# Patient Record
Sex: Male | Born: 1995
Health system: Southern US, Community
[De-identification: ages and names within clinical notes are randomized; demographics above are authoritative.]

## PROBLEM LIST (undated history)

## (undated) DIAGNOSIS — T7840XA Allergy, unspecified, initial encounter: Secondary | ICD-10-CM

## (undated) DIAGNOSIS — R002 Palpitations: Secondary | ICD-10-CM

## (undated) DIAGNOSIS — B2790 Infectious mononucleosis, unspecified without complication: Secondary | ICD-10-CM

## (undated) DIAGNOSIS — B20 Human immunodeficiency virus [HIV] disease: Secondary | ICD-10-CM

## (undated) DIAGNOSIS — J351 Hypertrophy of tonsils: Secondary | ICD-10-CM

## (undated) DIAGNOSIS — Z21 Asymptomatic human immunodeficiency virus [HIV] infection status: Secondary | ICD-10-CM

## (undated) DIAGNOSIS — E559 Vitamin D deficiency, unspecified: Secondary | ICD-10-CM

## (undated) HISTORY — DX: Asymptomatic human immunodeficiency virus (hiv) infection status: Z21

## (undated) HISTORY — DX: Human immunodeficiency virus (HIV) disease: B20

## (undated) HISTORY — DX: Hypertrophy of tonsils: J35.1

## (undated) HISTORY — DX: Palpitations: R00.2

## (undated) HISTORY — DX: Vitamin D deficiency, unspecified: E55.9

## (undated) HISTORY — DX: Allergy, unspecified, initial encounter: T78.40XA

## (undated) HISTORY — PX: NO PAST SURGERIES: SHX2092

---
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
## 2016-01-09 DIAGNOSIS — J351 Hypertrophy of tonsils: Secondary | ICD-10-CM | POA: Diagnosis not present

## 2016-01-09 DIAGNOSIS — J301 Allergic rhinitis due to pollen: Secondary | ICD-10-CM | POA: Diagnosis not present

## 2016-04-22 ENCOUNTER — Encounter: Payer: BLUE CROSS/BLUE SHIELD | Admitting: Family Medicine

## 2016-04-25 ENCOUNTER — Encounter: Payer: Self-pay | Admitting: Family Medicine

## 2016-04-25 ENCOUNTER — Ambulatory Visit (INDEPENDENT_AMBULATORY_CARE_PROVIDER_SITE_OTHER): Payer: BLUE CROSS/BLUE SHIELD | Admitting: Family Medicine

## 2016-04-25 VITALS — BP 118/72 | HR 98 | Temp 98.5°F | Resp 20 | Ht 70.0 in | Wt 146.3 lb

## 2016-04-25 DIAGNOSIS — Z113 Encounter for screening for infections with a predominantly sexual mode of transmission: Secondary | ICD-10-CM

## 2016-04-25 DIAGNOSIS — Z23 Encounter for immunization: Secondary | ICD-10-CM | POA: Diagnosis not present

## 2016-04-25 DIAGNOSIS — Z0000 Encounter for general adult medical examination without abnormal findings: Secondary | ICD-10-CM

## 2016-04-25 DIAGNOSIS — Z68.41 Body mass index (BMI) pediatric, 5th percentile to less than 85th percentile for age: Secondary | ICD-10-CM

## 2016-04-25 DIAGNOSIS — Z00129 Encounter for routine child health examination without abnormal findings: Principal | ICD-10-CM

## 2016-04-25 DIAGNOSIS — J351 Hypertrophy of tonsils: Secondary | ICD-10-CM

## 2016-04-25 HISTORY — DX: Hypertrophy of tonsils: J35.1

## 2016-04-25 NOTE — Progress Notes (Signed)
Adolescent Well Care Visit Kurt Simpson is a 20 y.o. male who is here for well care.    PCP:  Dennison MascotLemont Morrisey, MD   History was provided by the patient.  Current Issues: Current concerns include  none.   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  Concerns regarding behavior with peers?  no Stressors of note: no  Education: School Name: Medical laboratory scientific officerophomore in [employer] Fall 2017 [employer]  School Grade: Sophomore School performance: doing well; no concerns School Behavior: doing well; no concerns   Confidentiality was discussed with the patient and, if applicable, with caregiver as well. Patient's personal or confidential phone number: [phone]  Tobacco?  no Secondhand smoke exposure?  no Drugs/ETOH?  no  Sexually Active?  no  But thinking about it Pregnancy Prevention: abstinence.   Safe at home, in school & in relationships?  Yes Safe to self?  Yes   Screenings: Patient has a dental home: yes  The patient completed the Rapid Assessment for Adolescent Preventive Services screening questionnaire and the following topics were identified as risk factors and discussed: condom use  In addition, the following topics were discussed as part of anticipatory guidance healthy eating.  PHQ-9 completed and results indicated   Dep[facility] screen [facility] 2/9 11/17/2015 11/14/2015  Decreased Interest 0 0  Down, Depressed, Hopeless 0 0  PHQ - 2 Score 0 0       Physical Exam:  Filed Vitals:   04/25/16 1244  BP: 118/72  Pulse: 98  Temp: 98.5 F (36.9 C)  Resp: 20  Height: 5\' 10"  (1.778 m)  Weight: 146 lb 5 oz (66.367 kg)  SpO2: 98%   BP 118/72 mmHg  Pulse 98  Temp(Src) 98.5 F (36.9 C)  Resp 20  Ht 5\' 10"  (1.778 m)  Wt 146 lb 5 oz (66.367 kg)  BMI 20.99 kg/m2  SpO2 98% Body mass index: body mass index is 20.99 kg/(m^2). Blood pressure percentiles are 34% systolic and 35% diastolic based on 2000 NHANES data. Blood pressure percentile targets: 90: 136/91, 95: 140/95, 99 + 5 mmHg: 153/108.  No exam data present  General Appearance:   alert, oriented, no acute distress  HENT: Normocephalic, no obvious abnormality, conjunctiva clear  Mouth:   Normal appearing teeth, no obvious discoloration, dental caries, or dental caps  Neck:   Supple; thyroid: no enlargement, symmetric, no tenderness/mass/nodules  Chest  Not examined  Lungs:   Clear to auscultation bilaterally, normal work of breathing  Heart:   Regular rate and rhythm, S1 and S2 normal, no murmurs;   Abdomen:   Soft, non-tender, no mass, or organomegaly  GU Tanner stage 5  Musculoskeletal:   Tone and strength strong and symmetrical, all extremities               Lymphatic:   No cervical adenopathy  Skin/Hair/Nails:   Skin warm, dry and intact, no rashes, no bruises or petechiae  Neurologic:   Strength, gait, and coordination normal and  age-appropriate     Assessment and Plan:     BMI is appropriate for age  Hearing screening result:not done Vision screening result: wears glasses  Counseling provided for the following hepatitis A and Tdap  vaccine components  Orders Placed This Encounter  Procedures  . Chlamydia/Gonococcus/Trichomonas, NAA  . Tdap vaccine greater than or equal to 7yo IM  . Hepatitis A vaccine pediatric / adolescent 2 dose IM  . HIV antibody  . RPR     No Follow-up on file.Marland Kitchen  Ruel Favors, MD  1. Well adolescent visit  Discussed  with adolescent  and caregiver the importance of limiting screen time to no more than 2 hours per day, exercise daily for at least 2 hours, eat 6 servings of fruit and vegetables daily, eat tree nuts ( pistachios, pecans , almonds...) one serving every other day, eat fish twice weekly. Read daily. Get involved in school. Have responsibilities  at home. To avoid STI's, practice abstinence, if unable use condoms and stick with one partner.  Discussed importance of contraception if sexually active to avoid unwanted pregnancy.   2. Routine screening for STI (sexually transmitted infection)  - Chlamydia/Gonococcus/Trichomonas, NAA - HIV antibody - RPR  3. Need for Tdap vaccination  - Tdap vaccine greater than or equal to 7yo IM  4. Need for hepatitis A vaccination  - Hepatitis A vaccine pediatric / adolescent 2 dose IM  5. Body mass index, pediatric, 5th percentile to less than 85th percentile for age

## 2016-04-26 LAB — RPR

## 2016-04-26 LAB — HIV ANTIBODY (ROUTINE TESTING W REFLEX): HIV: NONREACTIVE

## 2016-04-27 LAB — CHLAMYDIA/GONOCOCCUS/TRICHOMONAS, NAA
CHLAMYDIA BY NAA: NEGATIVE
GONOCOCCUS BY NAA: NEGATIVE
TRICH VAG BY NAA: NEGATIVE

## 2016-07-10 ENCOUNTER — Ambulatory Visit (INDEPENDENT_AMBULATORY_CARE_PROVIDER_SITE_OTHER): Payer: BLUE CROSS/BLUE SHIELD

## 2016-07-10 DIAGNOSIS — Z23 Encounter for immunization: Secondary | ICD-10-CM | POA: Diagnosis not present

## 2016-12-21 ENCOUNTER — Telehealth: Payer: BLUE CROSS/BLUE SHIELD | Admitting: Physician Assistant

## 2016-12-21 ENCOUNTER — Ambulatory Visit
Admission: EM | Admit: 2016-12-21 | Discharge: 2016-12-21 | Disposition: A | Discharge status: Home / Self Care | Payer: 59 | Attending: Emergency Medicine | Admitting: Emergency Medicine

## 2016-12-21 DIAGNOSIS — J039 Acute tonsillitis, unspecified: Secondary | ICD-10-CM | POA: Diagnosis not present

## 2016-12-21 DIAGNOSIS — J029 Acute pharyngitis, unspecified: Secondary | ICD-10-CM

## 2016-12-21 DIAGNOSIS — K1379 Other lesions of oral mucosa: Secondary | ICD-10-CM

## 2016-12-21 LAB — RAPID STREP SCREEN (MED CTR MEBANE ONLY): STREPTOCOCCUS, GROUP A SCREEN (DIRECT): NEGATIVE

## 2016-12-21 MED ORDER — IBUPROFEN 600 MG PO TABS: 600.0000 mg | ORAL_TABLET | Freq: Four times a day (QID) | ORAL | 0 refills | Status: DC | PRN

## 2016-12-21 MED ORDER — MAGIC MOUTHWASH W/LIDOCAINE: 10.0000 mL | Freq: Three times a day (TID) | ORAL | 0 refills | Status: DC | PRN

## 2016-12-21 MED ORDER — AMOXICILLIN-POT CLAVULANATE 875-125 MG PO TABS: 1.0000 | ORAL_TABLET | Freq: Two times a day (BID) | ORAL | 0 refills | Status: DC

## 2016-12-21 NOTE — ED Triage Notes (Signed)
Patient complains of sore throat x 5 days. Patient states that he has noticed swelling and white patches. Patient states that he had mono last year and was told he may need his tonsils out.

## 2016-12-21 NOTE — ED Provider Notes (Signed)
HPI  SUBJECTIVE:  Kurt Simpson is a 21 y.o. male who presents with 1 week of a right-sided sore throat. He states the left side is fine. He noticed white patches for 5 days and states that he started spitting up blood this morning. He tried Advil 800 mg and NyQuil with improvement in his symptoms, symptoms are worse with swallowing and breathing in cold air. He denies fevers, nasal congestion, rhinorrhea, postnasal drip, cough. No fatigue, body aches, headaches, rash. No drooling, trismus, voice changes, difficulty breathing or sensation of his throat swelling shut. No abdominal pain, contacts or strep or mono. No allergy or GERD type symptoms. Denies trauma to his throat or swelling sharp objects. No hemoptysis, GI bleed, epistaxis. No antibiotics in the past month. No antipyretic in the past 6-8 hours. He has a past medical history of recurrent strep and mono. No history of diabetes, hypertension, coagulopathy. He is not on any antiplatelets or anticoagulants. ZOX:WRUEAV Thana Ates, MD     Past Medical History:  Diagnosis Date  . Allergy   . Palpitations   . Palpitations   . Vitamin D deficiency     Past Surgical History:  Procedure Laterality Date  . NO PAST SURGERIES      Family History  Problem Relation Age of Onset  . Healthy Mother   . Migraines Mother   . Healthy Father     Social History  Substance Use Topics  . Smoking status: Never Smoker  . Smokeless tobacco: Never Used  . Alcohol use No    No current facility-administered medications for this encounter.   Current Outpatient Prescriptions:  .  amoxicillin-clavulanate (AUGMENTIN) 875-125 MG tablet, Take 1 tablet by mouth 2 (two) times daily., Disp: 20 tablet, Rfl: 0 .  ibuprofen (ADVIL,MOTRIN) 600 MG tablet, Take 1 tablet (600 mg total) by mouth every 6 (six) hours as needed., Disp: 30 tablet, Rfl: 0 .  magic mouthwash w/lidocaine SOLN, Take 10 mLs by mouth 3 (three) times daily as needed for mouth pain. Swish and  spit do not swallow, Disp: 200 mL, Rfl: 0  No Known Allergies   ROS  As noted in HPI.   Physical Exam  BP 118/70 (BP Location: Left Arm)   Pulse 81   Temp 98.1 F (36.7 C) (Oral)   Resp 18   Ht 5' 9.5" (1.765 m)   Wt 145 lb (65.8 kg)   SpO2 100%   BMI 21.11 kg/m   Constitutional: Well developed, well nourished, no acute distress Eyes:  EOMI, conjunctiva normal bilaterally HENT: Normocephalic, atraumatic,mucus membranes moist. No nasal congestion, normal turbinates. No epistaxis. Positive enlarged tonsils with no exudates. Patient has a large clot surrounding his right tonsil. This is movable with a Q-tip. There does not appear to be any active brisk bleeding. He has erythema and tenderness along the right sided soft palate. No swelling. uvula is midline  Neck: Positive cervical lymphadenopathy Respiratory: Normal inspiratory effort Cardiovascular: Normal rate regular rhythm no murmurs rubs gallops GI: nondistended no palpable spleen  skin: No rash, skin intact Musculoskeletal: no deformities Neurologic: Alert & oriented x 3, no focal neuro deficits Psychiatric: Speech and behavior appropriate   ED Course   Medications - No data to display  Orders Placed This Encounter  Procedures  . Rapid strep screen    Standing Status:   Standing    Number of Occurrences:   1    Order Specific Question:   Patient immune status    Answer:   Normal  .  Culture, group A strep    Standing Status:   Standing    Number of Occurrences:   1    Results for orders placed or performed during the hospital encounter of 12/21/16 (from the past 24 hour(s))  Rapid strep screen     Status: None   Collection Time: 12/21/16 12:17 PM  Result Value Ref Range   Streptococcus, Group A Screen (Direct) NEGATIVE NEGATIVE   No results found.  ED Clinical Impression  Tonsillitis  Oral bleeding   ED Assessment/Plan  Rapid strep negative. Discussed case with Dr. Elenore RotaJuengel, ENT on call. Patient  seems to have some peri tonsillar bleeding from an unknown cause. Could be an erosion or bleeding from a dislodged tonsillith. His airway is widely patent. He does have tenderness along the soft palate but his uvula is midline so while he may have an early cellulitis, doubt peritonsillar abscess or retropharyngeal abscess at this time. We'll send the patient home with ibuprofen 600 mg with 1 g of Tylenol 3-4 times a day, Augmentin 875 twice a day for 10 days, gentle salt water gargles and follow-up with Dr. Elenore RotaJuengel next week.,   Discussed labs, MDM, plan and followup with patient. Discussed sn/sx that should prompt return to the ED. Patient agrees with plan.   Meds ordered this encounter  Medications  . amoxicillin-clavulanate (AUGMENTIN) 875-125 MG tablet    Sig: Take 1 tablet by mouth 2 (two) times daily.    Dispense:  20 tablet    Refill:  0  . ibuprofen (ADVIL,MOTRIN) 600 MG tablet    Sig: Take 1 tablet (600 mg total) by mouth every 6 (six) hours as needed.    Dispense:  30 tablet    Refill:  0  . magic mouthwash w/lidocaine SOLN    Sig: Take 10 mLs by mouth 3 (three) times daily as needed for mouth pain. Swish and spit do not swallow    Dispense:  200 mL    Refill:  0    *This clinic note was created using Scientist, clinical (histocompatibility and immunogenetics)Dragon dictation software. Therefore, there may be occasional mistakes despite careful proofreading.  ?   Domenick GongAshley Sita Mangen, MD 12/21/16 1340

## 2016-12-21 NOTE — Discharge Instructions (Signed)
Try some gentle salt water gargles. We are treating as if this is an infection with the Augmentin for the next 10 days. Follow-up with Dr.Juengel at some point next week. He is not in the office Thursday. He will check and make sure that this has not progressed to a more serious infection. Go immediately to the ER if you have difficulty breathing, sensation of throat swelling shut, bleeding that won't stop, or other concerns

## 2016-12-21 NOTE — Progress Notes (Signed)
Based on what you shared with me it looks like you have a condition that should be evaluated in a face to face office visit. We do not treat pharyngitis via e-visits. I do agree that there is a potential for strep here. Other etiologies include sore throat from drainage and viral pharyngitis. You need an examination with strep testing for accurate diagnosis and treatment. Please be seen today at an urgent care.     NOTE: Even if you have entered your credit card information for this eVisit, you will not be charged.   If you are having a true medical emergency please call 911.  If you need an urgent face to face visit, [place] has four urgent care centers for your convenience.  If you need care fast and have a high deductible or no insurance consider:   [email or web address]  [phone]  [address], [address] [place], [place] [address] 8 am to 8 pm Monday-Friday 10 am to 4 pm Saturday-Sunday   The following sites will take your  insurance:    . [facility]  [phone] Get Driving Directions Find a Provider at this Location  [address] [place], [place] [address] . 10 am to 8 pm Monday-Friday . 12 pm to 8 pm Saturday-Sunday   . [facility]  [phone] Get Driving Directions Find a Provider at this Location  [address], [address] [place], [place] [address] . 8 am to 8 pm Monday-Friday . 9 am to 6 pm Saturday . 11 am to 6 pm Sunday   . [facility] [place]  [phone] Get Driving Directions  [id number] [place], [place] [address] . 8 am to 8 pm Monday-Friday . 8 am to 4 pm Saturday-Sunday   Your e-visit answers were reviewed by a board certified advanced clinical practitioner to complete your personal care plan.  Thank you for using e-Visits.

## 2016-12-23 LAB — CULTURE, GROUP A STREP (THRC)

## 2017-07-07 ENCOUNTER — Encounter: Payer: Self-pay | Admitting: Family Medicine

## 2017-07-07 ENCOUNTER — Ambulatory Visit (INDEPENDENT_AMBULATORY_CARE_PROVIDER_SITE_OTHER): Payer: BLUE CROSS/BLUE SHIELD | Admitting: Family Medicine

## 2017-07-07 VITALS — BP 100/50 | HR 90 | Temp 98.5°F | Resp 12 | Ht 69.29 in | Wt 143.0 lb

## 2017-07-07 DIAGNOSIS — Z113 Encounter for screening for infections with a predominantly sexual mode of transmission: Secondary | ICD-10-CM

## 2017-07-07 DIAGNOSIS — Z0000 Encounter for general adult medical examination without abnormal findings: Secondary | ICD-10-CM

## 2017-07-07 DIAGNOSIS — Z23 Encounter for immunization: Secondary | ICD-10-CM

## 2017-07-07 NOTE — Patient Instructions (Signed)
Preventive Care 18-39 Years, Male Preventive care refers to lifestyle choices and visits with your health care provider that can promote health and wellness. What does preventive care include?  A yearly physical exam. This is also called an annual well check.  Dental exams once or twice a year.  Routine eye exams. Ask your health care provider how often you should have your eyes checked.  Personal lifestyle choices, including: ? Daily care of your teeth and gums. ? Regular physical activity. ? Eating a healthy diet. ? Avoiding tobacco and drug use. ? Limiting alcohol use. ? Practicing safe sex. What happens during an annual well check? The services and screenings done by your health care provider during your annual well check will depend on your age, overall health, lifestyle risk factors, and family history of disease. Counseling Your health care provider may ask you questions about your:  Alcohol use.  Tobacco use.  Drug use.  Emotional well-being.  Home and relationship well-being.  Sexual activity.  Eating habits.  Work and work environment.  Screening You may have the following tests or measurements:  Height, weight, and BMI.  Blood pressure.  Lipid and cholesterol levels. These may be checked every 5 years starting at age 20.  Diabetes screening. This is done by checking your blood sugar (glucose) after you have not eaten for a while (fasting).  Skin check.  Hepatitis C blood test.  Hepatitis B blood test.  Sexually transmitted disease (STD) testing.  Discuss your test results, treatment options, and if necessary, the need for more tests with your health care provider. Vaccines Your health care provider may recommend certain vaccines, such as:  Influenza vaccine. This is recommended every year.  Tetanus, diphtheria, and acellular pertussis (Tdap, Td) vaccine. You may need a Td booster every 10 years.  Varicella vaccine. You may need this if you  have not been vaccinated.  HPV vaccine. If you are 26 or younger, you may need three doses over 6 months.  Measles, mumps, and rubella (MMR) vaccine. You may need at least one dose of MMR.You may also need a second dose.  Pneumococcal 13-valent conjugate (PCV13) vaccine. You may need this if you have certain conditions and have not been vaccinated.  Pneumococcal polysaccharide (PPSV23) vaccine. You may need one or two doses if you smoke cigarettes or if you have certain conditions.  Meningococcal vaccine. One dose is recommended if you are age 19-21 years and a first-year college student living in a residence hall, or if you have one of several medical conditions. You may also need additional booster doses.  Hepatitis A vaccine. You may need this if you have certain conditions or if you travel or work in places where you may be exposed to hepatitis A.  Hepatitis B vaccine. You may need this if you have certain conditions or if you travel or work in places where you may be exposed to hepatitis B.  Haemophilus influenzae type b (Hib) vaccine. You may need this if you have certain risk factors.  Talk to your health care provider about which screenings and vaccines you need and how often you need them. This information is not intended to replace advice given to you by your health care provider. Make sure you discuss any questions you have with your health care provider. Document Released: 11/19/2001 Document Revised: 06/12/2016 Document Reviewed: 07/25/2015 Elsevier Interactive Patient Education  2017 Elsevier Inc.  

## 2017-07-07 NOTE — Progress Notes (Signed)
Name: Jocelyn Lowery   MRN: [id number]    DOB: [birth year]   Date:07/07/2017       Progress Note  Subjective  Chief Complaint  Chief Complaint  Patient presents with  . Annual Exam    HPI  Male exam: he has been sexually active in the past but not currently, he uses condoms, denies penile discharge, dysuria or hematuria. No change in bowel movement of blood in stools. PHQ 9 was normal. He has a poor sleep pattern, discussed living cell phone our of his bedroom at night, and have a good sleep hygiene. He is willing to have STI screen, but does not want any other labs today. He is due for flu and meningococcal vaccine   Patient Active Problem List   Diagnosis Date Noted  . Enlarged tonsils 04/25/2016  . Hive 11/14/2015  . Vitamin D deficiency 11/25/2009  . Allergic rhinitis 01/17/2009    Past Surgical History:  Procedure Laterality Date  . NO PAST SURGERIES      Family History  Problem Relation Age of Onset  . Healthy Mother   . Migraines Mother   . Healthy Father     Social History   Social History  . Marital status: Single    Spouse name: N/A  . Number of children: N/A  . Years of education: N/A   Occupational History  .      call center   Social History Main Topics  . Smoking status: Never Smoker  . Smokeless tobacco: Never Used  . Alcohol use No     Comment: experimented   . Drug use: No  . Sexual activity: Not Currently    Birth control/ protection: Condom   Other Topics Concern  . Not on file   Social History Narrative   He lives with his mother, step-dad , younger sister.    He was taking classes a [employer], but has taken some time off. He plans on going back   He is working at [employer] center , Museum/gallery curator    No current outpatient prescriptions on file.  No Known Allergies   ROS  Constitutional: Negative for fever or weight change.  Respiratory: Negative for cough and shortness of breath.   Cardiovascular: Negative for  chest pain or palpitations.  Gastrointestinal: Negative for abdominal pain, no bowel changes.  Musculoskeletal: Negative for gait problem or joint swelling.  Skin: Negative for rash.  Neurological: Negative for dizziness or headache.  No other specific complaints in a complete review of systems (except as listed in HPI above).  Objective  Vitals:   07/07/17 1413  BP: (!) 100/50  Pulse: 90  Resp: 12  Temp: 98.5 F (36.9 C)  TempSrc: Oral  SpO2: 99%  Weight: 143 lb (64.9 kg)  Height: 5' 9.29" (1.76 m)    Body mass index is 20.94 kg/m.  Physical Exam  Constitutional: Patient appears well-developed and well-nourished. No distress.  HENT: Head: Normocephalic and atraumatic. Ears: B TMs ok, no erythema or effusion; Nose: Nose normal. Mouth/Throat: Oropharynx is clear and moist. No oropharyngeal exudate.  Eyes: Conjunctivae and EOM are normal. Pupils are equal, round, and reactive to light. No scleral icterus.  Neck: Normal range of motion. Neck supple. No JVD present. No thyromegaly present.  Cardiovascular: Normal rate, regular rhythm and normal heart sounds.  No murmur heard. No BLE edema. Pulmonary/Chest: Effort normal and breath sounds normal. No respiratory distress. Abdominal: Soft. Bowel sounds are normal, no distension. There is no tenderness.  no masses MALE GENITALIA: Normal descended testes bilaterally, no masses palpated, no hernias, no lesions, no discharge RECTAL: Prostate normal size and consistency, no rectal masses or hemorrhoids Musculoskeletal: Normal range of motion, no joint effusions. No gross deformities Neurological: he is alert and oriented to person, place, and time. No cranial nerve deficit. Coordination, balance, strength, speech and gait are normal.  Skin: Skin is warm and dry. No rash noted. No erythema.  Psychiatric: Patient has a normal mood and affect. behavior is normal. Judgment and thought content normal.   PHQ2/9: Depression screen [facility] 2/9  07/07/2017 11/17/2015 11/14/2015  Decreased Interest 0 0 0  Down, Depressed, Hopeless 1 0 0  PHQ - 2 Score 1 0 0  Altered sleeping 0 - -  Tired, decreased energy 1 - -  Change in appetite 0 - -  Feeling bad or failure about yourself  0 - -  Trouble concentrating 0 - -  Moving slowly or fidgety/restless 0 - -  Suicidal thoughts 0 - -  PHQ-9 Score 2 - -  Difficult doing work/chores Not difficult at all - -    Fall Risk: Fall Risk  11/17/2015 11/14/2015  Falls in the past year? No No     Functional Status Survey: Is the patient deaf or have difficulty hearing?: No Does the patient have difficulty seeing, even when wearing glasses/contacts?: No Does the patient have difficulty concentrating, remembering, or making decisions?: No Does the patient have difficulty walking or climbing stairs?: No Does the patient have difficulty dressing or bathing?: No Does the patient have difficulty doing errands alone such as visiting a doctor's office or shopping?: No    Assessment & Plan  1. Encounter for routine history and physical exam for male  Discussed importance of 150 minutes of physical activity weekly, eat two servings of fish weekly, eat one serving of tree nuts ( cashews, pistachios, pecans, almonds.Marland Kitchen) every other day, eat 6 servings of fruit/vegetables daily and drink plenty of water and avoid sweet beverages.   2. Influenza vaccine needed  - Flu Vaccine QUAD 36+ mos IM  3. Routine screening for STI (sexually transmitted infection)  - RPR - HIV antibody - GC/Chlamydia Probe Amp  4. Need for vaccination for meningococcus  - Meningococcal MCV4O(Menveo)

## 2017-07-08 LAB — HIV ANTIBODY (ROUTINE TESTING W REFLEX): HIV: NONREACTIVE

## 2017-07-08 LAB — C. TRACHOMATIS/N. GONORRHOEAE RNA
C. TRACHOMATIS RNA, TMA: NOT DETECTED
N. GONORRHOEAE RNA, TMA: NOT DETECTED

## 2017-07-08 LAB — RPR: RPR: NONREACTIVE

## 2017-08-25 ENCOUNTER — Encounter: Payer: BLUE CROSS/BLUE SHIELD | Admitting: Family Medicine

## 2018-04-13 ENCOUNTER — Ambulatory Visit (INDEPENDENT_AMBULATORY_CARE_PROVIDER_SITE_OTHER): Payer: No Typology Code available for payment source | Admitting: Family Medicine

## 2018-04-13 ENCOUNTER — Encounter: Payer: Self-pay | Admitting: Family Medicine

## 2018-04-13 ENCOUNTER — Other Ambulatory Visit (HOSPITAL_COMMUNITY)
Admission: RE | Admit: 2018-04-13 | Discharge: 2018-04-13 | Disposition: A | Discharge status: Home / Self Care | Payer: BLUE CROSS/BLUE SHIELD | Source: Ambulatory Visit | Attending: Family Medicine | Admitting: Family Medicine

## 2018-04-13 VITALS — BP 118/70 | HR 99 | Temp 98.3°F | Resp 18 | Ht 69.0 in | Wt 142.9 lb

## 2018-04-13 DIAGNOSIS — Z113 Encounter for screening for infections with a predominantly sexual mode of transmission: Secondary | ICD-10-CM

## 2018-04-13 DIAGNOSIS — Z114 Encounter for screening for human immunodeficiency virus [HIV]: Secondary | ICD-10-CM | POA: Diagnosis not present

## 2018-04-13 NOTE — Progress Notes (Signed)
Name: Kurt Simpson   MRN: [id number]    DOB: 1996/06/19   Date:04/13/2018       Progress Note  Subjective  Chief Complaint  Chief Complaint  Patient presents with  . STD Screening    HPI  PT presents for STD screening. He has not been sexually active in a few months; is thinking of becoming sexually active soon and would like to have baseline testing first.  He denies penile discharge, pain, or lesions. Discussed condom use at length with patient.  Patient Active Problem List   Diagnosis Date Noted  . Enlarged tonsils 04/25/2016  . Hive 11/14/2015  . Vitamin D deficiency 11/25/2009  . Allergic rhinitis 01/17/2009    Social History   Tobacco Use  . Smoking status: Never Smoker  . Smokeless tobacco: Never Used  Substance Use Topics  . Alcohol use: No    Alcohol/week: 0.0 oz    Comment: experimented     No current outpatient medications on file.  No Known Allergies  ROS  Constitutional: Negative for fever or weight change.  Respiratory: Negative for cough and shortness of breath.   Cardiovascular: Negative for chest pain or palpitations.  Gastrointestinal: Negative for abdominal pain, no bowel changes.  Musculoskeletal: Negative for gait problem or joint swelling.  Skin: Negative for rash.  Neurological: Negative for dizziness or headache.  No other specific complaints in a complete review of systems (except as listed in HPI above).  Objective  Vitals:   04/13/18 1442  BP: 118/70  Pulse: 99  Resp: 18  Temp: 98.3 F (36.8 C)  TempSrc: Oral  SpO2: 94%  Weight: 142 lb 14.4 oz (64.8 kg)  Height: 5\' 9"  (1.753 m)   Body mass index is 21.1 kg/m.  Nursing Note and Vital Signs reviewed.  Physical Exam  Constitutional: Patient appears well-developed and well-nourished.  No distress.  HEENT: head atraumatic, normocephalic Cardiovascular: Normal rate, regular rhythm, S1/S2 present.  No murmur or rub heard. No BLE edema. Pulmonary/Chest: Effort normal and  breath sounds clear. No respiratory distress or retractions. Psychiatric: Patient has a normal mood and affect. behavior is normal. Judgment and thought content normal.  No results found for this or any previous visit (from the past 72 hour(s)).  Assessment & Plan  1. Screen for STD (sexually transmitted disease) - C. trachomatis/N. gonorrhoeae RNA - HIV antibody - RPR - To avoid STI's, practice abstinence, if unable use condoms and stick with one partner.  Discussed importance of contraception if sexually active to avoid unwanted pregnancy.   2. Encounter for screening for HIV - HIV antibody

## 2018-04-13 NOTE — Addendum Note (Signed)
Addended by: Doren CustardBOYCE, EMILY E on: 04/13/2018 03:20 PM   Modules accepted: Orders

## 2018-04-14 LAB — HIV ANTIBODY (ROUTINE TESTING W REFLEX): HIV 1&2 Ab, 4th Generation: NONREACTIVE

## 2018-04-14 LAB — RPR: RPR Ser Ql: NONREACTIVE

## 2018-04-14 LAB — URINE CYTOLOGY ANCILLARY ONLY
CHLAMYDIA, DNA PROBE: NEGATIVE
NEISSERIA GONORRHEA: NEGATIVE

## 2018-05-05 ENCOUNTER — Encounter: Payer: Self-pay | Admitting: Family Medicine

## 2018-05-05 ENCOUNTER — Ambulatory Visit (INDEPENDENT_AMBULATORY_CARE_PROVIDER_SITE_OTHER): Payer: 59 | Admitting: Family Medicine

## 2018-05-05 VITALS — BP 110/68 | HR 89 | Temp 98.4°F | Resp 20 | Ht 69.0 in | Wt 141.4 lb

## 2018-05-05 DIAGNOSIS — J039 Acute tonsillitis, unspecified: Secondary | ICD-10-CM

## 2018-05-05 MED ORDER — AMOXICILLIN 500 MG PO TABS: 500.0000 mg | ORAL_TABLET | Freq: Two times a day (BID) | ORAL | 0 refills | Status: AC

## 2018-05-05 NOTE — Patient Instructions (Signed)
Tonsillitis Tonsillitis is an infection of the throat. This infection causes the tonsils to become red, tender, and swollen. Tonsils are tissues in the back of your throat. If bacteria caused your infection, antibiotic medicine will be given to you. Sometimes, symptoms of this infection can be helped with the use of steroid medicine. If your tonsillitis is very bad (severe) and happens often, you may need to get your tonsils removed (tonsillectomy). Follow these instructions at home: Medicines  Take over-the-counter and prescription medicines only as told by your doctor.  If you were prescribed an antibiotic, take it as told by your doctor. Do not stop taking the antibiotic even if you start to feel better. Eating and drinking  Drink enough fluid to keep your pee (urine) clear or pale yellow.  While your throat is sore, eat soft or liquid foods like: ? Soup. ? Sherbert. ? Instant breakfast drinks.  Drink warm fluids.  Eat frozen ice pops. General instructions  Rest as much as possible and get plenty of sleep.  Gargle with a salt-water mixture 3-4 times a day or as needed. To make a salt-water mixture, completely dissolve -1 tsp of salt in 1 cup of warm water.  Wash your hands often with soap and water. If there is no soap and water, use hand sanitizer.  Do not share cups, bottles, or other utensils until your symptoms are gone.  Do not smoke. If you need help quitting, ask your doctor.  Keep all follow-up visits as told by your doctor. This is important. Contact a doctor if:  You have large, tender lumps in your neck.  You have a fever that does not go away after 2-3 days.  You have a rash.  You cough up green, yellow-brown, or bloody fluid.  You cannot swallow liquids or food for 24 hours.  Only one of your tonsils is swollen. Get help right away if:  You have any new symptoms such as: ? Vomiting ? Very bad headache ? Stiff neck ? Chest pain ? Trouble breathing  or swallowing  You have very bad throat pain and also have drooling or voice changes.  You have very bad pain that is not helped by medicine.  You cannot fully open your mouth.  You have redness, swelling, or severe pain anywhere in your neck. Summary  Tonsillitis causes your tonsils to be red, tender, and swollen.  While your throat is sore eat soft or liquid foods.  Gargle with a salt-water mixture 3-4 times a day or as needed.  Do not share cups, bottles, or other utensils until your symptoms are gone. This information is not intended to replace advice given to you by your health care provider. Make sure you discuss any questions you have with your health care provider. Document Released: 03/11/2008 Document Revised: 02/29/2016 Document Reviewed: 03/12/2013 Elsevier Interactive Patient Education  2017 Elsevier Inc.  

## 2018-05-05 NOTE — Progress Notes (Signed)
Name: Kurt Simpson   MRN: [id number]    DOB: Aug 12, 1996   Date:05/05/2018       Progress Note  Subjective  Chief Complaint  Chief Complaint  Patient presents with  . Sinusitis    for 2 days  . Sore Throat    HPI  Pt prsents with concern for sore throat x2 days - did have sinus congestion 7 days ago but this started to improve; has history of mono with swollen tonsils but says this feels different. Denies difficulty swallowing, cough, chest pain, shortness of breath, ear pain/pressure, chills, fevers, nausea, vomiting, diarrhea, or body aches.  Has tried dayquil, nyquil, and mucinex without relief.  Patient Active Problem List   Diagnosis Date Noted  . Enlarged tonsils 04/25/2016  . Hive 11/14/2015  . Vitamin D deficiency 11/25/2009  . Allergic rhinitis 01/17/2009    Social History   Tobacco Use  . Smoking status: Never Smoker  . Smokeless tobacco: Never Used  Substance Use Topics  . Alcohol use: No    Alcohol/week: 0.0 oz    Comment: experimented     No current outpatient medications on file.  No Known Allergies  ROS  Ten systems reviewed and is negative except as mentioned in HPI  Objective  Vitals:   05/05/18 0948  BP: 110/68  Pulse: 89  Resp: 20  Temp: 98.4 F (36.9 C)  TempSrc: Oral  SpO2: 96%  Weight: 141 lb 6.4 oz (64.1 kg)  Height: 5\' 9"  (1.753 m)   Body mass index is 20.88 kg/m.  Nursing Note and Vital Signs reviewed.  Physical Exam  Constitutional: He is oriented to person, place, and time. He appears well-developed and well-nourished.  HENT:  Head: Normocephalic and atraumatic.  Right Ear: Tympanic membrane, external ear and ear canal normal.  Left Ear: Tympanic membrane, external ear and ear canal normal.  Nose: Nose normal. Right sinus exhibits no maxillary sinus tenderness and no frontal sinus tenderness. Left sinus exhibits no maxillary sinus tenderness and no frontal sinus tenderness.  Mouth/Throat: Uvula is midline and mucous  membranes are normal. No uvula swelling. Oropharyngeal exudate (small amount white exudate present) present. Tonsils are 2+ on the right. Tonsils are 2+ on the left. Tonsillar exudate (White exudate present; tonsils are notably erythematous).  Eyes: Pupils are equal, round, and reactive to light. Conjunctivae and EOM are normal. No scleral icterus.  Neck: Normal range of motion. Neck supple.  Cardiovascular: Normal rate, regular rhythm and normal heart sounds.  Pulmonary/Chest: Effort normal and breath sounds normal. No respiratory distress. He has no wheezes. He has no rales. He exhibits no tenderness.  Musculoskeletal: Normal range of motion. He exhibits no edema.  Lymphadenopathy:    He has no cervical adenopathy.  Neurological: He is alert and oriented to person, place, and time. No cranial nerve deficit.  Skin: Skin is warm and dry. No rash noted. No erythema.  Psychiatric: He has a normal mood and affect. His behavior is normal. Judgment and thought content normal.  Nursing note and vitals reviewed.  No results found for this or any previous visit (from the past 72 hour(s)).  Assessment & Plan  1. Tonsillitis - amoxicillin (AMOXIL) 500 MG tablet; Take 1 tablet (500 mg total) by mouth 2 (two) times daily for 10 days.  Dispense: 20 tablet; Refill: 0  -Red flags and when to present for emergency care or RTC including fever >101.77F, chest pain, shortness of breath, new/worsening/un-resolving symptoms, muffled voice, difficulty swallowing reviewed with patient  at time of visit. Follow up and care instructions discussed and provided in AVS.

## 2018-05-10 ENCOUNTER — Encounter: Payer: Self-pay | Admitting: Emergency Medicine

## 2018-05-10 ENCOUNTER — Emergency Department
Admission: EM | Admit: 2018-05-10 | Discharge: 2018-05-10 | Disposition: A | Discharge status: Home / Self Care | Payer: 59 | Attending: Emergency Medicine | Admitting: Emergency Medicine

## 2018-05-10 ENCOUNTER — Emergency Department: Payer: 59

## 2018-05-10 DIAGNOSIS — J029 Acute pharyngitis, unspecified: Secondary | ICD-10-CM | POA: Diagnosis present

## 2018-05-10 DIAGNOSIS — J039 Acute tonsillitis, unspecified: Secondary | ICD-10-CM

## 2018-05-10 HISTORY — DX: Infectious mononucleosis, unspecified without complication: B27.90

## 2018-05-10 LAB — COMPREHENSIVE METABOLIC PANEL
ALBUMIN: 4 g/dL (ref 3.5–5.0)
ALT: 44 U/L (ref 0–44)
ANION GAP: 11 (ref 5–15)
AST: 62 U/L — ABNORMAL HIGH (ref 15–41)
Alkaline Phosphatase: 67 U/L (ref 38–126)
BUN: 10 mg/dL (ref 6–20)
CHLORIDE: 99 mmol/L (ref 98–111)
CO2: 25 mmol/L (ref 22–32)
Calcium: 9 mg/dL (ref 8.9–10.3)
Creatinine, Ser: 0.87 mg/dL (ref 0.61–1.24)
GFR calc non Af Amer: >60 mL/min (ref >60)
GLUCOSE: 90 mg/dL (ref 70–99)
POTASSIUM: 3.4 mmol/L — AB (ref 3.5–5.1)
SODIUM: 135 mmol/L (ref 135–145)
Total Bilirubin: 0.5 mg/dL (ref 0.3–1.2)
Total Protein: 8 g/dL (ref 6.5–8.1)

## 2018-05-10 LAB — CBC WITH DIFFERENTIAL/PLATELET
Basophils Absolute: 0 10*3/uL (ref 0–0.1)
Basophils Relative: 0 %
EOS PCT: 0 %
Eosinophils Absolute: 0 10*3/uL (ref 0–0.7)
HCT: 38.4 % — ABNORMAL LOW (ref 40.0–52.0)
Hemoglobin: 13.6 g/dL (ref 13.0–18.0)
LYMPHS ABS: 0.6 10*3/uL — AB (ref 1.0–3.6)
LYMPHS PCT: 10 %
MCH: 26.7 pg (ref 26.0–34.0)
MCHC: 35.4 g/dL (ref 32.0–36.0)
MCV: 75.5 fL — ABNORMAL LOW (ref 80.0–100.0)
MONO ABS: 0.9 10*3/uL (ref 0.2–1.0)
MONOS PCT: 14 %
Neutro Abs: 4.9 10*3/uL (ref 1.4–6.5)
Neutrophils Relative %: 76 %
PLATELETS: 249 10*3/uL (ref 150–440)
RBC: 5.09 MIL/uL (ref 4.40–5.90)
RDW: 15.4 % — AB (ref 11.5–14.5)
WBC: 6.5 10*3/uL (ref 3.8–10.6)

## 2018-05-10 LAB — MONONUCLEOSIS SCREEN: MONO SCREEN: NEGATIVE

## 2018-05-10 LAB — GROUP A STREP BY PCR: Group A Strep by PCR: DETECTED — AB

## 2018-05-10 MED ORDER — OXYCODONE-ACETAMINOPHEN 5-325 MG PO TABS: 1.0000 | ORAL_TABLET | Freq: Three times a day (TID) | ORAL | 0 refills | Status: DC | PRN

## 2018-05-10 MED ORDER — GI COCKTAIL ~~LOC~~
30.0000 mL | Freq: Once | ORAL | Status: AC
  Administered 2018-05-10: 30 mL via ORAL
  Filled 2018-05-10: qty 30

## 2018-05-10 MED ORDER — SODIUM CHLORIDE 0.9 % IV SOLN
Freq: Once | INTRAVENOUS | Status: AC
  Administered 2018-05-10: 20:00:00 via INTRAVENOUS

## 2018-05-10 MED ORDER — LIDOCAINE VISCOUS HCL 2 % MT SOLN: 15.0000 mL | Freq: Four times a day (QID) | OROMUCOSAL | 0 refills | Status: DC | PRN

## 2018-05-10 MED ORDER — IOHEXOL 300 MG/ML  SOLN
75.0000 mL | Freq: Once | INTRAMUSCULAR | Status: AC | PRN
  Administered 2018-05-10: 75 mL via INTRAVENOUS
  Filled 2018-05-10: qty 75

## 2018-05-10 MED ORDER — KETOROLAC TROMETHAMINE 30 MG/ML IJ SOLN
30.0000 mg | Freq: Once | INTRAMUSCULAR | Status: AC
  Administered 2018-05-10: 30 mg via INTRAVENOUS
  Filled 2018-05-10: qty 1

## 2018-05-10 MED ORDER — SODIUM CHLORIDE 0.9 % IV SOLN
3.0000 g | Freq: Once | INTRAVENOUS | Status: AC
  Administered 2018-05-10: 3 g via INTRAVENOUS
  Filled 2018-05-10 (×2): qty 3

## 2018-05-10 MED ORDER — DEXAMETHASONE SODIUM PHOSPHATE 10 MG/ML IJ SOLN
10.0000 mg | Freq: Once | INTRAMUSCULAR | Status: AC
  Administered 2018-05-10: 10 mg via INTRAVENOUS
  Filled 2018-05-10: qty 1

## 2018-05-10 MED ORDER — AMOXICILLIN-POT CLAVULANATE ER 1000-62.5 MG PO TB12: 1.0000 | ORAL_TABLET | Freq: Two times a day (BID) | ORAL | 0 refills | Status: AC

## 2018-05-10 MED ORDER — HYDROMORPHONE HCL 1 MG/ML IJ SOLN
0.5000 mg | Freq: Once | INTRAMUSCULAR | Status: AC
  Administered 2018-05-10: 0.5 mg via INTRAVENOUS
  Filled 2018-05-10: qty 1

## 2018-05-10 MED ORDER — PREDNISONE 10 MG (21) PO TBPK: ORAL_TABLET | Freq: Every day | ORAL | 0 refills | Status: DC

## 2018-05-10 NOTE — ED Notes (Signed)
Sig pad inop 

## 2018-05-10 NOTE — ED Provider Notes (Signed)
[facility] Emergency Department Provider Note       Time seen: ----------------------------------------- 7:14 PM on 05/10/2018 -----------------------------------------   I have reviewed the triage vital signs and the nursing notes.  HISTORY   Chief Complaint Sore Throat    HPI Kurt Simpson is a 22 y.o. male with a history of mono, allergies who presents to the ED for sore throat for the last 2 weeks.  Patient states he saw his primary care doctor a week ago and has been on antibiotics for the last week.  He has pain and swelling in his throat that has become worse.  She states he has been taking antibiotics as prescribed without any improvement.  The only thing that helps his pain is ice chips.  Past Medical History:  Diagnosis Date  . Allergy   . Mononucleosis   . Palpitations   . Palpitations   . Vitamin D deficiency     Patient Active Problem List   Diagnosis Date Noted  . Enlarged tonsils 04/25/2016  . Hive 11/14/2015  . Vitamin D deficiency 11/25/2009  . Allergic rhinitis 01/17/2009    Past Surgical History:  Procedure Laterality Date  . NO PAST SURGERIES      Allergies Patient has no known allergies.  Social History Social History   Tobacco Use  . Smoking status: Never Smoker  . Smokeless tobacco: Never Used  Substance Use Topics  . Alcohol use: No    Alcohol/week: 0.0 oz    Comment: experimented   . Drug use: No   Review of Systems Constitutional: Negative for fever. ENT: Positive for sore throat Cardiovascular: Negative for chest pain. Respiratory: Negative for shortness of breath. Gastrointestinal: Negative for abdominal pain, vomiting and diarrhea. Musculoskeletal: Negative for back pain. Skin: Negative for rash. Neurological: Negative for headaches, focal weakness or numbness.  All systems negative/normal/unremarkable except as stated in the HPI  ____________________________________________   PHYSICAL  EXAM:  VITAL SIGNS: ED Triage Vitals [05/10/18 1911]  Enc Vitals Group     BP 114/63     Pulse Rate (!) 109     Resp 18     Temp (!) 102.3 F (39.1 C)     Temp src      SpO2 94 %     Weight 141 lb (64 kg)     Height 5\' 9"  (1.753 m)     Head Circumference      Peak Flow      Pain Score 10     Pain Loc      Pain Edu?      Excl. in GC?    Constitutional: Alert and oriented. Well appearing and in no distress. Eyes: Conjunctivae are normal. Normal extraocular movements. ENT   Head: Normocephalic and atraumatic.   Nose: No congestion/rhinnorhea.   Mouth/Throat: Extensive posterior pharyngeal erythema with mild tonsillar hypertrophy and thick white exudate and scattered patches   Neck: No stridor. Cardiovascular: Normal rate, regular rhythm. No murmurs, rubs, or gallops. Respiratory: Normal respiratory effort without tachypnea nor retractions. Breath sounds are clear and equal bilaterally. No wheezes/rales/rhonchi. Gastrointestinal: Soft and nontender. Normal bowel sounds Musculoskeletal: Nontender with normal range of motion in extremities. No lower extremity tenderness nor edema. Neurologic:  Normal speech and language. No gross focal neurologic deficits are appreciated.  Skin:  Skin is warm, dry and intact. No rash noted. Psychiatric: Mood and affect are normal. Speech and behavior are normal.  ____________________________________________  ED COURSE:  As part of my medical decision  making, I reviewed the following data within the electronic MEDICAL RECORD NUMBER History obtained from family if available, nursing notes, old chart and ekg, as well as notes from prior ED visits. Patient presented for sore throat, we will assess with labs and imaging as indicated at this time. Clinical Course as of May 10 2136  Kurt Simpson May 10, 2018  2121 Group A Strep by PCR(!): DETECTED [JW]    Clinical Course User Index [JW] Emily Filbert, MD    Procedures ____________________________________________   LABS (pertinent positives/negatives)  Labs Reviewed  GROUP A STREP BY PCR - Abnormal; Notable for the following components:      Result Value   Group A Strep by PCR DETECTED (*)    All other components within normal limits  CBC WITH DIFFERENTIAL/PLATELET - Abnormal; Notable for the following components:   HCT 38.4 (*)    MCV 75.5 (*)    RDW 15.4 (*)    Lymphs Abs 0.6 (*)    All other components within normal limits  COMPREHENSIVE METABOLIC PANEL - Abnormal; Notable for the following components:   Potassium 3.4 (*)    AST 62 (*)    All other components within normal limits  MONONUCLEOSIS SCREEN    RADIOLOGY Images were viewed by me  CT neck with contrast IMPRESSION: Pharyngitis and tonsillitis pattern without evidence of tonsillar or peritonsillar abscess or retropharyngeal effusion. ____________________________________________  DIFFERENTIAL DIAGNOSIS   Bacterial versus viral pharyngitis, tonsillitis, peritonsillar abscess  FINAL ASSESSMENT AND PLAN  Tonsillitis   Plan: The patient had presented for worsening pharyngitis. Patient's labs were unremarkable with the exception of group A strep by PCR. Patient's imaging did not reveal any peritonsillar abscess but revealed tonsillitis.  He was given IV Decadron as well as Unasyn.  Currently he has had some improvement in his symptoms.  He will be referred to ENT for close outpatient follow-up.   Kurt Dash, MD   Note: This note was generated in part or whole with voice recognition software. Voice recognition is usually quite accurate but there are transcription errors that can and very often do occur. I apologize for any typographical errors that were not detected and corrected.     Emily Filbert, MD 05/10/18 2138

## 2018-05-10 NOTE — ED Triage Notes (Signed)
Patient with complaint of sore throat times two weeks. Patient state that he saw his PCP a week ago and has been an antibiotics times one week. Patient states that the pain and swelling has become worse. Patient with swelling and white patches to the throat.

## 2018-05-10 NOTE — ED Notes (Signed)
Pt with sore throat since Tuesday taking abx already no improvement - cpod EDP already assessed

## 2018-05-11 ENCOUNTER — Ambulatory Visit: Payer: 59 | Admitting: Family Medicine

## 2018-05-11 ENCOUNTER — Other Ambulatory Visit: Payer: Self-pay | Admitting: Family Medicine

## 2018-05-11 DIAGNOSIS — J039 Acute tonsillitis, unspecified: Secondary | ICD-10-CM

## 2018-05-11 NOTE — Progress Notes (Unsigned)
Referral placed for ENT as urgent due to pt not improving - pt stopped by office to state was unable to obtain ENT appointment due to the order not being placed during his ER visit yesterday.

## 2018-05-11 NOTE — Progress Notes (Signed)
Records sent

## 2018-05-13 ENCOUNTER — Encounter: Payer: Self-pay | Admitting: Emergency Medicine

## 2018-05-13 DIAGNOSIS — J0300 Acute streptococcal tonsillitis, unspecified: Secondary | ICD-10-CM | POA: Diagnosis not present

## 2018-05-13 DIAGNOSIS — J3501 Chronic tonsillitis: Secondary | ICD-10-CM | POA: Diagnosis not present

## 2018-07-10 ENCOUNTER — Encounter: Payer: Self-pay | Admitting: Family Medicine

## 2018-07-10 ENCOUNTER — Other Ambulatory Visit (HOSPITAL_COMMUNITY)
Admission: RE | Admit: 2018-07-10 | Discharge: 2018-07-10 | Disposition: A | Discharge status: Home / Self Care | Payer: 59 | Source: Ambulatory Visit | Attending: Family Medicine | Admitting: Family Medicine

## 2018-07-10 ENCOUNTER — Ambulatory Visit (INDEPENDENT_AMBULATORY_CARE_PROVIDER_SITE_OTHER): Payer: 59 | Admitting: Family Medicine

## 2018-07-10 VITALS — BP 118/70 | HR 94 | Temp 98.3°F | Resp 16 | Ht 69.0 in | Wt 134.8 lb

## 2018-07-10 DIAGNOSIS — Z113 Encounter for screening for infections with a predominantly sexual mode of transmission: Secondary | ICD-10-CM

## 2018-07-10 DIAGNOSIS — Z114 Encounter for screening for human immunodeficiency virus [HIV]: Secondary | ICD-10-CM

## 2018-07-10 DIAGNOSIS — R5383 Other fatigue: Secondary | ICD-10-CM

## 2018-07-10 DIAGNOSIS — E559 Vitamin D deficiency, unspecified: Secondary | ICD-10-CM

## 2018-07-10 DIAGNOSIS — Z0000 Encounter for general adult medical examination without abnormal findings: Secondary | ICD-10-CM | POA: Insufficient documentation

## 2018-07-10 DIAGNOSIS — Z23 Encounter for immunization: Secondary | ICD-10-CM | POA: Diagnosis not present

## 2018-07-10 DIAGNOSIS — Z13220 Encounter for screening for lipoid disorders: Secondary | ICD-10-CM

## 2018-07-10 DIAGNOSIS — R634 Abnormal weight loss: Secondary | ICD-10-CM

## 2018-07-10 NOTE — Progress Notes (Signed)
Name: Kurt Simpson   MRN: [id number]    DOB: [birth year]   Date:07/11/2018       Progress Note  Subjective  Chief Complaint  Chief Complaint  Patient presents with  . Annual Exam    HPI  Patient presents for annual CPE.  Diet: Eating out most meals. Mostly fast foods. Exercise: Not exercising - says he is too tired. 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 Would like to start lifting weight.  USPSTF grade A and B recommendations    [facility] Visit from 07/10/2018 in [facility]  AUDIT-C Score  0     Depression:  Depression screen [facility] 2/9 07/10/2018 05/05/2018 07/07/2017 11/17/2015 11/14/2015  Decreased Interest 0 0 0 0 0  Down, Depressed, Hopeless 0 0 1 0 0  PHQ - 2 Score 0 0 1 0 0  Altered sleeping 0 0 0 - -  Tired, decreased energy 0 1 1 - -  Change in appetite 0 0 0 - -  Feeling bad or failure about yourself  0 0 0 - -  Trouble concentrating 0 - 0 - -  Moving slowly or fidgety/restless 0 0 0 - -  Suicidal thoughts 0 0 0 - -  PHQ-9 Score 0 1 2 - -  Difficult doing work/chores Not difficult at all Not difficult at all Not difficult at all - -   Hypertension: BP Readings from Last 3 Encounters:  07/10/18 118/70  05/10/18 119/65  05/05/18 110/68   Weight:  He lost 10lbs when he had enlarged tonsils and wasn't able to eat much; he has been working to gain the weight back, but is actually down 7lbs today from last visit.  Discussed healthy nutrition.  Wt Readings from Last 3 Encounters:  07/10/18 134 lb 12.8 oz (61.1 kg)  05/10/18 141 lb (64 kg)  05/05/18 141 lb 6.4 oz (64.1 kg)   BMI Readings from Last 3 Encounters:  07/10/18 19.91 kg/m  05/10/18 20.82 kg/m  05/05/18 20.88 kg/m   Hep C Screening: Will check today STD testing and prevention (HIV/chl/gon/syphilis): Will check today; states no new partners, but would like testing done today. Male partners only - unsure number of partners in the last  year. Intimate partner violence: No concerns  Prostate Cancer: No family history of prostate cancer; no changes in urination.  Advanced Care Planning: A voluntary discussion about advance care planning including the explanation and discussion of advance directives.  Discussed health care proxy and Living will, and the patient was able to identify a health care proxy as Karene Fry (Mother) Berta Minor (Grandmother).  Patient does not have a living will at present time. If patient does have living will, I have requested they bring this to the clinic to be scanned in to their chart.  Glucose:  Glucose, Bld  Date Value Ref Range Status  07/10/2018 79 65 - 99 mg/dL Final    Comment:    .            Fasting reference interval .   05/10/2018 90 70 - 99 mg/dL Final   Skin cancer: Does not wear sunscreen; does not have any concerning moles or lesions. Colorectal cancer: No family or personal history; no changes in BM's - no blood in stool or dark and tarry stool, no diarrhea or constipation.  Patient Active Problem List   Diagnosis Date Noted  . Enlarged tonsils 04/25/2016  .  Hive 11/14/2015  . Vitamin D deficiency 11/25/2009  . Allergic rhinitis 01/17/2009    Past Surgical History:  Procedure Laterality Date  . NO PAST SURGERIES      Family History  Problem Relation Age of Onset  . Healthy Mother   . Migraines Mother   . Healthy Father     Social History   Socioeconomic History  . Marital status: Single    Spouse name: Not on file  . Number of children: Not on file  . Years of education: Not on file  . Highest education level: Not on file  Occupational History    Comment: call center  Social Needs  . Financial resource strain: Not hard at all  . Food insecurity:    Worry: Never true    Inability: Never true  . Transportation needs:    Medical: No    Non-medical: No  Tobacco Use  . Smoking status: Never Smoker  . Smokeless tobacco: Never Used  Substance and  Sexual Activity  . Alcohol use: No    Alcohol/week: 0.0 standard drinks    Comment: experimented   . Drug use: No  . Sexual activity: Yes    Partners: Female    Birth control/protection: Condom  Lifestyle  . Physical activity:    Days per week: 0 days    Minutes per session: 0 min  . Stress: Not at all  Relationships  . Social connections:    Talks on phone: More than three times a week    Gets together: More than three times a week    Attends religious service: More than 4 times per year    Active member of club or organization: Yes    Attends meetings of clubs or organizations: More than 4 times per year    Relationship status: Never married  . Intimate partner violence:    Fear of current or ex partner: No    Emotionally abused: No    Physically abused: No    Forced sexual activity: No  Other Topics Concern  . Not on file  Social History Narrative   He lives with his mother, step-dad , younger sister.    He was taking classes a [employer], but has taken some time off. He plans on going back.   He is working at [employer] center , Oceanographer    No current outpatient medications on file.  No Known Allergies   ROS  Constitutional: Negative for fever or weight change.  Respiratory: Negative for cough and shortness of breath.   Cardiovascular: Negative for chest pain or palpitations.  Gastrointestinal: Negative for abdominal pain, no bowel changes.  Musculoskeletal: Negative for gait problem or joint swelling.  Skin: Negative for rash.  Neurological: Negative for dizziness or headache.  No other specific complaints in a complete review of systems (except as listed in HPI above).  Objective  Vitals:   07/10/18 1029  BP: 118/70  Pulse: 94  Resp: 16  Temp: 98.3 F (36.8 C)  TempSrc: Oral  SpO2: 99%  Weight: 134 lb 12.8 oz (61.1 kg)  Height: '5\' 9"'  (1.753 m)    Body mass index is 19.91 kg/m.  Physical Exam  Constitutional: Patient  appears well-developed and well-nourished. No distress.  HENT: Head: Normocephalic and atraumatic. Ears: B TMs ok, no erythema or effusion; Nose: Nose normal. Mouth/Throat: Oropharynx is clear and moist. No oropharyngeal exudate.  Eyes: Conjunctivae and EOM are normal. Pupils are equal, round, and reactive to light.  No scleral icterus.  Neck: Normal range of motion. Neck supple. No JVD present. No thyromegaly present.  Cardiovascular: Normal rate, regular rhythm and normal heart sounds.  No murmur heard. No BLE edema. Pulmonary/Chest: Effort normal and breath sounds normal. No respiratory distress. Abdominal: Soft. Bowel sounds are normal, no distension. There is no tenderness. no masses MALE GENITALIA: Deferred RECTAL:  Deferred Musculoskeletal: Normal range of motion, no joint effusions. No gross deformities Neurological: he is alert and oriented to person, place, and time. No cranial nerve deficit. Coordination, balance, strength, speech and gait are normal.  Skin: Skin is warm and dry. No rash noted. No erythema.  Psychiatric: Patient has a normal mood and affect. behavior is normal. Judgment and thought content normal.  Recent Results (from the past 2160 hour(s))  Urine cytology ancillary only     Status: None   Collection Time: 04/13/18 12:00 AM  Result Value Ref Range   Chlamydia Negative     Comment: Normal Reference Range - Negative   Neisseria gonorrhea Negative     Comment: Normal Reference Range - Negative  HIV antibody     Status: None   Collection Time: 04/13/18  3:52 PM  Result Value Ref Range   HIV 1&2 Ab, 4th Generation NON-REACTIVE NON-REACTI    Comment: HIV-1 antigen and HIV-1/HIV-2 antibodies were not detected. There is no laboratory evidence of HIV infection. Marland Kitchen PLEASE NOTE: This information has been disclosed to you from records whose confidentiality may be protected by state law.  If your state requires such protection, then the state law prohibits you  from making any further disclosure of the information without the specific written consent of the person to whom it pertains, or as otherwise permitted by law. A general authorization for the release of medical or other information is NOT sufficient for this purpose. . For additional information please refer to http://education.questdiagnostics.com/faq/FAQ106 (This link is being provided for informational/ educational purposes only.) . Marland Kitchen The performance of this assay has not been clinically validated in patients less than 29 years old. .   RPR     Status: None   Collection Time: 04/13/18  3:52 PM  Result Value Ref Range   RPR Ser Ql NON-REACTIVE NON-REACTI  CBC with Differential/Platelet     Status: Abnormal   Collection Time: 05/10/18  8:00 PM  Result Value Ref Range   WBC 6.5 3.8 - 10.6 K/uL   RBC 5.09 4.40 - 5.90 MIL/uL   Hemoglobin 13.6 13.0 - 18.0 g/dL   HCT 38.4 (L) 40.0 - 52.0 %   MCV 75.5 (L) 80.0 - 100.0 fL   MCH 26.7 26.0 - 34.0 pg   MCHC 35.4 32.0 - 36.0 g/dL   RDW 15.4 (H) 11.5 - 14.5 %   Platelets 249 150 - 440 K/uL   Neutrophils Relative % 76 %   Neutro Abs 4.9 1.4 - 6.5 K/uL   Lymphocytes Relative 10 %   Lymphs Abs 0.6 (L) 1.0 - 3.6 K/uL   Monocytes Relative 14 %   Monocytes Absolute 0.9 0.2 - 1.0 K/uL   Eosinophils Relative 0 %   Eosinophils Absolute 0.0 0 - 0.7 K/uL   Basophils Relative 0 %   Basophils Absolute 0.0 0 - 0.1 K/uL    Comment: Performed at [facility], [address] [place], [place] [address]  Comprehensive metabolic panel     Status: Abnormal   Collection Time: 05/10/18  8:00 PM  Result Value Ref Range   Sodium 135  135 - 145 mmol/L   Potassium 3.4 (L) 3.5 - 5.1 mmol/L   Chloride 99 98 - 111 mmol/L   CO2 25 22 - 32 mmol/L   Glucose, Bld 90 70 - 99 mg/dL   BUN 10 6 - 20 mg/dL   Creatinine, Ser 0.87 0.61 - 1.24 mg/dL   Calcium 9.0 8.9 - 10.3 mg/dL   Total Protein 8.0 6.5 - 8.1 g/dL   Albumin 4.0 3.5 - 5.0 g/dL   AST 62  (H) 15 - 41 U/L   ALT 44 0 - 44 U/L   Alkaline Phosphatase 67 38 - 126 U/L   Total Bilirubin 0.5 0.3 - 1.2 mg/dL   GFR calc non Af Amer >60 >60 mL/min   GFR calc Af Amer >60 >60 mL/min    Comment: (NOTE) The eGFR has been calculated using the CKD EPI equation. This calculation has not been validated in all clinical situations. eGFR's persistently <60 mL/min signify possible Chronic Kidney Disease.    Anion gap 11 5 - 15    Comment: Performed at [facility], [address] [place], [place] [address]  Group A Strep by PCR     Status: Abnormal   Collection Time: 05/10/18  8:00 PM  Result Value Ref Range   Group A Strep by PCR DETECTED (A) NOT DETECTED    Comment: Performed at [facility], [address] [place], [place] [address]  Mononucleosis screen     Status: None   Collection Time: 05/10/18  8:00 PM  Result Value Ref Range   Mono Screen NEGATIVE NEGATIVE    Comment: Performed at [facility], [address] [place],  [address]  Lipid panel     Status: Abnormal   Collection Time: 07/10/18 11:14 AM  Result Value Ref Range   Cholesterol 120 <200 mg/dL   HDL 31 (L) >40 mg/dL   Triglycerides 54 <150 mg/dL   LDL Cholesterol (Calc) 76 mg/dL (calc)    Comment: Reference range: <100 . Desirable range <100 mg/dL for primary prevention;   <70 mg/dL for patients with CHD or diabetic patients  with > or = 2 CHD risk factors. Marland Kitchen LDL-C is now calculated using the Martin-Hopkins  calculation, which is a validated novel method providing  better accuracy than the Friedewald equation in the  estimation of LDL-C.  Cresenciano Genre et al. Annamaria Helling. 4166;063(01): 2061-2068  (http://education.[facility].com/faq/FAQ164)    Total CHOL/HDL Ratio 3.9 <5.0 (calc)   Non-HDL Cholesterol (Calc) 89 <130 mg/dL (calc)    Comment: For patients with diabetes plus 1 major ASCVD risk  factor, treating to a non-HDL-C goal of <100 mg/dL  (LDL-C of <70 mg/dL) is  considered a therapeutic  option.   TSH     Status: None   Collection Time: 07/10/18 11:14 AM  Result Value Ref Range   TSH 1.53 0.40 - 4.50 mIU/L  CBC w/Diff/Platelet     Status: Abnormal   Collection Time: 07/10/18 11:14 AM  Result Value Ref Range   WBC 5.1 3.8 - 10.8 Thousand/uL   RBC 5.54 4.20 - 5.80 Million/uL   Hemoglobin 13.7 13.2 - 17.1 g/dL   HCT 42.1 38.5 - 50.0 %   MCV 76.0 (L) 80.0 - 100.0 fL   MCH 24.7 (L) 27.0 - 33.0 pg   MCHC 32.5 32.0 - 36.0 g/dL   RDW 15.0 11.0 - 15.0 %   Platelets 256 140 - 400 Thousand/uL   MPV 9.8 7.5 - 12.5 fL  Neutro Abs 2,841 1,500 - 7,800 cells/uL   Lymphs Abs 1,642 850 - 3,900 cells/uL   WBC mixed population 515 200 - 950 cells/uL   Eosinophils Absolute 61 15 - 500 cells/uL   Basophils Absolute 41 0 - 200 cells/uL   Neutrophils Relative % 55.7 %   Total Lymphocyte 32.2 %   Monocytes Relative 10.1 %   Eosinophils Relative 1.2 %   Basophils Relative 0.8 %  COMPLETE METABOLIC PANEL WITH GFR     Status: None   Collection Time: 07/10/18 11:14 AM  Result Value Ref Range   Glucose, Bld 79 65 - 99 mg/dL    Comment: .            Fasting reference interval .    BUN 9 7 - 25 mg/dL   Creat 0.82 0.60 - 1.35 mg/dL   GFR, Est Non African American 126 > OR = 60 mL/min/1.60m   GFR, Est African American 147 > OR = 60 mL/min/1.738m  BUN/Creatinine Ratio NOT APPLICABLE 6 - 22 (calc)   Sodium 141 135 - 146 mmol/L   Potassium 4.3 3.5 - 5.3 mmol/L   Chloride 104 98 - 110 mmol/L   CO2 28 20 - 32 mmol/L   Calcium 9.7 8.6 - 10.3 mg/dL   Total Protein 7.0 6.1 - 8.1 g/dL   Albumin 4.5 3.6 - 5.1 g/dL   Globulin 2.5 1.9 - 3.7 g/dL (calc)   AG Ratio 1.8 1.0 - 2.5 (calc)   Total Bilirubin 0.3 0.2 - 1.2 mg/dL   Alkaline phosphatase (APISO) 88 40 - 115 U/L   AST 21 10 - 40 U/L   ALT 37 9 - 46 U/L  Vitamin D (25 hydroxy)     Status: None   Collection Time: 07/10/18 11:14 AM  Result Value Ref Range   Vit D, 25-Hydroxy 32 30 - 100 ng/mL    Comment:  Vitamin D Status         25-OH Vitamin D: . Deficiency:                    <20 ng/mL Insufficiency:             20 - 29 ng/mL Optimal:                 > or = 30 ng/mL . For 25-OH Vitamin D testing on patients on  D2-supplementation and patients for whom quantitation  of D2 and D3 fractions is required, the QuestAssureD(TM) 25-OH VIT D, (D2,D3), LC/MS/MS is recommended: order  code 92623-085-3228patients >2y64yr . For more information on this test, go to: http://education.questdiagnostics.com/faq/FAQ163 (This link is being provided for  informational/educational purposes only.)    PHQ2/9: [facility] 07/10/2018 05/05/2018 07/07/2017 11/17/2015 11/14/2015  Decreased Interest 0 0 0 0 0  Down, Depressed, Hopeless 0 0 1 0 0  PHQ - 2 Score 0 0 1 0 0  Altered sleeping 0 0 0 - -  Tired, decreased energy 0 1 1 - -  Change in appetite 0 0 0 - -  Feeling bad or failure about yourself  0 0 0 - -  Trouble concentrating 0 - 0 - -  Moving slowly or fidgety/restless 0 0 0 - -  Suicidal thoughts 0 0 0 - -  PHQ-9 Score 0 1 2 - -  Difficult doing work/chores Not difficult at all Not difficult at all Not difficult at all - -   Fall Risk: Fall Risk  07/10/2018 05/05/2018 11/17/2015 11/14/2015  Falls in the past year? No No No No   Assessment & Plan  1. Annual physical exam -USPSTF grade A and B recommendations reviewed with patient; age-appropriate recommendations, preventive care, screening tests, etc discussed and encouraged; healthy living encouraged; see AVS for patient education given to patient -Discussed importance of 150 minutes of physical activity weekly, eat two servings of fish weekly, eat one serving of tree nuts ( cashews, pistachios, pecans, almonds.Marland Kitchen) every other day, eat 6 servings of fruit/vegetables daily and drink plenty of water and avoid sweet beverages.  - Lipid panel - TSH - CBC w/Diff/Platelet - COMPLETE METABOLIC PANEL WITH GFR - Vitamin D (25 hydroxy) - HIV Antibody  (routine testing w rflx) - RPR - Cytology (oral, anal, urethral) ancillary only  2. Vitamin D deficiency - Vitamin D (25 hydroxy)  3. Routine screening for STI (sexually transmitted infection) - HIV Antibody (routine testing w rflx) - RPR - Cytology (oral, anal, urethral) ancillary only - Discussed consistent condom use  4. Encounter for screening for HIV - HIV Antibody (routine testing w rflx)  5. Fatigue, unspecified type - TSH - CBC w/Diff/Platelet - COMPLETE METABOLIC PANEL WITH GFR - Vitamin D (25 hydroxy)  6. Unintentional weight loss - TSH - CBC w/Diff/Platelet - COMPLETE METABOLIC PANEL WITH GFR - HIV Antibody (routine testing w rflx)  7. Lipid screening - Lipid panel  8. Needs flu shot - Flu Vaccine QUAD 6+ mos PF IM (Fluarix Quad PF)

## 2018-07-10 NOTE — Patient Instructions (Signed)
Preventive Care 18-39 Years, Male Preventive care refers to lifestyle choices and visits with your health care provider that can promote health and wellness. What does preventive care include?  A yearly physical exam. This is also called an annual well check.  Dental exams once or twice a year.  Routine eye exams. Ask your health care provider how often you should have your eyes checked.  Personal lifestyle choices, including: ? Daily care of your teeth and gums. ? Regular physical activity. ? Eating a healthy diet. ? Avoiding tobacco and drug use. ? Limiting alcohol use. ? Practicing safe sex. What happens during an annual well check? The services and screenings done by your health care provider during your annual well check will depend on your age, overall health, lifestyle risk factors, and family history of disease. Counseling Your health care provider may ask you questions about your:  Alcohol use.  Tobacco use.  Drug use.  Emotional well-being.  Home and relationship well-being.  Sexual activity.  Eating habits.  Work and work Statistician.  Screening You may have the following tests or measurements:  Height, weight, and BMI.  Blood pressure.  Lipid and cholesterol levels. These may be checked every 5 years starting at age 34.  Diabetes screening. This is done by checking your blood sugar (glucose) after you have not eaten for a while (fasting).  Skin check.  Hepatitis C blood test.  Hepatitis B blood test.  Sexually transmitted disease (STD) testing.  Discuss your test results, treatment options, and if necessary, the need for more tests with your health care provider. Vaccines Your health care provider may recommend certain vaccines, such as:  Influenza vaccine. This is recommended every year.  Tetanus, diphtheria, and acellular pertussis (Tdap, Td) vaccine. You may need a Td booster every 10 years.  Varicella vaccine. You may need this if you  have not been vaccinated.  HPV vaccine. If you are 23 or younger, you may need three doses over 6 months.  Measles, mumps, and rubella (MMR) vaccine. You may need at least one dose of MMR.You may also need a second dose.  Pneumococcal 13-valent conjugate (PCV13) vaccine. You may need this if you have certain conditions and have not been vaccinated.  Pneumococcal polysaccharide (PPSV23) vaccine. You may need one or two doses if you smoke cigarettes or if you have certain conditions.  Meningococcal vaccine. One dose is recommended if you are age 65-21 years and a first-year college student living in a residence hall, or if you have one of several medical conditions. You may also need additional booster doses.  Hepatitis A vaccine. You may need this if you have certain conditions or if you travel or work in places where you may be exposed to hepatitis A.  Hepatitis B vaccine. You may need this if you have certain conditions or if you travel or work in places where you may be exposed to hepatitis B.  Haemophilus influenzae type b (Hib) vaccine. You may need this if you have certain risk factors.  Talk to your health care provider about which screenings and vaccines you need and how often you need them. This information is not intended to replace advice given to you by your health care provider. Make sure you discuss any questions you have with your health care provider. Document Released: 11/19/2001 Document Revised: 06/12/2016 Document Reviewed: 07/25/2015 Elsevier Interactive Patient Education  [employer].

## 2018-07-13 ENCOUNTER — Encounter: Payer: Self-pay | Admitting: Family Medicine

## 2018-07-13 ENCOUNTER — Encounter: Payer: BLUE CROSS/BLUE SHIELD | Admitting: Family Medicine

## 2018-07-13 LAB — CBC WITH DIFFERENTIAL/PLATELET
BASOS PCT: 0.8 %
Basophils Absolute: 41 cells/uL (ref 0–200)
EOS ABS: 61 {cells}/uL (ref 15–500)
Eosinophils Relative: 1.2 %
HEMATOCRIT: 42.1 % (ref 38.5–50.0)
HEMOGLOBIN: 13.7 g/dL (ref 13.2–17.1)
LYMPHS ABS: 1642 {cells}/uL (ref 850–3900)
MCH: 24.7 pg — AB (ref 27.0–33.0)
MCHC: 32.5 g/dL (ref 32.0–36.0)
MCV: 76 fL — AB (ref 80.0–100.0)
MPV: 9.8 fL (ref 7.5–12.5)
Monocytes Relative: 10.1 %
Neutro Abs: 2841 cells/uL (ref 1500–7800)
Neutrophils Relative %: 55.7 %
PLATELETS: 256 10*3/uL (ref 140–400)
RBC: 5.54 10*6/uL (ref 4.20–5.80)
RDW: 15 % (ref 11.0–15.0)
TOTAL LYMPHOCYTE: 32.2 %
WBC: 5.1 10*3/uL (ref 3.8–10.8)
WBCMIX: 515 {cells}/uL (ref 200–950)

## 2018-07-13 LAB — TSH: TSH: 1.53 m[IU]/L (ref 0.40–4.50)

## 2018-07-13 LAB — COMPLETE METABOLIC PANEL WITH GFR
AG RATIO: 1.8 (calc) (ref 1.0–2.5)
ALBUMIN MSPROF: 4.5 g/dL (ref 3.6–5.1)
ALT: 37 U/L (ref 9–46)
AST: 21 U/L (ref 10–40)
Alkaline phosphatase (APISO): 88 U/L (ref 40–115)
BUN: 9 mg/dL (ref 7–25)
CHLORIDE: 104 mmol/L (ref 98–110)
CO2: 28 mmol/L (ref 20–32)
Calcium: 9.7 mg/dL (ref 8.6–10.3)
Creat: 0.82 mg/dL (ref 0.60–1.35)
GFR, EST AFRICAN AMERICAN: 147 mL/min/{1.73_m2} (ref >=60)
GFR, EST NON AFRICAN AMERICAN: 126 mL/min/{1.73_m2} (ref >=60)
GLOBULIN: 2.5 g/dL (ref 1.9–3.7)
Glucose, Bld: 79 mg/dL (ref 65–99)
POTASSIUM: 4.3 mmol/L (ref 3.5–5.3)
SODIUM: 141 mmol/L (ref 135–146)
TOTAL PROTEIN: 7 g/dL (ref 6.1–8.1)
Total Bilirubin: 0.3 mg/dL (ref 0.2–1.2)

## 2018-07-13 LAB — LIPID PANEL
Cholesterol: 120 mg/dL (ref <200)
HDL: 31 mg/dL — ABNORMAL LOW (ref >40)
LDL Cholesterol (Calc): 76 mg/dL (calc)
Non-HDL Cholesterol (Calc): 89 mg/dL (calc) (ref <130)
Total CHOL/HDL Ratio: 3.9 (calc) (ref <5.0)
Triglycerides: 54 mg/dL (ref <150)

## 2018-07-13 LAB — HIV ANTIBODY (ROUTINE TESTING W REFLEX): HIV 1&2 Ab, 4th Generation: REACTIVE — AB

## 2018-07-13 LAB — CYTOLOGY, (ORAL, ANAL, URETHRAL) ANCILLARY ONLY
CHLAMYDIA, DNA PROBE: NEGATIVE
Neisseria Gonorrhea: NEGATIVE

## 2018-07-13 LAB — HIV-1/2 AB - DIFFERENTIATION
HIV 1 ANTIBODY: POSITIVE — AB
HIV-2 Ab: NEGATIVE

## 2018-07-13 LAB — VITAMIN D 25 HYDROXY (VIT D DEFICIENCY, FRACTURES): Vit D, 25-Hydroxy: 32 ng/mL (ref 30–100)

## 2018-07-13 LAB — RPR: RPR Ser Ql: NONREACTIVE

## 2018-07-13 NOTE — Progress Notes (Signed)
error 

## 2018-07-14 ENCOUNTER — Encounter: Payer: Self-pay | Admitting: Family Medicine

## 2018-07-14 ENCOUNTER — Ambulatory Visit (INDEPENDENT_AMBULATORY_CARE_PROVIDER_SITE_OTHER): Payer: 59 | Admitting: Family Medicine

## 2018-07-14 VITALS — BP 120/72 | HR 100 | Temp 98.5°F | Resp 16 | Ht 69.0 in | Wt 134.5 lb

## 2018-07-14 DIAGNOSIS — Z21 Asymptomatic human immunodeficiency virus [HIV] infection status: Secondary | ICD-10-CM

## 2018-07-14 NOTE — Telephone Encounter (Signed)
Please see encounter from 07/14/2018

## 2018-07-14 NOTE — Patient Instructions (Signed)
Office Address: [address] [place], [place] [address] Telephone: [phone] - Financial consult and blood work - Tomorrow 07/15/2018 9:00am - Provider and pharmacist - July 30 2018 at 8:45am

## 2018-07-14 NOTE — Progress Notes (Signed)
   Name: Kurt Simpson   MRN: [id number]    DOB: 14-Apr-1996   Date:07/14/2018       Progress Note  Subjective  Chief Complaint  Chief Complaint  Patient presents with  . Follow-up    labs    HPI  Pt presents to discuss test results.  HIV-1 is positive. Has been using condoms; has had one male partner since last check 3 months ago.  Denies illicit drug use. - At previous visit he had noted unintentional weight loss and fatigue. - He was also recently treated for a severe case of strep throat with plans for upcoming tonsillectomy.   - Otherwise no other recent infections or symptoms noted today.  Patient Active Problem List   Diagnosis Date Noted  . Enlarged tonsils 04/25/2016  . Hive 11/14/2015  . Vitamin D deficiency 11/25/2009  . Allergic rhinitis 01/17/2009    Social History   Tobacco Use  . Smoking status: Never Smoker  . Smokeless tobacco: Never Used  Substance Use Topics  . Alcohol use: No    Alcohol/week: 0.0 standard drinks    Comment: experimented     No current outpatient medications on file.  No Known Allergies  I personally reviewed active problem list, medication list, allergies, lab results with the patient/caregiver today.  ROS  Ten systems reviewed and is negative except as mentioned in HPI  Objective  Vitals:   07/14/18 0823  BP: 120/72  Pulse: 100  Resp: 16  Temp: 98.5 F (36.9 C)  TempSrc: Oral  SpO2: 95%  Weight: 134 lb 8 oz (61 kg)  Height: 5\' 9"  (1.753 m)   Body mass index is 19.86 kg/m.  Nursing Note and Vital Signs reviewed.  Physical Exam  No physical examination was performed during consultation visit today.  No results found for this or any previous visit (from the past 72 hour(s)).  Assessment & Plan  1. HIV test positive [facility]) - Ambulatory referral to Infectious Disease - Discussed results in detail with patient. Questions are answered to the best of my ability. - Contacted Infectious Disease, Dr. Zenaida Niece Dam's  Office directly to schedule appts for patient - appointments made per AVS. - Face-to-face time with patient was more than 15 minutes, >50% time spent counseling and coordination of care - Advised no sexual contact of any kind until discussion with ID tomorrow morning.

## 2018-07-15 ENCOUNTER — Other Ambulatory Visit: Payer: 59

## 2018-07-15 ENCOUNTER — Telehealth: Payer: Self-pay | Admitting: Family Medicine

## 2018-07-15 ENCOUNTER — Ambulatory Visit: Payer: 59

## 2018-07-15 ENCOUNTER — Other Ambulatory Visit (HOSPITAL_COMMUNITY)
Admission: RE | Admit: 2018-07-15 | Discharge: 2018-07-15 | Disposition: A | Discharge status: Home / Self Care | Payer: 59 | Source: Ambulatory Visit | Attending: Infectious Diseases | Admitting: Infectious Diseases

## 2018-07-15 DIAGNOSIS — Z113 Encounter for screening for infections with a predominantly sexual mode of transmission: Secondary | ICD-10-CM

## 2018-07-15 DIAGNOSIS — B20 Human immunodeficiency virus [HIV] disease: Secondary | ICD-10-CM

## 2018-07-15 DIAGNOSIS — Z79899 Other long term (current) drug therapy: Secondary | ICD-10-CM

## 2018-07-15 NOTE — Telephone Encounter (Signed)
Spoke with Kurt Simpson with [facility] regarding patient's +HIV testing.  Information is provided directly via telephone, states no additional paperwork is needed at this time.

## 2018-07-16 LAB — URINE CYTOLOGY ANCILLARY ONLY
Chlamydia: NEGATIVE
NEISSERIA GONORRHEA: NEGATIVE

## 2018-07-16 LAB — T-HELPER CELL (CD4) - (RCID CLINIC ONLY)
CD4 % Helper T Cell: 18 % — ABNORMAL LOW (ref 33–55)
CD4 T Cell Abs: 310 /uL — ABNORMAL LOW (ref 400–2700)

## 2018-07-24 ENCOUNTER — Encounter: Payer: No Typology Code available for payment source | Admitting: Family Medicine

## 2018-07-25 LAB — CBC WITH DIFFERENTIAL/PLATELET
BASOS PCT: 0.7 %
Basophils Absolute: 37 cells/uL (ref 0–200)
Eosinophils Absolute: 58 cells/uL (ref 15–500)
Eosinophils Relative: 1.1 %
HCT: 43.3 % (ref 38.5–50.0)
Hemoglobin: 14.2 g/dL (ref 13.2–17.1)
Lymphs Abs: 1595 cells/uL (ref 850–3900)
MCH: 25 pg — ABNORMAL LOW (ref 27.0–33.0)
MCHC: 32.8 g/dL (ref 32.0–36.0)
MCV: 76.1 fL — ABNORMAL LOW (ref 80.0–100.0)
MONOS PCT: 10.9 %
MPV: 9.8 fL (ref 7.5–12.5)
NEUTROS ABS: 3032 {cells}/uL (ref 1500–7800)
Neutrophils Relative %: 57.2 %
PLATELETS: 288 10*3/uL (ref 140–400)
RBC: 5.69 10*6/uL (ref 4.20–5.80)
RDW: 14.9 % (ref 11.0–15.0)
TOTAL LYMPHOCYTE: 30.1 %
WBC mixed population: 578 cells/uL (ref 200–950)
WBC: 5.3 10*3/uL (ref 3.8–10.8)

## 2018-07-25 LAB — COMPLETE METABOLIC PANEL WITH GFR
AG Ratio: 1.6 (calc) (ref 1.0–2.5)
ALT: 21 U/L (ref 9–46)
AST: 14 U/L (ref 10–40)
Albumin: 4.5 g/dL (ref 3.6–5.1)
Alkaline phosphatase (APISO): 86 U/L (ref 40–115)
BILIRUBIN TOTAL: 0.4 mg/dL (ref 0.2–1.2)
BUN: 11 mg/dL (ref 7–25)
CALCIUM: 10 mg/dL (ref 8.6–10.3)
CHLORIDE: 102 mmol/L (ref 98–110)
CO2: 29 mmol/L (ref 20–32)
Creat: 0.91 mg/dL (ref 0.60–1.35)
GFR, EST NON AFRICAN AMERICAN: 120 mL/min/{1.73_m2} (ref >=60)
GFR, Est African American: 139 mL/min/{1.73_m2} (ref >=60)
GLUCOSE: 82 mg/dL (ref 65–99)
Globulin: 2.8 g/dL (calc) (ref 1.9–3.7)
POTASSIUM: 4.3 mmol/L (ref 3.5–5.3)
Sodium: 138 mmol/L (ref 135–146)
TOTAL PROTEIN: 7.3 g/dL (ref 6.1–8.1)

## 2018-07-25 LAB — LIPID PANEL
Cholesterol: 129 mg/dL (ref <200)
HDL: 27 mg/dL — ABNORMAL LOW (ref >40)
LDL CHOLESTEROL (CALC): 87 mg/dL
NON-HDL CHOLESTEROL (CALC): 102 mg/dL (ref <130)
TRIGLYCERIDES: 67 mg/dL (ref <150)
Total CHOL/HDL Ratio: 4.8 (calc) (ref <5.0)

## 2018-07-25 LAB — HIV ANTIBODY (ROUTINE TESTING W REFLEX): HIV 1&2 Ab, 4th Generation: REACTIVE — AB

## 2018-07-25 LAB — HIV-1 RNA ULTRAQUANT REFLEX TO GENTYP+
HIV 1 RNA Quant: 263000 copies/mL — ABNORMAL HIGH
HIV-1 RNA QUANT, LOG: 5.42 {Log_copies}/mL — AB

## 2018-07-25 LAB — HEPATITIS B CORE ANTIBODY, TOTAL: Hep B Core Total Ab: NONREACTIVE

## 2018-07-25 LAB — QUANTIFERON-TB GOLD PLUS
Mitogen-NIL: 8.49 IU/mL
NIL: 0.04 IU/mL
QUANTIFERON-TB GOLD PLUS: NEGATIVE
TB1-NIL: 0.01 [IU]/mL
TB2-NIL: 0.01 [IU]/mL

## 2018-07-25 LAB — HIV-1 GENOTYPE: HIV-1 GENOTYPE: DETECTED — AB

## 2018-07-25 LAB — HEPATITIS B SURFACE ANTIBODY,QUALITATIVE: HEP B S AB: NONREACTIVE

## 2018-07-25 LAB — HLA B*5701: HLA-B*5701 w/rflx HLA-B High: NEGATIVE

## 2018-07-25 LAB — HEPATITIS C ANTIBODY
HEP C AB: NONREACTIVE
SIGNAL TO CUT-OFF: 0.02 (ref <1.00)

## 2018-07-25 LAB — HIV-1/2 AB - DIFFERENTIATION
HIV-1 antibody: POSITIVE — AB
HIV-2 Ab: UNDETERMINED — AB

## 2018-07-25 LAB — RPR: RPR: NONREACTIVE

## 2018-07-25 LAB — HEPATITIS B SURFACE ANTIGEN: Hepatitis B Surface Ag: NONREACTIVE

## 2018-07-25 LAB — HEPATITIS A ANTIBODY, TOTAL: HEPATITIS A AB,TOTAL: REACTIVE — AB

## 2018-07-30 ENCOUNTER — Encounter: Payer: Self-pay | Admitting: Infectious Diseases

## 2018-07-30 ENCOUNTER — Ambulatory Visit (INDEPENDENT_AMBULATORY_CARE_PROVIDER_SITE_OTHER): Payer: 59 | Admitting: Infectious Diseases

## 2018-07-30 ENCOUNTER — Ambulatory Visit (INDEPENDENT_AMBULATORY_CARE_PROVIDER_SITE_OTHER): Payer: 59 | Admitting: Pharmacist

## 2018-07-30 ENCOUNTER — Ambulatory Visit (INDEPENDENT_AMBULATORY_CARE_PROVIDER_SITE_OTHER): Payer: 59 | Admitting: Licensed Clinical Social Worker

## 2018-07-30 VITALS — BP 125/79 | HR 87 | Temp 97.8°F | Wt 134.0 lb

## 2018-07-30 DIAGNOSIS — Z0000 Encounter for general adult medical examination without abnormal findings: Secondary | ICD-10-CM

## 2018-07-30 DIAGNOSIS — J351 Hypertrophy of tonsils: Secondary | ICD-10-CM

## 2018-07-30 DIAGNOSIS — Z23 Encounter for immunization: Secondary | ICD-10-CM | POA: Diagnosis not present

## 2018-07-30 DIAGNOSIS — Z21 Asymptomatic human immunodeficiency virus [HIV] infection status: Secondary | ICD-10-CM

## 2018-07-30 DIAGNOSIS — F4322 Adjustment disorder with anxiety: Secondary | ICD-10-CM

## 2018-07-30 MED ORDER — DOLUTEGRAVIR-LAMIVUDINE 50-300 MG PO TABS: 1.0000 | ORAL_TABLET | Freq: Every day | ORAL | 5 refills | Status: DC

## 2018-07-30 MED FILL — DOVATO 50-300 MG TABS: 50-300 | 30 days supply | Qty: 30 | Fill #0

## 2018-07-30 NOTE — Assessment & Plan Note (Signed)
He has received flu vaccine. Will administer Prevnar today and plan on Pneumovax in 8w. He has Hep A immunity but will need hep b series, HPV series. He is up to date on menveo and tdap. Kurt Simpson

## 2018-07-30 NOTE — Progress Notes (Signed)
Name: Kurt Simpson  DOB: Aug 24, 1996 MRN: [id number] PCP: Steele Sizer, MD   Patient Active Problem List   Diagnosis Date Noted  . HIV (human immunodeficiency virus infection) ([place]) 07/30/2018  . Healthcare maintenance 07/30/2018  . Enlarged tonsils 04/25/2016  . Vitamin D deficiency 11/25/2009  . Allergic rhinitis 01/17/2009     Brief Narrative:  Kurt Simpson is a 22 y.o. male with HIV infection diagnosed in October 2019. History of OIs: none. HIV Risk: MSM/bisexual. CD4 nadir 310/VL 263,000. MAU^Q3335 (-), Quantiferon (-). Hep B sAg (-)  Previous Regimens: . naive  Genotypes: . 07/2018 - no significant mutations   Subjective:  Kurt Simpson is here today with his mother for his first visit for his HIV care.   Kurt Simpson was previously tested in July 2019 and non-reactive at that time. Kurt Simpson tells me that Kurt Simpson had some fairly risky sexual behaviors in the past (insertive/receptive intercourse with male and male partners) with inconsistent condom use and requeted testing at that time. Kurt Simpson has not been diagnosed with any other STIs and denies having any history of syphilis in the past. Kurt Simpson had an illness in August of this year where Kurt Simpson experienced swollen/sore throat, fever, fatigue; tested positive for strep throat at the time, took antibiotics and felt better. Kurt Simpson was surprised that Kurt Simpson had a positive HIV test and started reading about it online. Kurt Simpson has had a little difficult time with the diagnosis but his mother is very supportive of him. Kurt Simpson has had several episodes of strep throat and his mother tells me that Kurt Simpson is having his tonsils out in December of this year due to frequent infections and concerned that this will change the plan for that. Kurt Simpson is otherwise a healthy 22 yo male who drinks alcohol infrequently, does not smoke or use other substances and no other chronic health conditions. Kurt Simpson has never had surgery and no listed allergies and takes no medications outside antihistamines periodically. Kurt Simpson  has read a little into HIV and saw information about Biktarvy. Kurt Simpson has read where some of the medications can interrupt sleep and would like to avoid that side effect if possible. Kurt Simpson has no trouble swallowing larger pills.   Kurt Simpson graduated from [employer], currently working in a Training and development officer. Kurt Simpson lives with his mother. They have several questions regarding long-term outcome/prognosis and what limitations this may cause him. Reports no complaints today suggestive of associated opportunistic infection or advancing HIV disease such as fevers, night sweats, weight loss, anorexia, cough, SOB, nausea, vomiting, diarrhea, headache, sensory changes, lymphadenopathy or oral thrush.    Review of Systems  Constitutional: Negative for chills and fever.  HENT: Negative for tinnitus.   Eyes: Negative for blurred vision and photophobia.  Respiratory: Negative for cough and sputum production.   Cardiovascular: Negative for chest pain.  Gastrointestinal: Negative for diarrhea, nausea and vomiting.  Genitourinary: Negative for dysuria.  Skin: Negative for rash.  Neurological: Negative for headaches.  Psychiatric/Behavioral: Depression: sad mood occasionally. The patient does not have insomnia.     Past Medical History:  Diagnosis Date  . Allergy   . HIV infection ([place])   . Mononucleosis   . Palpitations   . Palpitations   . Vitamin D deficiency     Outpatient Medications Prior to Visit  Medication Sig Dispense Refill  . loratadine (CLARITIN) 10 MG tablet Take 10 mg by mouth daily as needed for allergies.     No facility-administered medications prior to visit.     No Known Allergies  Social History   Tobacco Use  . Smoking status: Never Smoker  . Smokeless tobacco: Never Used  Substance Use Topics  . Alcohol use: No    Alcohol/week: 0.0 standard drinks    Comment: experimented   . Drug use: No    Family History  Problem Relation Age of Onset  . Healthy Mother   . Migraines Mother   .  Healthy Father     Social History   Substance and Sexual Activity  Sexual Activity Not Currently  . Partners: Female  . Birth control/protection: Condom     Objective:   Vitals:   07/30/18 0853  BP: 125/79  Pulse: 87  Temp: 97.8 F (36.6 C)  Weight: 134 lb (60.8 kg)   Body mass index is 19.79 kg/m.  Physical Exam  Constitutional: Kurt Simpson is oriented to person, place, and time. Kurt Simpson appears well-developed and well-nourished.  Seated comfortably in chair during visit. Quiet through most of the encounter. Mom present.   HENT:  Mouth/Throat: Oropharynx is clear and moist and mucous membranes are normal. Normal dentition. No dental abscesses.  Eyes: Pupils are equal, round, and reactive to light. Conjunctivae are normal.  Neck: No thyromegaly present.  Cardiovascular: Normal rate, regular rhythm and normal heart sounds.  No murmur heard. Pulmonary/Chest: Effort normal and breath sounds normal.  Abdominal: Soft. Kurt Simpson exhibits no distension. There is no tenderness.  Musculoskeletal: Normal range of motion.  Lymphadenopathy:    Kurt Simpson has no cervical adenopathy.  Neurological: Kurt Simpson is alert and oriented to person, place, and time.  Skin: Skin is warm and dry. Capillary refill takes less than 2 seconds. No rash noted.  Psychiatric: Kurt Simpson has a normal mood and affect. Judgment normal.  Quiet but engaged in discussion   Vitals reviewed.   Lab Results Lab Results  Component Value Date   WBC 5.3 07/15/2018   HGB 14.2 07/15/2018   HCT 43.3 07/15/2018   MCV 76.1 (L) 07/15/2018   PLT 288 07/15/2018    Lab Results  Component Value Date   CREATININE 0.91 07/15/2018   BUN 11 07/15/2018   NA 138 07/15/2018   K 4.3 07/15/2018   CL 102 07/15/2018   CO2 29 07/15/2018    Lab Results  Component Value Date   ALT 21 07/15/2018   AST 14 07/15/2018   ALKPHOS 67 05/10/2018   BILITOT 0.4 07/15/2018    Lab Results  Component Value Date   CHOL 129 07/15/2018   HDL 27 (L) 07/15/2018   LDLCALC  87 07/15/2018   TRIG 67 07/15/2018   CHOLHDL 4.8 07/15/2018   HIV 1 RNA Quant (copies/mL)  Date Value  07/15/2018 263,000 (H)   CD4 T Cell Abs (/uL)  Date Value  07/15/2018 310 (L)     Assessment & Plan:   Problem List Items Addressed This Visit      Unprioritized   Enlarged tonsils    Has had frequent episodes of pharyngitis and expecting to have tonsils removed in December. I told them that his HIV diagnosis will not hold up this process and I see no reason to postpone or reschedule.       Healthcare maintenance    Kurt Simpson has received flu vaccine. Will administer Prevnar today and plan on Pneumovax in 8w. Kurt Simpson has Hep A immunity but will need hep b series, HPV series. Kurt Simpson is up to date on menveo and tdap. Marland Kitchen       HIV (human immunodeficiency virus infection) ([place]) - Primary  New patient here to establish for HIV care. I discussed with Vevelyn Francois treatment options/side effects, benefits of treatment and long-term outcomes. I discussed how HIV is transmitted and the process of untreated HIV including increased risk for opportunistic infections, cancer, dementia and renal failure. Kurt Simpson was counseled on routine HIV care including medication adherence, blood monitoring, necessary vaccines and follow up visits. Counseled regarding safe sex practices including: condom use, partner disclosure, limiting partners. Kurt Simpson spent time talking with our pharmacist Cassie regarding successful practices of ART and understands to reach out to our clinic in the future with questions.   Will start him on Dovato today as Kurt Simpson is concerned about possible insomnia s/e with Biktarvy. Counseled on proper use, possible side effects of medication and DDI with multivitamins and OTC acid reducing medications (of which Kurt Simpson takes none); advised to separate by 4 hours should Kurt Simpson feel it necessary to incorporate vitamins or supplements to ensure good absorption. Kurt Simpson has insurance and will provide copay assistance through  manufacturer.   I spent greater than 60 minutes with the patient today. Greater than 750% of the time spent face-to-face counseling and coordination of care re: HIV and health maintenance.   Kurt Simpson will return in 4 weeks to repeat blood work and continue with adherence counseling/HIV education. Advised to abstain from sexual contact for now or use condoms as Kurt Simpson is at risk for spreading infection to partner.        Relevant Orders   Pneumococcal conjugate vaccine 13-valent IM (Completed)    Other Visit Diagnoses    Need for pneumococcal vaccine       Relevant Orders   Pneumococcal conjugate vaccine 13-valent IM (Completed)     Meds ordered this encounter  Medications  . DISCONTD: Dolutegravir-lamiVUDine (DOVATO) 50-300 MG TABS    Sig: Take 1 tablet by mouth daily.    Dispense:  30 tablet    Refill:  5    Order Specific Question:   Supervising Provider    Answer:   HATCHER, JEFFREY C [[id number]]    Return in about 4 weeks (around 08/27/2018).   Janene Madeira, MSN, NP-C [facility] [place] Pager: [phone] Office: [phone]  07/30/18  9:28 PM

## 2018-07-30 NOTE — Assessment & Plan Note (Signed)
New patient here to establish for HIV care. I discussed with [name] treatment options/side effects, benefits of treatment and long-term outcomes. I discussed how HIV is transmitted and the process of untreated HIV including increased risk for opportunistic infections, cancer, dementia and renal failure. he was counseled on routine HIV care including medication adherence, blood monitoring, necessary vaccines and follow up visits. Counseled regarding safe sex practices including: condom use, partner disclosure, limiting partners. He spent time talking with our pharmacist Cassie regarding successful practices of ART and understands to reach out to our clinic in the future with questions.   Will start him on Dovato today as he is concerned about possible insomnia s/e with Biktarvy. Counseled on proper use, possible side effects of medication and DDI with multivitamins and OTC acid reducing medications (of which he takes none); advised to separate by 4 hours should he feel it necessary to incorporate vitamins or supplements to ensure good absorption. He has insurance and will provide copay assistance through manufacturer.   I spent greater than 60 minutes with the patient today. Greater than 750% of the time spent face-to-face counseling and coordination of care re: HIV and health maintenance.   He will return in 4 weeks to repeat blood work and continue with adherence counseling/HIV education. Advised to abstain from sexual contact for now or use condoms as he is at risk for spreading infection to partner.

## 2018-07-30 NOTE — Progress Notes (Signed)
HPI: Kurt Simpson is a 22 y.o. male who presents to [facility] for treatment of newly diagnosed HIV.  Patient Active Problem List   Diagnosis Date Noted  . HIV (human immunodeficiency virus infection) ([facility]) 07/30/2018  . Enlarged tonsils 04/25/2016  . Hive 11/14/2015  . Vitamin D deficiency 11/25/2009  . Allergic rhinitis 01/17/2009    Patient's Medications  New Prescriptions   No medications on file  Previous Medications   DOLUTEGRAVIR-LAMIVUDINE (DOVATO) 50-300 MG TABS    Take 1 tablet by mouth daily.  Modified Medications   No medications on file  Discontinued Medications   No medications on file    Allergies: No Known Allergies  Past Medical History: Past Medical History:  Diagnosis Date  . Allergy   . Mononucleosis   . Palpitations   . Palpitations   . Vitamin D deficiency     Social History: Social History   Socioeconomic History  . Marital status: Single    Spouse name: Not on file  . Number of children: Not on file  . Years of education: Not on file  . Highest education level: Not on file  Occupational History    Comment: call center  Social Needs  . Financial resource strain: Not hard at all  . Food insecurity:    Worry: Never true    Inability: Never true  . Transportation needs:    Medical: No    Non-medical: No  Tobacco Use  . Smoking status: Never Smoker  . Smokeless tobacco: Never Used  Substance and Sexual Activity  . Alcohol use: No    Alcohol/week: 0.0 standard drinks    Comment: experimented   . Drug use: No  . Sexual activity: Yes    Partners: Female    Birth control/protection: Condom  Lifestyle  . Physical activity:    Days per week: 0 days    Minutes per session: 0 min  . Stress: Not at all  Relationships  . Social connections:    Talks on phone: More than three times a week    Gets together: More than three times a week    Attends religious service: More than 4 times per year    Active member of club or organization: Yes     Attends meetings of clubs or organizations: More than 4 times per year    Relationship status: Never married  Other Topics Concern  . Not on file  Social History Narrative   He lives with his mother, step-dad , younger sister.    He was taking classes a [employer], but has taken some time off. He plans on going back.   He is working at [employer] center , Museum/gallery curator    Labs: Lab Results  Component Value Date   HIV1RNAQUANT 263,000 (H) 07/15/2018   CD4TABS 310 (L) 07/15/2018    RPR and STI Lab Results  Component Value Date   LABRPR NON-REACTIVE 07/15/2018   LABRPR NON-REACTIVE 07/10/2018   LABRPR NON-REACTIVE 04/13/2018   LABRPR NON-REACTIVE 07/07/2017   LABRPR NON REAC 04/25/2016    STI Results GC CT  07/15/2018 Negative Negative  07/10/2018 Negative Negative  04/13/2018 Negative Negative    Hepatitis B Lab Results  Component Value Date   HEPBSAB NON-REACTIVE 07/15/2018   HEPBSAG NON-REACTIVE 07/15/2018   HEPBCAB NON-REACTIVE 07/15/2018   Hepatitis C Lab Results  Component Value Date   HEPCAB NON-REACTIVE 07/15/2018   Hepatitis A Lab Results  Component Value Date   HAV REACTIVE (A)  07/15/2018   Lipids: Lab Results  Component Value Date   CHOL 129 07/15/2018   TRIG 67 07/15/2018   HDL 27 (L) 07/15/2018   CHOLHDL 4.8 07/15/2018   LDLCALC 87 07/15/2018    Current HIV Regimen: none  Assessment: Jarren is being started on ART today. He was offered to start Bitarvy or Dovato and felt he could swallow Dovato so will go with TAF sparing regemin. Explained the need to take it daily with or without food and not miss any doses.  Explained resistance and how it develops. Counseled patient to take it around the same time each day. Counseled on what to do if dose is missed, if closer to missed dose take immediately, if closer to next dose skip and resume normal schedule. Cautioned on possible side effects the first week or so including nausea,  diarrhea, and headaches. I reviewed patient medications and found no interactions. Counseled patient to separate Dovato from polyvalent cations. Discussed with patient to call clinic if he starts a new medication or herbal supplement. He will use [facility] and will pick up today but have it shipped to his house in the future. He has [employer] so will have a $5 shipping charge from the pharmacy. Follow up with Cassie on 09/17/18@915 .  Plan: - Start Dovato   Amanda Pea, [place] D PGY1 Pharmacy Resident  07/30/2018      11:29 AM

## 2018-07-30 NOTE — Assessment & Plan Note (Signed)
Has had frequent episodes of pharyngitis and expecting to have tonsils removed in December. I told them that his HIV diagnosis will not hold up this process and I see no reason to postpone or reschedule.

## 2018-07-30 NOTE — BH Specialist Note (Signed)
Integrated Behavioral Health Initial Visit  MRN: [id number] Name: Kurt Simpson  Number of Integrated Behavioral Health Clinician visits:: 1/6 Session Start time: 10:05am  Session End time: 10:37am Total time: 30 minutes  Type of Service: Integrated Behavioral Health- Individual/Family Interpretor:No. Interpretor Name and Language: n/a   Warm Hand Off Completed.       SUBJECTIVE: Kurt Simpson is a 22 y.o. male accompanied by Mother Patient was referred by Rexene Alberts for new HIV diagnosis. Patient reports the following symptoms/concerns: newly diagnosed with HIV, unsure about future Duration of problem: 1 week; Severity of problem: mild  OBJECTIVE: Mood: Anxious and Affect: Blunt Risk of harm to self or others: No plan to harm self or others  LIFE CONTEXT: Patient lives with his mother, father, and 2 siblings. 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 Family is his main support system but he reports that he does also have supportive friends. Patient and mom are educated on HIV and the importance of taking medications.   GOALS ADDRESSED: Patient will: 1. Demonstrate ability to: Increase healthy adjustment to current life circumstances  INTERVENTIONS: Interventions utilized: Supportive Counseling and Psychoeducation and/or Health Education   ASSESSMENT: Patient currently experiencing difficulty adjusting to diagnosis as HIV+. The most consistent diagnosis for his symptoms is Adjustment Disorder.   Patient states that he is "dealing okay" with the news, but has had some concerning thoughts and feelings since the surprise diagnosis. He described the process of finding out as a result of a routine physical. Counselor guided patient to explore the thoughts and feelings that are concerning to him. Patient identified being afraid at first, then angry with himself, and anxious/unsure about what happens next.  Counselor guided mom to share her feelings as  well. Mom acknowledges that she was angry at first because she has always taught her children about protected sex and STI's. Knowing that her son had contracted HIV made her angry at him for not doing what they had always talked about. Now, however, she states that she has realized that the important thing is for her son to be healthy and nothing else really overshadows that he is her child. Counselor educated patient and mom on some of the adjustment difficulties that often come down the line, and counseling resources that can address these. Patient indicates that he feels secure in his support system right now, and does not believe he needs professional support from counselor. Counselor encouraged patient and mom to contact her if concerns or needs arise.   Patient may benefit from check-ins as needed and when he sees his doctor.  PLAN: 1. Patient will contact counselor if he decides to pursue counseling.  Angus Palms, LCSW

## 2018-07-30 NOTE — Patient Instructions (Addendum)
Wonderful to meet you today!    Will start you on a new medication called Dovato - this will need to be taken one pill once a day. Can be with or without food.  - Please separate any multivitamins/supplements by at least 4 hours  - Try to take this around the same time every day   Will give you your prevnar vaccine today.   Would like to see you back in 4 weeks to check in.

## 2018-08-10 ENCOUNTER — Ambulatory Visit: Payer: 59

## 2018-08-18 ENCOUNTER — Telehealth: Payer: Self-pay | Admitting: Pharmacist

## 2018-08-18 NOTE — Telephone Encounter (Signed)
Patient called needing a refill of his medication. I helped him with that and told him that [facility] would be reaching out to him soon to confirm.  He is scheduled for a tonsillectomy in a few weeks and will call me if he is started on any new medications.  I will see him in follow up on 12/12. He is doing well on Dovato so far.

## 2018-08-19 NOTE — Telephone Encounter (Signed)
Thank you kindly for helping him navigate.

## 2018-08-21 MED FILL — DOVATO 50-300 MG TABS: 50-300 | 30 days supply | Qty: 30 | Fill #1

## 2018-08-28 DIAGNOSIS — J3501 Chronic tonsillitis: Secondary | ICD-10-CM | POA: Diagnosis not present

## 2018-08-31 ENCOUNTER — Encounter: Payer: Self-pay | Admitting: *Deleted

## 2018-08-31 ENCOUNTER — Other Ambulatory Visit: Payer: Self-pay

## 2018-09-07 NOTE — Discharge Instructions (Signed)
T & A INSTRUCTION SHEET - [facility] °[facility] ° °CREIGHTON VAUGHT, MD °PAUL H. JUENGEL, MD  °P. SCOTT BENNETT °CHAPMAN MCQUEEN, MD ° °[address] [place], [place] [address] TEL. [phone] °[address] [place] [address] [phone] ° °INFORMATION SHEET FOR A TONSILLECTOMY AND ADENDOIDECTOMY ° °About Your Tonsils and Adenoids ° The tonsils and adenoids are normal body tissues that are part of our immune system.  They normally help to protect us against diseases that may enter our mouth and nose.  However, sometimes the tonsils and/or adenoids become too large and obstruct our breathing, especially at night. °  ° If either of these things happen it helps to remove the tonsils and adenoids in order to become healthier. The operation to remove the tonsils and adenoids is called a tonsillectomy and adenoidectomy. ° °The Location of Your Tonsils and Adenoids ° The tonsils are located in the back of the throat on both side and sit in a cradle of muscles. The adenoids are located in the roof of the mouth, behind the nose, and closely associated with the opening of the Eustachian tube to the ear. ° °Surgery on Tonsils and Adenoids ° A tonsillectomy and adenoidectomy is a short operation which takes about thirty minutes.  This includes being put to sleep and being awakened.  Tonsillectomies and adenoidectomies are performed at [facility] and may require observation period in the recovery room prior to going home. ° °Following the Operation for a Tonsillectomy ° A cautery machine is used to control bleeding.  Bleeding from a tonsillectomy and adenoidectomy is minimal and postoperatively the risk of bleeding is approximately four percent, although this rarely life threatening. ° ° ° °After your tonsillectomy and adenoidectomy post-op care at home: ° °1. Our patients are able to go home the same day.  You may be given prescriptions for pain  medications and antibiotics, if indicated. °2. It is extremely important to remember that fluid intake is of utmost importance after a tonsillectomy.  The amount that you drink must be maintained in the postoperative period.  A good indication of whether a child is getting enough fluid is whether his/her urine output is constant.  As long as children are urinating or wetting their diaper every 6 - 8 hours this is usually enough fluid intake.   °3. Although rare, this is a risk of some bleeding in the first ten days after surgery.  This is usually occurs between day five and nine postoperatively.  This risk of bleeding is approximately four percent.  If you or your child should have any bleeding you should remain calm and notify our office or go directly to the Emergency Room at [facility] where they will contact us. Our doctors are available seven days a week for notification.  We recommend sitting up quietly in a chair, place an ice pack on the front of the neck and spitting out the blood gently until we are able to contact you.  Adults should gargle gently with ice water and this may help stop the bleeding.  If the bleeding does not stop after a short time, i.e. 10 to 15 minutes, or seems to be increasing again, please contact us or go to the hospital.   °4. It is common for the pain to be worse at 5 - 7 days postoperatively.  This occurs because the “scab” is peeling off and the mucous membrane (skin of   the throat) is growing back where the tonsils were.   °5. It is common for a low-grade fever, less than 102, during the first week after a tonsillectomy and adenoidectomy.  It is usually due to not drinking enough liquids, and we suggest your use liquid Tylenol or the pain medicine with Tylenol prescribed in order to keep your temperature below 102.  Please follow the directions on the back of the bottle. °6. Do not take aspirin or any products that contain aspirin such as Bufferin, Anacin,  Ecotrin, aspirin gum, Goodies, BC headache powders, etc., after a T&A because it can promote bleeding.  Please check with our office before administering any other medication that may been prescribed by other doctors during the two week post-operative period. °7. If you happen to look in the mirror or into your child’s mouth you will see white/gray patches on the back of the throat.  This is what a scab looks like in the mouth and is normal after having a T&A.  It will disappear once the tonsil area heals completely. However, it may cause a noticeable odor, and this too will disappear with time.     °8. You or your child may experience ear pain after having a T&A.  This is called referred pain and comes from the throat, but it is felt in the ears.  Ear pain is quite common and expected.  It will usually go away after ten days.  There is usually nothing wrong with the ears, and it is primarily due to the healing area stimulating the nerve to the ear that runs along the side of the throat.  Use either the prescribed pain medicine or Tylenol as needed.  °9. The throat tissues after a tonsillectomy are obviously sensitive.  Smoking around children who have had a tonsillectomy significantly increases the risk of bleeding.  DO NOT SMOKE!  ° °General Anesthesia, Adult, Care After °These instructions provide you with information about caring for yourself after your procedure. Your health care provider may also give you more specific instructions. Your treatment has been planned according to current medical practices, but problems sometimes occur. Call your health care provider if you have any problems or questions after your procedure. °What can I expect after the procedure? °After the procedure, it is common to have: °· Vomiting. °· A sore throat. °· Mental slowness. ° °It is common to feel: °· Nauseous. °· Cold or shivery. °· Sleepy. °· Tired. °· Sore or achy, even in parts of your body where you did not have  surgery. ° °Follow these instructions at home: °For at least 24 hours after the procedure: °· Do not: °? Participate in activities where you could fall or become injured. °? Drive. °? Use heavy machinery. °? Drink alcohol. °? Take sleeping pills or medicines that cause drowsiness. °? Make important decisions or sign legal documents. °? Take care of children on your own. °· Rest. °Eating and drinking °· If you vomit, drink water, juice, or soup when you can drink without vomiting. °· Drink enough fluid to keep your urine clear or pale yellow. °· Make sure you have little or no nausea before eating solid foods. °· Follow the diet recommended by your health care provider. °General instructions °· Have a responsible adult stay with you until you are awake and alert. °· Return to your normal activities as told by your health care provider. Ask your health care provider what activities are safe for you. °· Take over-the-counter and   prescription medicines only as told by your health care provider. °· If you smoke, do not smoke without supervision. °· Keep all follow-up visits as told by your health care provider. This is important. °Contact a health care provider if: °· You continue to have nausea or vomiting at home, and medicines are not helpful. °· You cannot drink fluids or start eating again. °· You cannot urinate after 8-12 hours. °· You develop a skin rash. °· You have fever. °· You have increasing redness at the site of your procedure. °Get help right away if: °· You have difficulty breathing. °· You have chest pain. °· You have unexpected bleeding. °· You feel that you are having a life-threatening or urgent problem. °This information is not intended to replace advice given to you by your health care provider. Make sure you discuss any questions you have with your health care provider. °Document Released: 12/30/2000 Document Revised: 02/26/2016 Document Reviewed: 09/07/2015 °Elsevier Interactive Patient Education  © 2018 [employer]vier Inc. ° °

## 2018-09-08 ENCOUNTER — Ambulatory Visit: Payer: 59 | Admitting: Anesthesiology

## 2018-09-08 ENCOUNTER — Encounter
Admission: RE | Disposition: A | Discharge status: Home / Self Care | Payer: Self-pay | Source: Ambulatory Visit | Attending: Otolaryngology

## 2018-09-08 ENCOUNTER — Telehealth: Payer: Self-pay | Admitting: Pharmacist

## 2018-09-08 ENCOUNTER — Ambulatory Visit
Admission: RE | Admit: 2018-09-08 | Discharge: 2018-09-08 | Disposition: A | Discharge status: Home / Self Care | Payer: 59 | Source: Ambulatory Visit | Attending: Otolaryngology | Admitting: Otolaryngology

## 2018-09-08 DIAGNOSIS — J3503 Chronic tonsillitis and adenoiditis: Secondary | ICD-10-CM | POA: Diagnosis present

## 2018-09-08 HISTORY — PX: TONSILLECTOMY AND ADENOIDECTOMY: SHX28

## 2018-09-08 SURGERY — TONSILLECTOMY AND ADENOIDECTOMY
Anesthesia: General | Site: Throat

## 2018-09-08 MED ORDER — PROPOFOL 10 MG/ML IV BOLUS
INTRAVENOUS | Status: DC | PRN
  Administered 2018-09-08: 150 mg via INTRAVENOUS
  Administered 2018-09-08: 50 mg via INTRAVENOUS

## 2018-09-08 MED ORDER — OXYMETAZOLINE HCL 0.05 % NA SOLN
NASAL | Status: DC | PRN
  Administered 2018-09-08: 1 via TOPICAL

## 2018-09-08 MED ORDER — SCOPOLAMINE 1 MG/3DAYS TD PT72
1.0000 | MEDICATED_PATCH | Freq: Once | TRANSDERMAL | Status: DC
  Administered 2018-09-08: 1.5 mg via TRANSDERMAL

## 2018-09-08 MED ORDER — PREDNISOLONE SODIUM PHOSPHATE 15 MG/5ML PO SOLN: ORAL | 0 refills | Status: DC

## 2018-09-08 MED ORDER — FENTANYL CITRATE (PF) 100 MCG/2ML IJ SOLN: 25.0000 ug | INTRAMUSCULAR | Status: DC | PRN

## 2018-09-08 MED ORDER — ACETAMINOPHEN 10 MG/ML IV SOLN
1000.0000 mg | Freq: Once | INTRAVENOUS | Status: AC
  Administered 2018-09-08: 1000 mg via INTRAVENOUS

## 2018-09-08 MED ORDER — ONDANSETRON HCL 4 MG/2ML IJ SOLN: 4.0000 mg | Freq: Once | INTRAMUSCULAR | Status: DC | PRN

## 2018-09-08 MED ORDER — ONDANSETRON HCL 4 MG/2ML IJ SOLN
INTRAMUSCULAR | Status: DC | PRN
  Administered 2018-09-08: 4 mg via INTRAVENOUS

## 2018-09-08 MED ORDER — OXYCODONE HCL 5 MG PO TABS: 5.0000 mg | ORAL_TABLET | Freq: Once | ORAL | Status: AC | PRN

## 2018-09-08 MED ORDER — MIDAZOLAM HCL 5 MG/5ML IJ SOLN: INTRAMUSCULAR | Status: DC | PRN

## 2018-09-08 MED ORDER — FENTANYL CITRATE (PF) 100 MCG/2ML IJ SOLN
INTRAMUSCULAR | Status: DC | PRN
  Administered 2018-09-08 (×3): 50 ug via INTRAVENOUS

## 2018-09-08 MED ORDER — DEXAMETHASONE SODIUM PHOSPHATE 4 MG/ML IJ SOLN
INTRAMUSCULAR | Status: DC | PRN
  Administered 2018-09-08: 8 mg via INTRAVENOUS

## 2018-09-08 MED ORDER — LIDOCAINE HCL (CARDIAC) PF 100 MG/5ML IV SOSY
PREFILLED_SYRINGE | INTRAVENOUS | Status: DC | PRN
  Administered 2018-09-08: 40 mg via INTRAVENOUS

## 2018-09-08 MED ORDER — LACTATED RINGERS IV SOLN
INTRAVENOUS | Status: DC
  Administered 2018-09-08: 08:00:00 via INTRAVENOUS

## 2018-09-08 MED ORDER — LACTATED RINGERS IV SOLN: INTRAVENOUS | Status: DC

## 2018-09-08 MED ORDER — HYDROCODONE-ACETAMINOPHEN 7.5-325 MG/15ML PO SOLN: ORAL | 0 refills | Status: DC

## 2018-09-08 MED ORDER — OXYCODONE HCL 5 MG/5ML PO SOLN
5.0000 mg | Freq: Once | ORAL | Status: AC | PRN
  Administered 2018-09-08: 5 mg via ORAL

## 2018-09-08 MED ORDER — DEXMEDETOMIDINE HCL 200 MCG/2ML IV SOLN
INTRAVENOUS | Status: DC | PRN
  Administered 2018-09-08: 8 ug via INTRAVENOUS

## 2018-09-08 MED ORDER — SUCCINYLCHOLINE CHLORIDE 20 MG/ML IJ SOLN
INTRAMUSCULAR | Status: DC | PRN
  Administered 2018-09-08: 100 mg via INTRAVENOUS

## 2018-09-08 MED ORDER — GLYCOPYRROLATE 0.2 MG/ML IJ SOLN
INTRAMUSCULAR | Status: DC | PRN
  Administered 2018-09-08: 0.1 mg via INTRAVENOUS

## 2018-09-08 MED ORDER — BUPIVACAINE HCL (PF) 0.25 % IJ SOLN
INTRAMUSCULAR | Status: AC | PRN
  Administered 2018-09-08: 2 mL/h

## 2018-09-08 MED ORDER — MIDAZOLAM HCL 5 MG/5ML IJ SOLN
INTRAMUSCULAR | Status: DC | PRN
  Administered 2018-09-08: 2 mg via INTRAVENOUS

## 2018-09-08 SURGICAL SUPPLY — 18 items
BLADE BOVIE TIP EXT 4 (BLADE) ×3 IMPLANT
CANISTER SUCT 1200ML W/VALVE (MISCELLANEOUS) ×3 IMPLANT
CATH ROBINSON RED A/P 10FR (CATHETERS) ×3 IMPLANT
COAG SUCT 10F 3.5MM HAND CTRL (MISCELLANEOUS) ×3 IMPLANT
ELECT REM PT RETURN 9FT ADLT (ELECTROSURGICAL) ×3
ELECTRODE REM PT RTRN 9FT ADLT (ELECTROSURGICAL) ×1 IMPLANT
GLOVE BIO SURGEON STRL SZ7.5 (GLOVE) ×3 IMPLANT
KIT TURNOVER KIT A (KITS) ×3 IMPLANT
NDL HYPO 25GX1X1/2 BEV (NEEDLE) ×1 IMPLANT
NEEDLE HYPO 25GX1X1/2 BEV (NEEDLE) ×3 IMPLANT
NS IRRIG 500ML POUR BTL (IV SOLUTION) ×3 IMPLANT
PACK TONSIL/ADENOIDS (PACKS) ×3 IMPLANT
PENCIL SMOKE EVACUATOR (MISCELLANEOUS) ×3 IMPLANT
SLEEVE SUCTION 125 (MISCELLANEOUS) ×3 IMPLANT
SOL ANTI-FOG 6CC FOG-OUT (MISCELLANEOUS) ×1 IMPLANT
SOL FOG-OUT ANTI-FOG 6CC (MISCELLANEOUS) ×2
SPONGE TONSIL .75 RFD DBL STRL (DISPOSABLE) ×4 IMPLANT
SYR 10ML LL (SYRINGE) ×3 IMPLANT

## 2018-09-08 NOTE — Anesthesia Preprocedure Evaluation (Signed)
Anesthesia Evaluation  Patient identified by MRN, date of birth, ID band Patient awake    Reviewed: Allergy & Precautions, H&P , NPO status , Patient's Chart, lab work & pertinent test results  Airway Mallampati: II  TM Distance: >3 FB Neck ROM: full    Dental no notable dental hx.    Pulmonary    Pulmonary exam normal breath sounds clear to auscultation       Cardiovascular Normal cardiovascular exam Rhythm:regular Rate:Normal     Neuro/Psych    GI/Hepatic   Endo/Other    Renal/GU      Musculoskeletal   Abdominal   Peds  Hematology   Anesthesia Other Findings   Reproductive/Obstetrics                             Anesthesia Physical Anesthesia Plan  ASA: II  Anesthesia Plan: General ETT   Post-op Pain Management:    Induction:   PONV Risk Score and Plan: 2 and Ondansetron, Dexamethasone, Treatment may vary due to age or medical condition and Scopolamine patch - Pre-op  Airway Management Planned:   Additional Equipment:   Intra-op Plan:   Post-operative Plan:   Informed Consent: I have reviewed the patients History and Physical, chart, labs and discussed the procedure including the risks, benefits and alternatives for the proposed anesthesia with the patient or authorized representative who has indicated his/her understanding and acceptance.     Plan Discussed with: CRNA  Anesthesia Plan Comments:         Anesthesia Quick Evaluation

## 2018-09-08 NOTE — Anesthesia Procedure Notes (Signed)
Procedure Name: Intubation Date/Time: 09/08/2018 8:49 AM Performed by: Cameron Ali, CRNA Pre-anesthesia Checklist: Patient identified, Emergency Drugs available, Suction available, Patient being monitored and Timeout performed Patient Re-evaluated:Patient Re-evaluated prior to induction Oxygen Delivery Method: Circle system utilized Preoxygenation: Pre-oxygenation with 100% oxygen Induction Type: IV induction Ventilation: Mask ventilation without difficulty Laryngoscope Size: Mac and 3 Grade View: Grade I Tube type: Oral Rae Tube size: 7.5 mm Number of attempts: 1 Placement Confirmation: ETT inserted through vocal cords under direct vision,  positive ETCO2 and breath sounds checked- equal and bilateral Secured at: 22 cm Tube secured with: Tape Dental Injury: Teeth and Oropharynx as per pre-operative assessment

## 2018-09-08 NOTE — H&P (Signed)
History and physical reviewed and will be scanned in later. No change in medical status reported by the patient or family, appears stable for surgery. All questions regarding the procedure answered, and patient (or family if a child) expressed understanding of the procedure. ? ?Kurt Simpson ?@TODAY@ ?

## 2018-09-08 NOTE — Anesthesia Postprocedure Evaluation (Signed)
Anesthesia Post Note  Patient: Kurt Simpson  Procedure(s) Performed: TONSILLECTOMY AND possible  ADENOIDECTOMY (N/A Throat)  Patient location during evaluation: PACU Anesthesia Type: General Level of consciousness: awake and alert and oriented Pain management: satisfactory to patient Vital Signs Assessment: post-procedure vital signs reviewed and stable Respiratory status: spontaneous breathing, nonlabored ventilation and respiratory function stable Cardiovascular status: blood pressure returned to baseline and stable Postop Assessment: Adequate PO intake and No signs of nausea or vomiting Anesthetic complications: no    Cherly BeachStella, Taleen Prosser J

## 2018-09-08 NOTE — Op Note (Signed)
09/08/2018  9:32 AM    Kurt Simpson  [id number]   Pre-Op Diagnosis:  Chronic adenotonsillitis  Post-op Diagnosis: SAME  Procedure: Adenotonsillectomy  Surgeon: Kurt MealyBennett, Kurt Vancamp S., MD  Anesthesia:  General endotracheal  EBL:  Less than 50 cc  Complications:  None  Findings: 3+ cryptic tonsils, moderately large inflamed adenoids  Procedure: The patient was taken to the Operating Room and placed in the supine position.  After induction of general endotracheal anesthesia, the table was turned 90 degrees and the patient was draped in the usual fashion for adenoidectomy with the eyes protected.  A mouth gag was inserted into the oral cavity to open the mouth, and examination of the oropharynx showed the uvula was non-bifid. The palate was palpated, and there was no evidence of submucous cleft.  A red rubber catheter was placed through the nostril and used to retract the palate.  Examination of the nasopharynx showed obstructing adenoids.  Under indirect vision with the mirror, an adenotome was placed in the nasopharynx.  The adenoids were curetted free.  Reinspection with a mirror showed excellent removal of the adenoids.  Afrin moistened nasopharyngeal packs were then placed to control bleeding.  The nasopharyngeal packs were removed.  Suction cautery was then used to cauterize the nasopharyngeal bed to obtain hemostasis.   The right tonsil was grasped with an Allis clamp and resected from the tonsillar fossa in the usual fashion with the Bovie. The left tonsil was resected in the same fashion. The Bovie was used to obtain hemostasis. Each tonsillar fossa was then carefully injected with 0.25% marcaine, avoiding intravascular injection. The nose and throat were irrigated and suctioned to remove any adenoid debris or blood clot. The red rubber catheter and mouth gag were  removed with no evidence of active bleeding.  The patient was then returned to the anesthesiologist for awakening, and was  taken to the Recovery Room in stable condition.  Cultures:  None.  Specimens:  Adenoids and tonsils.  Disposition:   PACU to home  Plan: Soft, bland diet and push fluids. Take pain medications and antibiotics as prescribed. No strenuous activity for 2 weeks. Follow-up in 3 weeks.  Kurt Simpson 09/08/2018 9:32 AM

## 2018-09-08 NOTE — Telephone Encounter (Signed)
Patient and patient's mother called me this morning to let me know that patient had his tonsils removed today. They were wondering if his prednisone or hydrocodone-acetaminophen interacted with his Dovato, which it does not.  I also told them that it was ok to crush Dovato and put it into some water or orange juice for the next couple of days as it may be hard to swallow the pill. I instructed them to make sure all of the pill was in the liquid and none was left in the glass and none was left on the sides of the glass.  If it did not dissolve properly, he can cut it up into a few pieces and swallow. They agreed with plan.

## 2018-09-08 NOTE — Telephone Encounter (Signed)
Glad to hear his surgery went well. Thank you for the advice on keeping his medications down. You are awesome.

## 2018-09-08 NOTE — Transfer of Care (Signed)
Immediate Anesthesia Transfer of Care Note  Patient: Kurt Simpson  Procedure(s) Performed: TONSILLECTOMY AND possible  ADENOIDECTOMY (N/A Throat)  Patient Location: PACU  Anesthesia Type: General ETT  Level of Consciousness: awake, alert  and patient cooperative  Airway and Oxygen Therapy: Patient Spontanous Breathing and Patient connected to supplemental oxygen  Post-op Assessment: Post-op Vital signs reviewed, Patient's Cardiovascular Status Stable, Respiratory Function Stable, Patent Airway and No signs of Nausea or vomiting  Post-op Vital Signs: Reviewed and stable  Complications: No apparent anesthesia complications

## 2018-09-09 ENCOUNTER — Encounter: Payer: Self-pay | Admitting: Otolaryngology

## 2018-09-10 LAB — SURGICAL PATHOLOGY

## 2018-09-17 ENCOUNTER — Ambulatory Visit (INDEPENDENT_AMBULATORY_CARE_PROVIDER_SITE_OTHER): Payer: 59 | Admitting: Pharmacist

## 2018-09-17 ENCOUNTER — Other Ambulatory Visit: Payer: Self-pay | Admitting: Pharmacist

## 2018-09-17 DIAGNOSIS — Z21 Asymptomatic human immunodeficiency virus [HIV] infection status: Secondary | ICD-10-CM

## 2018-09-17 DIAGNOSIS — Z23 Encounter for immunization: Secondary | ICD-10-CM

## 2018-09-17 NOTE — Progress Notes (Signed)
HPI: Kurt Simpson is a 22 y.o. male who presents to the [facility] pharmacy clinic for HIV follow-up.  Patient Active Problem List   Diagnosis Date Noted  . HIV (human immunodeficiency virus infection) (HCC) 07/30/2018  . Healthcare maintenance 07/30/2018  . Enlarged tonsils 04/25/2016  . Vitamin D deficiency 11/25/2009  . Allergic rhinitis 01/17/2009    Patient's Medications  New Prescriptions   No medications on file  Previous Medications   DOLUTEGRAVIR-LAMIVUDINE (DOVATO) 50-300 MG TABS    Take 1 tablet by mouth daily.   HYDROCODONE-ACETAMINOPHEN (HYCET) 7.5-325 MG/15 ML SOLUTION    10-15cc PO every 4-6 hours as needed for pain   PREDNISOLONE (ORAPRED) 15 MG/5ML SOLUTION    10cc PO BID x 3 days, then 5cc PO BID x 3 days, then 5cc PO QD x 3 days  Modified Medications   No medications on file  Discontinued Medications   No medications on file    Allergies: No Known Allergies  Past Medical History: Past Medical History:  Diagnosis Date  . Allergy   . HIV infection (HCC)   . Mononucleosis   . Palpitations   . Vitamin D deficiency     Social History: Social History   Socioeconomic History  . Marital status: Single    Spouse name: Not on file  . Number of children: Not on file  . Years of education: Not on file  . Highest education level: Not on file  Occupational History    Comment: call center  Social Needs  . Financial resource strain: Not hard at all  . Food insecurity:    Worry: Never true    Inability: Never true  . Transportation needs:    Medical: No    Non-medical: No  Tobacco Use  . Smoking status: Never Smoker  . Smokeless tobacco: Never Used  Substance and Sexual Activity  . Alcohol use: No    Alcohol/week: 0.0 standard drinks    Comment: experimented   . Drug use: No  . Sexual activity: Not Currently    Partners: Female    Birth control/protection: Condom  Lifestyle  . Physical activity:    Days per week: 0 days    Minutes per session: 0  min  . Stress: Not at all  Relationships  . Social connections:    Talks on phone: More than three times a week    Gets together: More than three times a week    Attends religious service: More than 4 times per year    Active member of club or organization: Yes    Attends meetings of clubs or organizations: More than 4 times per year    Relationship status: Never married  Other Topics Concern  . Not on file  Social History Narrative   He lives with his mother, step-dad , younger sister.    He was taking classes a [employer], but has taken some time off. He plans on going back.   He is working at [employer] center , Museum/gallery curator    Labs: Lab Results  Component Value Date   HIV1RNAQUANT 263,000 (H) 07/15/2018   CD4TABS 310 (L) 07/15/2018    RPR and STI Lab Results  Component Value Date   LABRPR NON-REACTIVE 07/15/2018   LABRPR NON-REACTIVE 07/10/2018   LABRPR NON-REACTIVE 04/13/2018   LABRPR NON-REACTIVE 07/07/2017   LABRPR NON REAC 04/25/2016    STI Results GC CT  07/15/2018 Negative Negative  07/10/2018 Negative Negative  04/13/2018 Negative Negative  Hepatitis B Lab Results  Component Value Date   HEPBSAB NON-REACTIVE 07/15/2018   HEPBSAG NON-REACTIVE 07/15/2018   HEPBCAB NON-REACTIVE 07/15/2018   Hepatitis C Lab Results  Component Value Date   HEPCAB NON-REACTIVE 07/15/2018   Hepatitis A Lab Results  Component Value Date   HAV REACTIVE (A) 07/15/2018   Lipids: Lab Results  Component Value Date   CHOL 129 07/15/2018   TRIG 67 07/15/2018   HDL 27 (L) 07/15/2018   CHOLHDL 4.8 07/15/2018   LDLCALC 87 07/15/2018    Current HIV Regimen: Dovato  Assessment: Clifton Custardaron is here today to follow-up for his HIV infection.  He was recently seen as a new HIV diagnosis back in October and started on Dovato.  He tells me today that he has been tolerating it very well with no side effects.  He says that he has not missed any doses.  He did have his  tonsils removed a few weeks ago and has fully recovered. He tells me that he did not have any issues swallowing the medication during that time. [facility]  Will check labs today before he sees HernandezStephanie in February.  He has no immunity to Hepatitis B even though he has been vaccinated before, so I will start that series today.  He requested that he get his 2nd dose at his PCP's office as it is much closer to where he lives. I told him that was fine and that he needed that done in 1 month. He says that he got the HPV vaccine before, so will defer that until he sees Judeth CornfieldStephanie again to reassess. He has already had his flu shot.  Plan: - Continue Biktarvy PO once daily - HIV VL, CD4 today - Hep B vaccine #1/3 today - F/u with Stephanie 2/10 at 945am   L. , PharmD, BCIDP, AAHIVP, CPP Infectious Diseases Clinical Pharmacist [facility] 09/18/2018, 11:51 AM

## 2018-09-18 LAB — T-HELPER CELL (CD4) - (RCID CLINIC ONLY)
CD4 % Helper T Cell: 28 % — ABNORMAL LOW (ref 33–55)
CD4 T Cell Abs: 680 /uL (ref 400–2700)

## 2018-09-22 LAB — HIV-1 RNA QUANT-NO REFLEX-BLD
HIV 1 RNA Quant: <20 copies/mL — AB
HIV-1 RNA Quant, Log: <1.3 Log copies/mL — AB

## 2018-09-24 MED FILL — DOVATO 50-300 MG TABS: 50-300 | 30 days supply | Qty: 30 | Fill #2

## 2018-10-12 ENCOUNTER — Encounter: Payer: Self-pay | Admitting: Infectious Diseases

## 2018-10-16 ENCOUNTER — Ambulatory Visit
Admission: EM | Admit: 2018-10-16 | Discharge: 2018-10-16 | Disposition: A | Discharge status: Home / Self Care | Payer: 59 | Attending: Family Medicine | Admitting: Family Medicine

## 2018-10-16 ENCOUNTER — Other Ambulatory Visit: Payer: Self-pay

## 2018-10-16 ENCOUNTER — Encounter: Payer: Self-pay | Admitting: Emergency Medicine

## 2018-10-16 DIAGNOSIS — L0291 Cutaneous abscess, unspecified: Secondary | ICD-10-CM | POA: Diagnosis not present

## 2018-10-16 MED ORDER — DOXYCYCLINE HYCLATE 100 MG PO CAPS: 100.0000 mg | ORAL_CAPSULE | Freq: Two times a day (BID) | ORAL | 0 refills | Status: DC

## 2018-10-16 NOTE — ED Triage Notes (Signed)
Patient c/o tender abscess in his buttock for over a week.  Patient reports some drainage from the abscess.

## 2018-10-16 NOTE — ED Provider Notes (Signed)
[facility]    CSN: [id number] Arrival date & time: 10/16/18  1255  History   Chief Complaint Chief Complaint  Patient presents with  . Abscess   HPI  23 year old male presents with a buttock abscess.  Patient reports that he had a bump on his buttock after Christmas.  He states that earlier this week the area became larger, painful, and is now draining.  Abscess is located on the medial portion of the left buttock.  He states that the pain is quite severe.  Was worse today when he was sitting down at work.  It caused him to have to leave work.  No relieving factors.  No medications or interventions have been tried.  No relieving factors.  No other associated symptoms.  No other complaints.  PMH, Surgical Hx, Family Hx, Social History reviewed and updated as below.  Past Medical History:  Diagnosis Date  . Allergy   . HIV infection (HCC)   . Mononucleosis   . Palpitations   . Vitamin D deficiency     Patient Active Problem List   Diagnosis Date Noted  . HIV (human immunodeficiency virus infection) (HCC) 07/30/2018  . Healthcare maintenance 07/30/2018  . Enlarged tonsils 04/25/2016  . Vitamin D deficiency 11/25/2009  . Allergic rhinitis 01/17/2009    Past Surgical History:  Procedure Laterality Date  . NO PAST SURGERIES    . TONSILLECTOMY AND ADENOIDECTOMY N/A 09/08/2018   Procedure: TONSILLECTOMY AND possible  ADENOIDECTOMY;  Surgeon: Geanie Logan, MD;  Location: [facility];  Service: ENT;  Laterality: N/A;    Home Medications    Prior to Admission medications   Medication Sig Start Date End Date Taking? Authorizing Provider  Dolutegravir-lamiVUDine (DOVATO) 50-300 MG TABS Take 1 tablet by mouth daily. 07/30/18  Yes Blanchard Kelch, NP  doxycycline (VIBRAMYCIN) 100 MG capsule Take 1 capsule (100 mg total) by mouth 2 (two) times daily. 10/16/18   Tommie Sams, DO    Family History Family History  Problem Relation Age of Onset  . Healthy  Mother   . Migraines Mother   . Healthy Father     Social History Social History   Tobacco Use  . Smoking status: Never Smoker  . Smokeless tobacco: Never Used  Substance Use Topics  . Alcohol use: No    Alcohol/week: 0.0 standard drinks    Comment: experimented   . Drug use: No     Allergies   Patient has no known allergies.   Review of Systems Review of Systems  Constitutional: Negative for fever.  Skin:       Abscess.   Physical Exam Triage Vital Signs ED Triage Vitals  Enc Vitals Group     BP 10/16/18 1307 112/70     Pulse Rate 10/16/18 1307 83     Resp 10/16/18 1307 16     Temp 10/16/18 1307 98.2 F (36.8 C)     Temp Source 10/16/18 1307 Oral     SpO2 10/16/18 1307 99 %     Weight 10/16/18 1304 143 lb (64.9 kg)     Height 10/16/18 1304 5' 9.5" (1.765 m)     Head Circumference --      Peak Flow --      Pain Score 10/16/18 1304 9     Pain Loc --      Pain Edu? --      Excl. in GC? --    Updated Vital Signs BP 112/70 (BP Location: Left  Arm)   Pulse 83   Temp 98.2 F (36.8 C) (Oral)   Resp 16   Ht 5' 9.5" (1.765 m)   Wt 64.9 kg   SpO2 99%   BMI 20.81 kg/m   Visual Acuity Right Eye Distance:   Left Eye Distance:   Bilateral Distance:    Right Eye Near:   Left Eye Near:    Bilateral Near:     Physical Exam Vitals signs and nursing note reviewed.  Constitutional:      General: He is not in acute distress. HENT:     Head: Normocephalic and atraumatic.  Eyes:     General:        Right eye: No discharge.        Left eye: No discharge.     Conjunctiva/sclera: Conjunctivae normal.  Pulmonary:     Effort: Pulmonary effort is normal. No respiratory distress.  Skin:    Comments: Left buttock -patient has an abscess medial aspect of the left buttock into the gluteal cleft.  Actively draining.  Purulence exuded with pressure.  Neurological:     Mental Status: He is alert.  Psychiatric:        Mood and Affect: Mood normal.        Behavior:  Behavior normal.    UC Treatments / Results  Labs (all labs ordered are listed, but only abnormal results are displayed) Labs Reviewed - No data to display  EKG None  Radiology No results found.  Procedures Procedures (including critical care time)  Medications Ordered in UC Medications - No data to display  Initial Impression / Assessment and Plan / UC Course  I have reviewed the triage vital signs and the nursing notes.  Pertinent labs & imaging results that were available during my care of the patient were reviewed by me and considered in my medical decision making (see chart for details).    23 year old male presents with abscess.  Actively draining so will not pursue incision and drainage at this time.  Advised warm soaks.  Doxycycline as prescribed.  Supportive care.  Final Clinical Impressions(s) / UC Diagnoses   Final diagnoses:  Abscess     Discharge Instructions     Warm frequent soaks.   Antibiotic as prescribed.  Take care  Dr. Adriana Simasook    ED Prescriptions    Medication Sig Dispense Auth. Provider   doxycycline (VIBRAMYCIN) 100 MG capsule Take 1 capsule (100 mg total) by mouth 2 (two) times daily. 14 capsule Tommie Samsook, Annabel Gibeau G, DO     Controlled Substance Prescriptions [place] Controlled Substance Registry consulted? Not Applicable   Tommie SamsCook, Tesslyn Baumert G, DO 10/16/18 1453

## 2018-10-16 NOTE — Discharge Instructions (Signed)
Warm frequent soaks.   Antibiotic as prescribed.  Take care  Dr. Adriana Simasook

## 2018-10-19 ENCOUNTER — Ambulatory Visit (INDEPENDENT_AMBULATORY_CARE_PROVIDER_SITE_OTHER): Payer: 59 | Admitting: Emergency Medicine

## 2018-10-19 DIAGNOSIS — Z23 Encounter for immunization: Secondary | ICD-10-CM

## 2018-10-22 ENCOUNTER — Encounter: Payer: Self-pay | Admitting: Family Medicine

## 2018-10-22 ENCOUNTER — Encounter: Payer: Self-pay | Admitting: Family

## 2018-10-22 ENCOUNTER — Telehealth: Payer: Self-pay | Admitting: *Deleted

## 2018-10-22 ENCOUNTER — Ambulatory Visit (INDEPENDENT_AMBULATORY_CARE_PROVIDER_SITE_OTHER): Payer: Self-pay | Admitting: Physician Assistant

## 2018-10-22 ENCOUNTER — Ambulatory Visit (INDEPENDENT_AMBULATORY_CARE_PROVIDER_SITE_OTHER): Payer: 59 | Admitting: Family

## 2018-10-22 VITALS — BP 116/70 | HR 90 | Temp 98.4°F | Resp 20 | Wt 143.0 lb

## 2018-10-22 VITALS — BP 111/71 | HR 83 | Temp 98.4°F | Wt 145.0 lb

## 2018-10-22 DIAGNOSIS — L0231 Cutaneous abscess of buttock: Secondary | ICD-10-CM | POA: Diagnosis not present

## 2018-10-22 MED ORDER — ACETAMINOPHEN-CODEINE #2 300-15 MG PO TABS: 1.0000 | ORAL_TABLET | ORAL | 0 refills | Status: AC | PRN

## 2018-10-22 MED FILL — DOVATO 50-300 MG TABS: 50-300 | 30 days supply | Qty: 30 | Fill #3

## 2018-10-22 NOTE — Progress Notes (Addendum)
Patient ID: Kurt Simpson DOB: May 12, 1996 AGE: [age] y.o. MRN: [id number]   PCP: Alba CorySowles, Krichna, MD   Chief Complaint:  Chief Complaint  Patient presents with  . Abscess    2 weeks     Subjective:    HPI:  Kurt Simpson is a 23 y.o. male presents for evaluation  Chief Complaint  Patient presents with  . Abscess    2 weeks    23 year old male [facility] with 3 week history of left buttock's abscess. Presented around Christmas time. Patient did shave his buttocks area. Began as small pimple. Has since enlarged in size and developed worsening associated pain.   [facility] for left buttock's abscess previously. [facility]. Abscess was actively draining. No I&D performed. Patient prescribed 1-week course of Doxycycline 100mg  bid.  Patient reports no improvement. Has continued to drain scant amount intermittently. Worsening pain. Feels underlying abscess/mass has enlarged in size. Taking OTC Tylenol and ibuprofen for pain/discomfort. Applying warm compress. Denies fever, chills, headache, nausea/vomiting, abdominal pain, diarrhea. Patient denies previous history of abscess or folliculitis. Patient underwent tonsillectomy 09/08/2018; no complications/issues. No IV drug use.  Patient with asymptomatic HIV. Diagnosed October 2019. Managed by Rexene AlbertsStephanie Dixon NP with [facility] and [facility] pharmacy clinic. Last seen on 09/17/2018. On Dovato (Dolutegravir-Lamivudine) daily. Patient's last blood work performed on 09/17/2018; HIV 1 RNA Quant was <20 detected (3 months ago was 263k) and HIV 1 RNA Quant, Log <1.30 detected (3 months ago was 5.42). CD4 T Cell Abs 680 (WNL) and CD4 % Helper T Cell 28% (less than normal).  A limited review of symptoms was performed, pertinent positives and negatives as mentioned in HPI.  The following portions of the patient's history were  reviewed and updated as appropriate: allergies, current medications and past medical history.  Patient Active Problem List   Diagnosis Date Noted  . HIV (human immunodeficiency virus infection) (HCC) 07/30/2018  . Healthcare maintenance 07/30/2018  . Enlarged tonsils 04/25/2016  . Vitamin D deficiency 11/25/2009  . Allergic rhinitis 01/17/2009    No Known Allergies  Current Outpatient Medications on File Prior to Visit  Medication Sig Dispense Refill  . Dolutegravir-lamiVUDine (DOVATO) 50-300 MG TABS Take 1 tablet by mouth daily. 30 tablet 5  . doxycycline (VIBRAMYCIN) 100 MG capsule Take 1 capsule (100 mg total) by mouth 2 (two) times daily. 14 capsule 0   No current facility-administered medications on file prior to visit.        Objective:   Vitals:   10/22/18 0945  BP: 116/70  Pulse: 90  Resp: 20  Temp: 98.4 F (36.9 C)  SpO2: 98%     Wt Readings from Last 3 Encounters:  10/22/18 143 lb (64.9 kg)  10/16/18 143 lb (64.9 kg)  09/08/18 141 lb (64 kg)    Physical Exam:   General Appearance:  Patient sitting comfortably on examination table. Conversational. Peri JeffersonGood self-historian. In no acute distress. Afebrile.   Head:  Normocephalic, without obvious abnormality, atraumatic  Lungs:   Clear to auscultation bilaterally, respirations unlabored  Heart:  Regular rate and rhythm, S1 and S2 normal, no murmur, rub, or gallop  Abdomen:   Soft, non-tender.  Extremities: Extremities normal, atraumatic, no cyanosis or edema  Pulses: 2+ and symmetric  Skin: Left buttock inspection reveals pinpoint aperture, close to crease. Not at pilonidal cyst location. Scant purulent drainage excreted with manual  pressure. Palpable underlying indurated ovular mass, 2cm x 4cm, center portion with 1cm x 1cm palpable fluctuance. No palpable warmth. No surrounding cellulitis. No streaking redness. Significant tenderness with palpation.  Lymph nodes: Cervical, supraclavicular, and axillary nodes  normal  Neurologic: Normal    Assessment & Plan:    Exam findings, diagnosis etiology and medication use and indications reviewed with patient. Follow-Up and discharge instructions provided. No emergent/urgent issues found on exam.  Patient education was provided.   Patient verbalized understanding of information provided and agrees with plan of care (POC), all questions answered. The patient is advised to call or return to clinic if condition does not see an improvement in symptoms, or to seek the care of the closest emergency department if condition worsens with the below plan.    1. Abscess of buttock, left  Patient with 3 week history of worsening left buttock's abscess. Actively draining. On 6th day of 7-day course of Doxycycline. Afebrile. No tachycardia. No associated cellulitis. Patient is HIV positive (asymptomatic and well-controlled, managed on Dovato). Called [facility] in [place], scheduled appointment for patient today at 3:15pm. Suspect patient may need I&D (may need packing placed) and may need wound culture. Provided patient work excuse note for today.   Janalyn Harder, MHS, PA-C Rulon Sera, MHS, PA-C Advanced Practice Provider [facility]  [address] [facility], 1st Floor [place], [place] [address] (p):  [phone] [email or web address]

## 2018-10-22 NOTE — Patient Instructions (Signed)
Thank you for choosing [facility] for your health care needs.  You have been diagnosed with a cutaneous (skin) abscess of the left buttocks.  Continue to apply warm compresses. Continue to take OTC Tylenol or ibuprofen. Continue antibiotic, Doxycycline, as prescribed.  You have an appointment with Infectious Disease at the [facility] ([address]) in [place] today at 3:15pm.  They will evaluate your abscess. May perform and I&D (incision and drainage). And will most likely culture wound.  Call [facility] if there is any way we could help. Hope you feel better soon!  Skin Abscess  A skin abscess is an infected area of your skin that contains pus and other material. An abscess can happen in any part of your body. Some abscesses break open (rupture) on their own. Most continue to get worse unless they are treated. The infection can spread deeper into the body and into your blood, which can make you feel sick. A skin abscess is caused by germs that enter the skin through a cut or scrape. It can also be caused by blocked oil and sweat glands or infected hair follicles. This condition is usually treated by:  Draining the pus.  Taking antibiotic medicines.  Placing a warm, wet washcloth over the abscess. Follow these instructions at home: Medicines   Take over-the-counter and prescription medicines only as told by your doctor.  If you were prescribed an antibiotic medicine, take it as told by your doctor. Do not stop taking the antibiotic even if you start to feel better. Abscess care   If you have an abscess that has not drained, place a warm, clean, wet washcloth over the abscess several times a day. Do this as told by your doctor.  Follow instructions from your doctor about how to take care of your abscess. Make sure you: ? Cover the abscess with a bandage (dressing). ? Change your bandage or gauze as told by your doctor. ? Wash your hands with soap and water before  you change the bandage or gauze. If you cannot use soap and water, use hand sanitizer.  Check your abscess every day for signs that the infection is getting worse. Check for: ? More redness, swelling, or pain. ? More fluid or blood. ? Warmth. ? More pus or a bad smell. General instructions  To avoid spreading the infection: ? Do not share personal care items, towels, or hot tubs with others. ? Avoid making skin-to-skin contact with other people.  Keep all follow-up visits as told by your doctor. This is important. Contact a doctor if:  You have more redness, swelling, or pain around your abscess.  You have more fluid or blood coming from your abscess.  Your abscess feels warm when you touch it.  You have more pus or a bad smell coming from your abscess.  You have a fever.  Your muscles ache.  You have chills.  You feel sick. Get help right away if:  You have very bad (severe) pain.  You see red streaks on your skin spreading away from the abscess. Summary  A skin abscess is an infected area of your skin that contains pus and other material.  The abscess is caused by germs that enter the skin through a cut or scrape. It can also be caused by blocked oil and sweat glands or infected hair follicles.  Follow your doctor's instructions on caring for your abscess, taking medicines, preventing infections, and keeping follow-up visits. This information is not intended to replace  advice given to you by your health care provider. Make sure you discuss any questions you have with your health care provider. Document Released: 03/11/2008 Document Revised: 11/06/2017 Document Reviewed: 11/06/2017 Elsevier Interactive Patient Education  2019 [employer].

## 2018-10-22 NOTE — Patient Instructions (Signed)
Nice to see you.  Your wound will drain for the next day or so.  Tylenol or ibuprofen as needed for discomfort. Tylenol 3 as needed for uncontrolled pain. Limit tylenol dose to 3,000 mg per day.  Cleanse with soap and water.  Watch for worsening of symptoms including redness, swelling, fevers or additional drainage.   Follow up in 1 week for wound check.

## 2018-10-22 NOTE — Telephone Encounter (Signed)
Received call from Janalyn Harder, PA at [facility]. Patient is there for unresolved buttock abscess present for 3 weeks. He is on day 6 of 7 day course of doxycycline.  She is unable to culture the abscess, asked if he could be seen here today.  RN scheduled with Judeth Cornfield in her same day slot 10/22/18 at 3:15. Patient accepted. Andree Coss, RN

## 2018-10-22 NOTE — Assessment & Plan Note (Signed)
Successful incision and drainage of gluteal abscess. Will continue with doxycycline until completed. Post care instructions provided including to monitor for fever, increasing pain/drainage, or worsening of symptoms. Prescription for Tylenol 3 provided for pain as needed. Follow up in 1 week or sooner if needed for wound check.

## 2018-10-22 NOTE — Progress Notes (Signed)
Subjective:    Patient ID: Kurt Simpson, male    DOB: 29-Dec-1995, 23 y.o.   MRN: [id number]  Chief Complaint  Patient presents with  . Gluteal abscess     HPI:  Kurt Simpson is a 23 y.o. male who presents today for an acute office visit.   This is a new problem. Kurt Simpson was [facility] in [place] for an unresolved abscess located on his buttock that has been going on for about 3 weeks and has not resolved with his prescribed doxycycline from an Urgent Care where it was drained. Feels like the abscess has worsened since initial onset and continues to have pain with a severity of an 8 that is painful with walking, sitting or movement. No systemic symptoms including fevers, chills or sweats. There was initial drainage which has slowed to periodic. No trauma or injury to the area.    No Known Allergies    Outpatient Medications Prior to Visit  Medication Sig Dispense Refill  . Dolutegravir-lamiVUDine (DOVATO) 50-300 MG TABS Take 1 tablet by mouth daily. 30 tablet 5  . doxycycline (VIBRAMYCIN) 100 MG capsule Take 1 capsule (100 mg total) by mouth 2 (two) times daily. 14 capsule 0   No facility-administered medications prior to visit.      Past Medical History:  Diagnosis Date  . Allergy   . HIV infection (HCC)   . Mononucleosis   . Palpitations   . Vitamin D deficiency      Past Surgical History:  Procedure Laterality Date  . NO PAST SURGERIES    . TONSILLECTOMY AND ADENOIDECTOMY N/A 09/08/2018   Procedure: TONSILLECTOMY AND possible  ADENOIDECTOMY;  Surgeon: Geanie LoganBennett, Paul, MD;  [facility];  Service: ENT;  Laterality: N/A;      Review of Systems  Constitutional: Negative for appetite change, chills, fatigue, fever and unexpected weight change.  Eyes: Negative for visual disturbance.  Respiratory: Negative for cough, chest tightness, shortness of breath and wheezing.   Cardiovascular: Negative for chest pain and leg swelling.    Gastrointestinal: Negative for abdominal pain, constipation, diarrhea, nausea and vomiting.  Genitourinary: Negative for dysuria, flank pain, frequency, genital sores, hematuria and urgency.       Positive for glueteal abscess   Skin: Negative for rash.  Allergic/Immunologic: Negative for immunocompromised state.  Neurological: Negative for dizziness and headaches.      Objective:    BP 111/71   Pulse 83   Temp 98.4 F (36.9 C) (Oral)   Wt 145 lb (65.8 kg)   BMI 21.11 kg/m  Nursing note and vital signs reviewed.  Physical Exam Constitutional:      General: He is not in acute distress.    Appearance: He is well-developed.  Cardiovascular:     Rate and Rhythm: Normal rate and regular rhythm.     Heart sounds: Normal heart sounds.  Pulmonary:     Effort: Pulmonary effort is normal.     Breath sounds: Normal breath sounds.  Genitourinary:    Comments: Large gluteal abscess located around the 8 o clock position of the right gluteal. Significant tenderness and induration present. There is a small amount of purulent drainage.  Skin:    General: Skin is warm and dry.  Neurological:     Mental Status: He is alert and oriented to person, place, and time.  Psychiatric:        Behavior: Behavior normal.        Thought Content: Thought content normal.  Judgment: Judgment normal.      Procedure note:  Informed consent was obtained with discussion including the risks of the procedure, the benefits, and the alternative treatments. The patient verbally wished to continue. Timeout was performed. The right gluteal site was properly identified and marked. It was cleansed with betadine using concentric circle. Skin anesthesia was applied with lidocaine 2% jelly and then local anesthesia was injected with 1% lidocaine until adequate anesthesia was achieved. A #11 blade was used to make a small puncture with immediate return of pink, thick drainage. Continued to apply pressure with  decreased size of abscess. Gauze was applied to the top to all continued drainage. Post-care was provided and patient tolerated the procedure without complication. Will follow up for a wound check in 1 week.       Assessment & Plan:   Problem List Items Addressed This Visit      Other   Gluteal abscess - Primary    Successful incision and drainage of gluteal abscess. Will continue with doxycycline until completed. Post care instructions provided including to monitor for fever, increasing pain/drainage, or worsening of symptoms. Prescription for Tylenol 3 provided for pain as needed. Follow up in 1 week or sooner if needed for wound check.           I am having Kurt Simpson start on acetaminophen-codeine. I am also having him maintain his Dolutegravir-lamiVUDine and doxycycline.   Meds ordered this encounter  Medications  . acetaminophen-codeine (TYLENOL #2) 300-15 MG tablet    Sig: Take 1 tablet by mouth every 4 (four) hours as needed for up to 3 days for moderate pain.    Dispense:  18 tablet    Refill:  0    Order Specific Question:   Supervising Provider    Answer:   Judyann Munson [[id number]]     Follow-up: Return in about 1 week (around 10/29/2018), or if symptoms worsen or fail to improve.   Marcos Eke, MSN, FNP-C Nurse Practitioner [facility] Office phone: [phone] Pager: [phone] [facility] Main number: [phone]

## 2018-10-29 ENCOUNTER — Encounter: Payer: Self-pay | Admitting: Family

## 2018-10-29 ENCOUNTER — Ambulatory Visit (INDEPENDENT_AMBULATORY_CARE_PROVIDER_SITE_OTHER): Payer: 59 | Admitting: Family

## 2018-10-29 VITALS — BP 113/68 | HR 88 | Temp 98.6°F | Wt 144.0 lb

## 2018-10-29 DIAGNOSIS — L0231 Cutaneous abscess of buttock: Secondary | ICD-10-CM | POA: Diagnosis not present

## 2018-10-29 DIAGNOSIS — Z79899 Other long term (current) drug therapy: Secondary | ICD-10-CM

## 2018-10-29 DIAGNOSIS — Z792 Long term (current) use of antibiotics: Secondary | ICD-10-CM

## 2018-10-29 NOTE — Assessment & Plan Note (Signed)
Mr. Kurt Simpson appears to be healing well from incision and drainage of his abscess although I have concern the abscess may still be present. Will continue to allow to drain and if not improved in the next several days will refer to general surgery for further intervention. Advised to monitor for signs of infection and keep the site clean with soap and water.

## 2018-10-29 NOTE — Patient Instructions (Signed)
Nice to see you.  Continue to cleanse with soap and water.   May continue to drain. If no improvement in the next several days let me know and we will get you referred to general surgery.  Have a great day!

## 2018-10-29 NOTE — Progress Notes (Signed)
   Subjective:    Patient ID: Kurt Simpson, male    DOB: Jan 05, 1996, 23 y.o.   MRN: [id number]  Chief Complaint  Patient presents with  . Gluteal abscess     HPI:  Kurt Simpson is a 23 y.o. male who presents today for wound check following I&D of gluteal abscess.  Kurt Simpson has been doing well. He continues to have some firmness/knot located in the area of the abscess with a small amount of waxing and waning drainage. Drainage is primarily bloody with occasional purulence. No fevers, chills or sweats.   No Known Allergies    Outpatient Medications Prior to Visit  Medication Sig Dispense Refill  . Dolutegravir-lamiVUDine (DOVATO) 50-300 MG TABS Take 1 tablet by mouth daily. 30 tablet 5  . doxycycline (VIBRAMYCIN) 100 MG capsule Take 1 capsule (100 mg total) by mouth 2 (two) times daily. (Patient not taking: Reported on 10/29/2018) 14 capsule 0   No facility-administered medications prior to visit.      Past Medical History:  Diagnosis Date  . Allergy   . HIV infection (HCC)   . Mononucleosis   . Palpitations   . Vitamin D deficiency      Review of Systems  Constitutional: Negative for chills and fever.  Respiratory: Negative for chest tightness, shortness of breath and wheezing.   Cardiovascular: Negative for chest pain.  Genitourinary:       Positive for gluteal abscess      Objective:    BP 113/68   Pulse 88   Temp 98.6 F (37 C)   Wt 144 lb (65.3 kg)   BMI 20.96 kg/m  Nursing note and vital signs reviewed.  Physical Exam Constitutional:      General: He is not in acute distress.    Appearance: He is well-developed.  Cardiovascular:     Rate and Rhythm: Normal rate and regular rhythm.     Heart sounds: Normal heart sounds.  Pulmonary:     Effort: Pulmonary effort is normal.     Breath sounds: Normal breath sounds.  Genitourinary:    Comments: Abscess located on the right gluteal around the 8'oclock position from the rectum. There remains  firmness with no significant tenderness. Unable to express drainage.  Neurological:     Mental Status: He is alert.        Assessment & Plan:   Problem List Items Addressed This Visit      Other   Gluteal abscess - Primary    Kurt Simpson appears to be healing well from incision and drainage of his abscess although I have concern the abscess may still be present. Will continue to allow to drain and if not improved in the next several days will refer to general surgery for further intervention. Advised to monitor for signs of infection and keep the site clean with soap and water.           I am having Kurt Simpson maintain his Dolutegravir-lamiVUDine and doxycycline.    Follow-up: As needed .   Marcos Eke, MSN, FNP-C Nurse Practitioner [facility] for Infectious Disease [facility] Office phone: [phone] Pager: [phone] [facility] Main number: [phone]

## 2018-11-16 ENCOUNTER — Ambulatory Visit (INDEPENDENT_AMBULATORY_CARE_PROVIDER_SITE_OTHER): Payer: 59 | Admitting: Infectious Diseases

## 2018-11-16 VITALS — BP 101/65 | HR 69 | Temp 98.4°F | Wt 147.0 lb

## 2018-11-16 DIAGNOSIS — Z0000 Encounter for general adult medical examination without abnormal findings: Secondary | ICD-10-CM

## 2018-11-16 DIAGNOSIS — Z21 Asymptomatic human immunodeficiency virus [HIV] infection status: Secondary | ICD-10-CM | POA: Diagnosis not present

## 2018-11-16 DIAGNOSIS — Z872 Personal history of diseases of the skin and subcutaneous tissue: Secondary | ICD-10-CM | POA: Diagnosis not present

## 2018-11-16 DIAGNOSIS — Z23 Encounter for immunization: Secondary | ICD-10-CM

## 2018-11-16 DIAGNOSIS — Z79899 Other long term (current) drug therapy: Secondary | ICD-10-CM

## 2018-11-16 NOTE — Progress Notes (Signed)
Name: Kurt Simpson  DOB: 07/20/1996 MRN: [id number] PCP: Steele Sizer, MD   Patient Active Problem List   Diagnosis Date Noted  . HIV (human immunodeficiency virus infection) ([place]) 07/30/2018  . Healthcare maintenance 07/30/2018  . Enlarged tonsils 04/25/2016  . Vitamin D deficiency 11/25/2009  . Allergic rhinitis 01/17/2009     Brief Narrative:  Kurt Simpson is a 23 y.o. male with HIV infection diagnosed in October 2019. History of OIs: none. HIV Risk: MSM/bisexual. CD4 nadir 310/VL 263,000. PYP^P5093 (-), Quantiferon (-). Hep B sAg (-)  Previous Regimens: . naive  Genotypes: . 07/2018 - no significant mutations   Subjective:  CC:  HIV follow up care. No complaints.   HPI:  Kurt Simpson continues to do well taking his Dovato. He has missed no doses since our last office visit - has adjusted taking his medications from at night to the mornings. He puts his pill bottle right next to his badge in the AM which has worked very well for him. He still has moments where he is "affected" by his diagnosis. Feels that he is still learning what this means for him as he was very surprised at the diagnosis. He does feel this is getting better and has more moments where he does not think about it. His family is very supportive for him but he does not usually display his emotions outwardly to them and "keeps things in."   He otherwise has been in good health aside from a viral cold that has since resolved and a buttock abscess that has healed nicely after drainage (small firm nodule remains).    Review of Systems  Constitutional: Negative for chills and fever.  HENT: Negative for tinnitus.   Eyes: Negative for blurred vision and photophobia.  Respiratory: Negative for cough and sputum production.   Cardiovascular: Negative for chest pain.  Gastrointestinal: Negative for diarrhea, nausea and vomiting.  Genitourinary: Negative for dysuria.  Skin: Negative for rash.  Neurological: Negative for  headaches.  Psychiatric/Behavioral: Depression: sad mood occasionally. The patient does not have insomnia.     Past Medical History:  Diagnosis Date  . Allergy   . HIV infection ([place])   . Mononucleosis   . Palpitations   . Vitamin D deficiency     Outpatient Medications Prior to Visit  Medication Sig Dispense Refill  . Dolutegravir-lamiVUDine (DOVATO) 50-300 MG TABS Take 1 tablet by mouth daily. 30 tablet 5  . doxycycline (VIBRAMYCIN) 100 MG capsule Take 1 capsule (100 mg total) by mouth 2 (two) times daily. (Patient not taking: Reported on 10/29/2018) 14 capsule 0   No facility-administered medications prior to visit.     No Known Allergies  Social History   Tobacco Use  . Smoking status: Never Smoker  . Smokeless tobacco: Never Used  Substance Use Topics  . Alcohol use: No    Alcohol/week: 0.0 standard drinks    Comment: experimented   . Drug use: No    Family History  Problem Relation Age of Onset  . Healthy Mother   . Migraines Mother   . Healthy Father     Social History   Substance and Sexual Activity  Sexual Activity Not Currently  . Partners: Female  . Birth control/protection: Condom     Objective:   Vitals:   11/16/18 0939  BP: 101/65  Pulse: 69  Temp: 98.4 F (36.9 C)  TempSrc: Oral  Weight: 147 lb (66.7 kg)   Body mass index is 21.4 kg/m.  Physical Exam  Vitals signs reviewed.  Constitutional:      Appearance: Normal appearance. He is not ill-appearing.  Eyes:     General: No scleral icterus. Cardiovascular:     Rate and Rhythm: Normal rate and regular rhythm.     Pulses: Normal pulses.     Heart sounds: No murmur.  Pulmonary:     Effort: Pulmonary effort is normal.     Breath sounds: Normal breath sounds.  Abdominal:     Palpations: Abdomen is soft.     Tenderness: There is no abdominal tenderness.  Musculoskeletal: Normal range of motion.  Skin:    General: Skin is warm and dry.     Capillary Refill: Capillary refill takes  less than 2 seconds.  Neurological:     Mental Status: He is alert and oriented to person, place, and time.     Lab Results Lab Results  Component Value Date   WBC 5.3 07/15/2018   HGB 14.2 07/15/2018   HCT 43.3 07/15/2018   MCV 76.1 (L) 07/15/2018   PLT 288 07/15/2018    Lab Results  Component Value Date   CREATININE 0.91 07/15/2018   BUN 11 07/15/2018   NA 138 07/15/2018   K 4.3 07/15/2018   CL 102 07/15/2018   CO2 29 07/15/2018    Lab Results  Component Value Date   ALT 21 07/15/2018   AST 14 07/15/2018   ALKPHOS 67 05/10/2018   BILITOT 0.4 07/15/2018    Lab Results  Component Value Date   CHOL 129 07/15/2018   HDL 27 (L) 07/15/2018   LDLCALC 87 07/15/2018   TRIG 67 07/15/2018   CHOLHDL 4.8 07/15/2018   HIV 1 RNA Quant (copies/mL)  Date Value  09/17/2018 <20 DETECTED (A)  07/15/2018 263,000 (H)   CD4 T Cell Abs (/uL)  Date Value  09/17/2018 680  07/15/2018 310 (L)     Assessment & Plan:   Problem List Items Addressed This Visit      Unprioritized   Healthcare maintenance    Pneumovax today - next due in 11-2023 Hep B 3/3 in June 2020       HIV (human immunodeficiency virus infection) ([place]) - Primary (Chronic)    We spent time looking back at his labs today over the last 4 months. Celebrated the fact that 2 months ago he was already undetectable on his medications. CD4 up to >600 as well. He would like to repeat his viral load today for more personal reassurrance since he switched taking medications from night to day. I am very proud of him in that he has had ongoing self-critique to help him stay compliant with his medications - this will be a skill that serves him well.  Offered [facility] dental team however he prefers to find private dentist at this time.  Offered counseling services however he declined for now.  He will return in 4 months for follow up care.       Relevant Orders   HIV-1 RNA quant-no reflex-bld    Other Visit Diagnoses    Need  for pneumococcal vaccination       Relevant Orders   Pneumococcal polysaccharide vaccine 23-valent greater than or equal to 2yo subcutaneous/IM (Completed)     Return in about 19 weeks (around 03/29/2019) for follow up, labs.   Janene Madeira, MSN, NP-C [facility] for Infectious [place] Pager: [phone] Office: [phone]  11/16/18  10:17 AM

## 2018-11-16 NOTE — Assessment & Plan Note (Signed)
We spent time looking back at his labs today over the last 4 months. Celebrated the fact that 2 months ago he was already undetectable on his medications. CD4 up to >600 as well. He would like to repeat his viral load today for more personal reassurrance since he switched taking medications from night to day. I am very proud of him in that he has had ongoing self-critique to help him stay compliant with his medications - this will be a skill that serves him well.  Offered [facility] dental team however he prefers to find private dentist at this time.  Offered counseling services however he declined for now.  He will return in 4 months for follow up care.

## 2018-11-16 NOTE — Patient Instructions (Signed)
Your labs look great from 2 months ago. Your viral load is undetectable and your medication is working well for you.   Vaccines given today:  Pneumovax --> next dose due in 5 years  Last Hepatitis B vaccine is due in June.   Will plan on seeing you back in late June.   Be well!

## 2018-11-16 NOTE — Assessment & Plan Note (Signed)
Pneumovax today - next due in 11-2023 Hep B 3/3 in June 2020

## 2018-11-18 LAB — HIV-1 RNA QUANT-NO REFLEX-BLD
HIV 1 RNA Quant: 33 copies/mL — ABNORMAL HIGH
HIV-1 RNA Quant, Log: 1.52 Log copies/mL — ABNORMAL HIGH

## 2018-11-23 MED FILL — DOVATO 50-300 MG TABS: 50-300 | 30 days supply | Qty: 30 | Fill #4

## 2018-11-24 ENCOUNTER — Ambulatory Visit (INDEPENDENT_AMBULATORY_CARE_PROVIDER_SITE_OTHER): Payer: 59 | Admitting: Emergency Medicine

## 2018-11-24 DIAGNOSIS — Z23 Encounter for immunization: Secondary | ICD-10-CM | POA: Diagnosis not present

## 2018-12-23 MED FILL — DOVATO 50-300 MG TABS: 50-300 | 30 days supply | Qty: 30 | Fill #5

## 2019-01-08 ENCOUNTER — Other Ambulatory Visit: Payer: Self-pay

## 2019-01-08 ENCOUNTER — Encounter: Payer: Self-pay | Admitting: Family Medicine

## 2019-01-08 ENCOUNTER — Ambulatory Visit (INDEPENDENT_AMBULATORY_CARE_PROVIDER_SITE_OTHER): Payer: 59 | Admitting: Family Medicine

## 2019-01-08 DIAGNOSIS — R634 Abnormal weight loss: Secondary | ICD-10-CM

## 2019-01-08 DIAGNOSIS — Z21 Asymptomatic human immunodeficiency virus [HIV] infection status: Secondary | ICD-10-CM | POA: Diagnosis not present

## 2019-01-08 DIAGNOSIS — L739 Follicular disorder, unspecified: Secondary | ICD-10-CM

## 2019-01-08 DIAGNOSIS — J302 Other seasonal allergic rhinitis: Secondary | ICD-10-CM | POA: Diagnosis not present

## 2019-01-08 NOTE — Progress Notes (Signed)
Name: Kurt Simpson   MRN: [id number]    DOB: Mar 18, 1996   Date:01/08/2019       Progress Note  Subjective  Chief Complaint  Chief Complaint  Patient presents with  . Follow-up    I connected with [name] on 01/08/19 at  8:00 AM EDT by a video enabled telemedicine application and verified that I am speaking with the correct person using two identifiers.  I discussed the limitations of evaluation and management by telemedicine and the availability of in person appointments. The patient expressed understanding and agreed to proceed. Staff also discussed with the patient that there may be a patient responsible charge related to this service. Patient Location: Home Provider Location: Home Additional Individuals present: None  HPI  HIV: Diagnosed in October 2019, taking dovato.  Denies fatigue.  Did have gluteal abscess was I&D'd by infectious disease and is doing well since then. No drainage, states area is healing well.  Allergies: Taking loratadine.  Declines nasal spray. Seems to be somewhat controlled.  Unintentional Weight loss: He notes weight has been fairly stable.  He is up 10lbs from our last visit to his last visit in February with ID.  He is eating whatever he wants right now.  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   Cyst to LEFT neck - has been present since January - has decreased in size since then.  The area is right along his hairline/beard border.  Not painful or tender, has had a little bit of blood come out occasionally. No redness or heat.  Patient Active Problem List   Diagnosis Date Noted  . HIV (human immunodeficiency virus infection) (HCC) 07/30/2018  . Healthcare maintenance 07/30/2018  . Enlarged tonsils 04/25/2016  . Vitamin D deficiency 11/25/2009  . Allergic rhinitis 01/17/2009    Past Surgical History:  Procedure Laterality Date  . NO PAST SURGERIES    . TONSILLECTOMY AND ADENOIDECTOMY N/A 09/08/2018   Procedure:  TONSILLECTOMY AND possible  ADENOIDECTOMY;  Surgeon: Geanie Logan, MD;  [facility];  Service: ENT;  Laterality: N/A;    Family History  Problem Relation Age of Onset  . Healthy Mother   . Migraines Mother   . Healthy Father     Social History   Socioeconomic History  . Marital status: Single    Spouse name: Not on file  . Number of children: Not on file  . Years of education: Not on file  . Highest education level: Not on file  Occupational History    Comment: call center  Social Needs  . Financial resource strain: Not hard at all  . Food insecurity:    Worry: Never true    Inability: Never true  . Transportation needs:    Medical: No    Non-medical: No  Tobacco Use  . Smoking status: Never Smoker  . Smokeless tobacco: Never Used  Substance and Sexual Activity  . Alcohol use: No    Alcohol/week: 0.0 standard drinks    Comment: experimented   . Drug use: No  . Sexual activity: Not Currently    Partners: Female    Birth control/protection: Condom  Lifestyle  . Physical activity:    Days per week: 0 days    Minutes per session: 0 min  . Stress: Not at all  Relationships  . Social connections:    Talks on phone: More than three times a week    Gets together: More than three times  a week    Attends religious service: More than 4 times per year    Active member of club or organization: Yes    Attends meetings of clubs or organizations: More than 4 times per year    Relationship status: Never married  . Intimate partner violence:    Fear of current or ex partner: No    Emotionally abused: No    Physically abused: No    Forced sexual activity: No  Other Topics Concern  . Not on file  Social History Narrative   He lives with his mother, step-dad , younger sister.    He was taking classes a [employer], but has taken some time off. He plans on going back.   He is working at [employer] center , Museum/gallery curator     Current  Outpatient Medications:  .  Dolutegravir-lamiVUDine (DOVATO) 50-300 MG TABS, Take 1 tablet by mouth daily., Disp: 30 tablet, Rfl: 5  No Known Allergies  I personally reviewed active problem list, medication list, allergies, notes from last encounter, lab results with the patient/caregiver today.   ROS  Constitutional: Negative for fever or weight change.  Respiratory: Negative for cough and shortness of breath.   Cardiovascular: Negative for chest pain or palpitations.  Gastrointestinal: Negative for abdominal pain, no bowel changes.  Musculoskeletal: Negative for gait problem or joint swelling.  Skin: Negative for rash. See HPI Neurological: Negative for dizziness or headache.  No other specific complaints in a complete review of systems (except as listed in HPI above).  Objective  Virtual encounter, vitals not obtained.  There is no height or weight on file to calculate BMI.  Physical Exam  Constitutional: Patient appears well-developed and well-nourished. No distress.  HENT: Head: Normocephalic and atraumatic.  Neck: Normal range of motion. Pulmonary/Chest: Effort normal. No respiratory distress. Speaking in complete sentences Neurological: Pt is alert and oriented to person, place, and time. Coordination, speech and gait are normal.  Psychiatric: Patient has a normal mood and affect. behavior is normal. Judgment and thought content normal. Skin: Small cystic lesion on the LEFT lower neck along the hairline of the beard consistent with folliculitis.  No visible erythema or drainage.  No results found for this or any previous visit (from the past 72 hour(s)).   PHQ2/9: Depression screen [facility] 2/9 01/08/2019 11/16/2018 10/29/2018 07/30/2018 07/14/2018  Decreased Interest 0 0 0 0 0  Down, Depressed, Hopeless 0 0 0 0 0  PHQ - 2 Score 0 0 0 0 0  Altered sleeping 0 - - - 0  Tired, decreased energy 0 - - - 0  Change in appetite 0 - - - 0  Feeling bad or failure about yourself  0 - -  - 0  Trouble concentrating 0 - - - 0  Moving slowly or fidgety/restless 0 - - - 0  Suicidal thoughts 0 - - - 0  PHQ-9 Score 0 - - - 0  Difficult doing work/chores Not difficult at all - - - Not difficult at all   PHQ-2/9 Result is negative.    Fall Risk: Fall Risk  01/08/2019 11/16/2018 10/29/2018 07/30/2018 07/14/2018  Falls in the past year? 0 0 0 No No  Number falls in past yr: 0 0 - - -  Injury with Fall? 0 - - - -  Follow up Falls evaluation completed - - - -    Assessment & Plan  1. Asymptomatic HIV infection (HCC) - Keep follow up with ID, recommend continuation  of medications and maintaining routine follow ups for labs.  He has been very compliant with his medication and has had no SE's.  Doing well overall.  2. Unintentional weight loss - Stable at this time - up 10lbs as of 11/2018.    3. Seasonal allergic rhinitis, unspecified trigger - Taking claritin PRN, declines nasal spray.  4. Folliculitis - Monitor, states has been improving, so we will have him follow up in 4 weeks if not improving.  I discussed the assessment and treatment plan with the patient. The patient was provided an opportunity to ask questions and all were answered. The patient agreed with the plan and demonstrated an understanding of the instructions.  The patient was advised to call back or seek an in-person evaluation if the symptoms worsen or if the condition fails to improve as anticipated.  I provided 13 minutes of non-face-to-face time during this encounter.

## 2019-01-21 MED FILL — DOVATO 50-300 MG TABS: 50-300 | 30 days supply | Qty: 30 | Fill #0

## 2019-02-19 MED FILL — DOVATO 50-300 MG TABS: 50-300 | 30 days supply | Qty: 30 | Fill #1

## 2019-03-22 MED FILL — DOVATO 50-300 MG TABS: 50-300 | 30 days supply | Qty: 30 | Fill #2

## 2019-03-29 ENCOUNTER — Encounter: Payer: Self-pay | Admitting: Infectious Diseases

## 2019-03-29 ENCOUNTER — Ambulatory Visit (INDEPENDENT_AMBULATORY_CARE_PROVIDER_SITE_OTHER): Payer: 59 | Admitting: Infectious Diseases

## 2019-03-29 ENCOUNTER — Other Ambulatory Visit (HOSPITAL_COMMUNITY)
Admission: RE | Admit: 2019-03-29 | Discharge: 2019-03-29 | Disposition: A | Discharge status: Home / Self Care | Payer: 59 | Source: Ambulatory Visit | Attending: Infectious Diseases | Admitting: Infectious Diseases

## 2019-03-29 ENCOUNTER — Other Ambulatory Visit: Payer: Self-pay

## 2019-03-29 VITALS — BP 109/72 | HR 89 | Temp 98.5°F | Wt 134.0 lb

## 2019-03-29 DIAGNOSIS — Z21 Asymptomatic human immunodeficiency virus [HIV] infection status: Secondary | ICD-10-CM

## 2019-03-29 DIAGNOSIS — Z113 Encounter for screening for infections with a predominantly sexual mode of transmission: Secondary | ICD-10-CM | POA: Insufficient documentation

## 2019-03-29 DIAGNOSIS — Z0000 Encounter for general adult medical examination without abnormal findings: Secondary | ICD-10-CM

## 2019-03-29 MED ORDER — DOVATO 50-300 MG PO TABS: 1.0000 | ORAL_TABLET | Freq: Every day | ORAL | 11 refills | Status: DC

## 2019-03-29 NOTE — Assessment & Plan Note (Signed)
Check Hep B sAb today as it sounds like his vaccines were completed with the new Heplisav vaccine through his PCP's office.

## 2019-03-29 NOTE — Patient Instructions (Addendum)
Nice to see you as always.   Will release your results from today's blood and urine collection when I get them in.   I sent in refills of your Dovato today - you will have enough for a year.   Will see you back in 3 months

## 2019-03-29 NOTE — Assessment & Plan Note (Signed)
Seems to be doing well on his Dovato. Refills provided today. Will check VL/CD4 today.  He feels comfortable with Q40m follow up at this time.  Declined condoms.  STI testing as outlined below.

## 2019-03-29 NOTE — Progress Notes (Signed)
Name: Kurt Simpson  DOB: 1996-07-04 MRN: [id number] PCP: Steele Sizer, MD   Patient Active Problem List   Diagnosis Date Noted  . Healthcare maintenance 03/29/2019  . Asymptomatic HIV infection ([place]) 07/30/2018  . Vitamin D deficiency 11/25/2009  . Allergic rhinitis 01/17/2009     Brief Narrative:  Kurt Simpson is a 23 y.o. male with HIV infection diagnosed in October 2019. History of OIs: none.  HIV Risk: MSM/bisexual.  CD4 nadir 310/VL 263,000.  YNW^[id number] (-), Quantiferon (-). Hep B sAg (-)  Previous Regimens: . naive  Genotypes: . 07/2018 - no significant mutations   Subjective:  CC:  HIV follow up care. No complaints.   HPI:  Kurt Simpson is doing well and has no complaints today. He contnues taking his Dovato every day and cites no missed doses.  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  He is requesting to repeat his blood work today, he is taking his Dovato at a different time of the day and wants to make sure this has not had an effect on his blood work.  He is also requesting STI testing today.  Declines versatile MSM contact and only requesting urine testing.  Declines any symptoms.  Would also like syphilis testing today.  No known contact but just wants to make certain he has not picked up anything.  He says he thinks he got a shot with his primary care provider with his hepatitis B vaccine.  He thinks this has completed his series.    Review of Systems  Constitutional: Negative for chills and fever.  HENT: Negative for tinnitus.   Eyes: Negative for blurred vision and photophobia.  Respiratory: Negative for cough and sputum production.   Cardiovascular: Negative for chest pain.  Gastrointestinal: Negative for diarrhea, nausea and vomiting.  Genitourinary: Negative for dysuria.  Skin: Negative for rash.   Neurological: Negative for headaches.  Psychiatric/Behavioral: The patient does not have insomnia.     Past Medical History:  Diagnosis Date  . Allergy   . Enlarged tonsils 04/25/2016  . HIV infection ([place])   . Mononucleosis   . Palpitations   . Vitamin D deficiency     Outpatient Medications Prior to Visit  Medication Sig Dispense Refill  . Dolutegravir-lamiVUDine (DOVATO) 50-300 MG TABS Take 1 tablet by mouth daily. 30 tablet 5   No facility-administered medications prior to visit.     No Known Allergies  Social History   Tobacco Use  . Smoking status: Never Smoker  . Smokeless tobacco: Never Used  Substance Use Topics  . Alcohol use: No    Alcohol/week: 0.0 standard drinks    Comment: experimented   . Drug use: No    Family History  Problem Relation Age of Onset  . Healthy Mother   . Migraines Mother   . Healthy Father     Social History   Substance and Sexual Activity  Sexual Activity Not Currently  . Partners: Female  . Birth control/protection: Condom     Objective:   Vitals:   03/29/19 1340  BP: 109/72  Pulse: 89  Temp: 98.5 F (36.9 C)  TempSrc: Oral  Weight: 134 lb (60.8 kg)   Body mass index is 19.5 kg/m.  Physical Exam Vitals signs reviewed.  Constitutional:      Appearance: Normal appearance. He is not  ill-appearing.  Eyes:     General: No scleral icterus. Cardiovascular:     Rate and Rhythm: Normal rate and regular rhythm.     Pulses: Normal pulses.     Heart sounds: No murmur.  Pulmonary:     Effort: Pulmonary effort is normal.     Breath sounds: Normal breath sounds.  Abdominal:     Palpations: Abdomen is soft.     Tenderness: There is no abdominal tenderness.  Musculoskeletal: Normal range of motion.  Skin:    General: Skin is warm and dry.     Capillary Refill: Capillary refill takes less than 2 seconds.  Neurological:     Mental Status: He is alert and oriented to person, place, and time.    Lab Results Lab  Results  Component Value Date   WBC 5.3 07/15/2018   HGB 14.2 07/15/2018   HCT 43.3 07/15/2018   MCV 76.1 (L) 07/15/2018   PLT 288 07/15/2018    Lab Results  Component Value Date   CREATININE 0.91 07/15/2018   BUN 11 07/15/2018   NA 138 07/15/2018   K 4.3 07/15/2018   CL 102 07/15/2018   CO2 29 07/15/2018    Lab Results  Component Value Date   ALT 21 07/15/2018   AST 14 07/15/2018   ALKPHOS 67 05/10/2018   BILITOT 0.4 07/15/2018    Lab Results  Component Value Date   CHOL 129 07/15/2018   HDL 27 (L) 07/15/2018   LDLCALC 87 07/15/2018   TRIG 67 07/15/2018   CHOLHDL 4.8 07/15/2018   HIV 1 RNA Quant (copies/mL)  Date Value  11/16/2018 33 (H)  09/17/2018 <20 DETECTED (A)  07/15/2018 263,000 (H)   CD4 T Cell Abs (/uL)  Date Value  09/17/2018 680  07/15/2018 310 (L)     Assessment & Plan:   Problem List Items Addressed This Visit      Unprioritized   Asymptomatic HIV infection ([place])    Seems to be doing well on his Dovato. Refills provided today. Will check VL/CD4 today.  He feels comfortable with Q8mfollow up at this time.  Declined condoms.  STI testing as outlined below.       Relevant Medications   Dolutegravir-lamiVUDine (DOVATO) 50-300 MG TABS   Other Relevant Orders   HIV-1 RNA quant-no reflex-bld   T-helper cell (CD4)- ([facility] clinic only)   Healthcare maintenance    Check Hep B sAb today as it sounds like his vaccines were completed with the new Heplisav vaccine through his PCP's office.        Other Visit Diagnoses    Routine screening for STI (sexually transmitted infection)    -  Primary   Relevant Orders   RPR   Urine cytology ancillary only   Hepatitis B surface antibody,qualitative      SJanene Madeira MSN, NP-C [facility] [email or web address] Pager: [phone]: [phone] Main Line: [phone]  03/29/19  2:16 PM

## 2019-03-30 LAB — T-HELPER CELL (CD4) - (RCID CLINIC ONLY)
CD4 % Helper T Cell: 34 % (ref 33–65)
CD4 T Cell Abs: 556 /uL (ref 400–1790)

## 2019-03-31 LAB — RPR: RPR Ser Ql: NONREACTIVE

## 2019-03-31 LAB — HEPATITIS B SURFACE ANTIBODY,QUALITATIVE: Hep B S Ab: REACTIVE — AB

## 2019-03-31 LAB — URINE CYTOLOGY ANCILLARY ONLY
Chlamydia: NEGATIVE
Neisseria Gonorrhea: NEGATIVE

## 2019-03-31 LAB — HIV-1 RNA QUANT-NO REFLEX-BLD
HIV 1 RNA Quant: 27 copies/mL — ABNORMAL HIGH
HIV-1 RNA Quant, Log: 1.43 Log copies/mL — ABNORMAL HIGH

## 2019-04-01 IMAGING — CT CT NECK W/ CM
3 of 5 series · 12 of 33 positions shown, 14 images · IV contrast (omnipaque)
Comparison: None.

CLINICAL DATA: Sore throat over the last 2 weeks. Antibiotic
treatment.

EXAM:
CT NECK WITH CONTRAST
TECHNIQUE: Multidetector CT imaging of the neck was performed using the
standard protocol following the bolus administration of intravenous
contrast.
CONTRAST:  75mL OMNIPAQUE IOHEXOL 300 MG/ML  SOLN

[Series 6: sag neck · sagittal · 0.44mm/px · 5 of 81 slices shown, 6 images]
[im 27/81  bone]
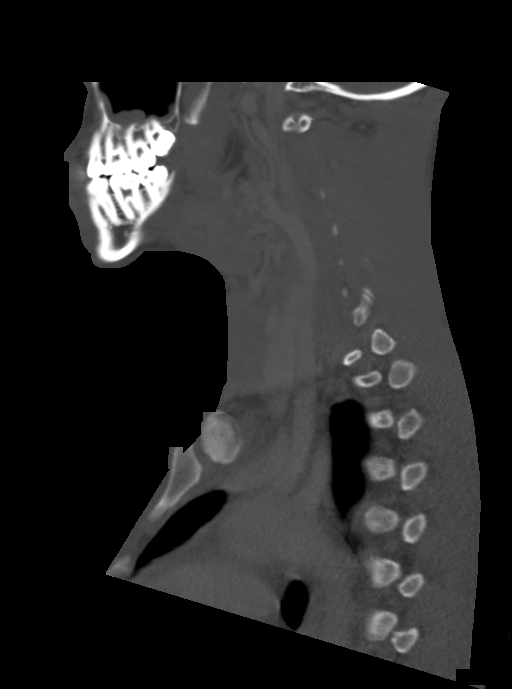
[im 34/81  bone]
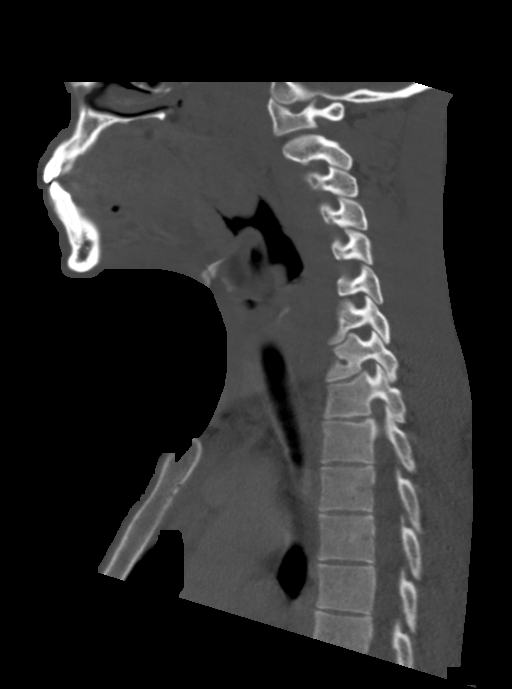
[im 41/81  soft-tissue]
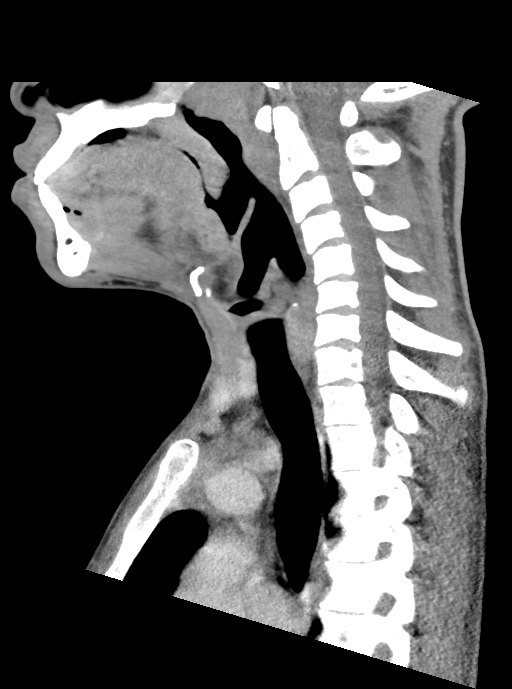
[im 41/81  bone]
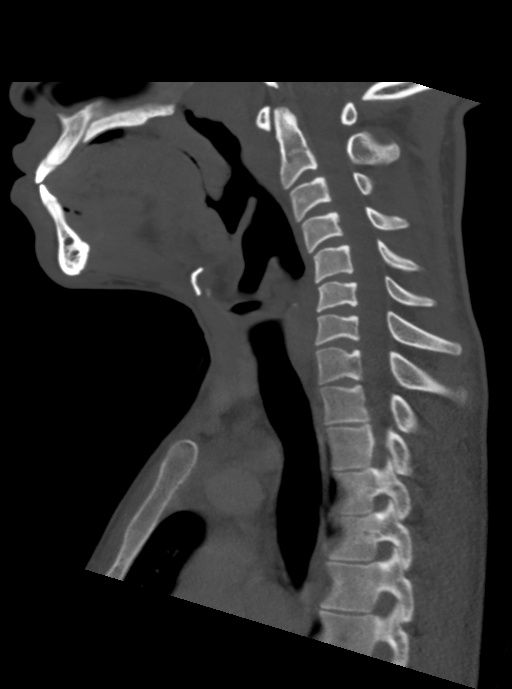
[im 47/81  bone]
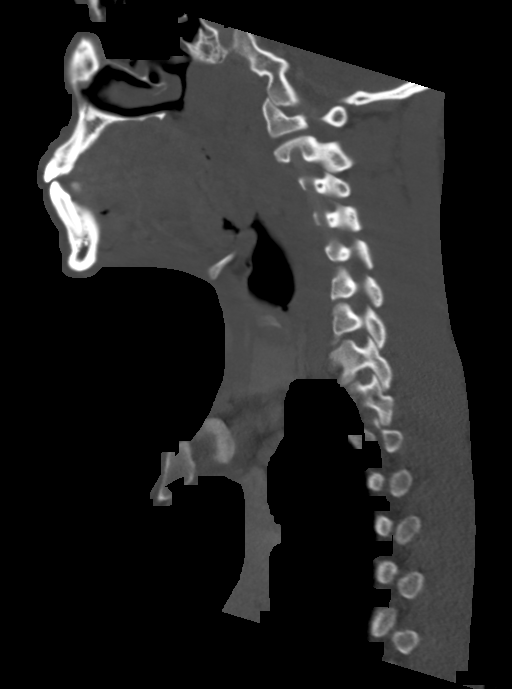
[im 54/81  bone]
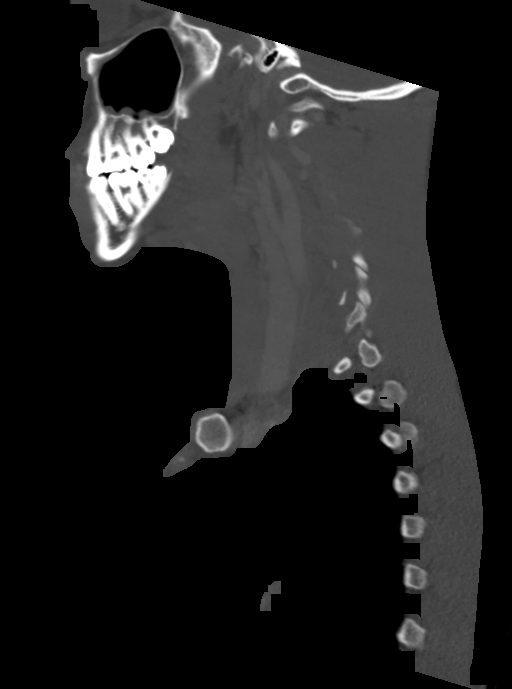

[Series 7: cor neck · coronal · 0.46mm/px · 3 of 108 slices shown]
[im 33/108  bone]
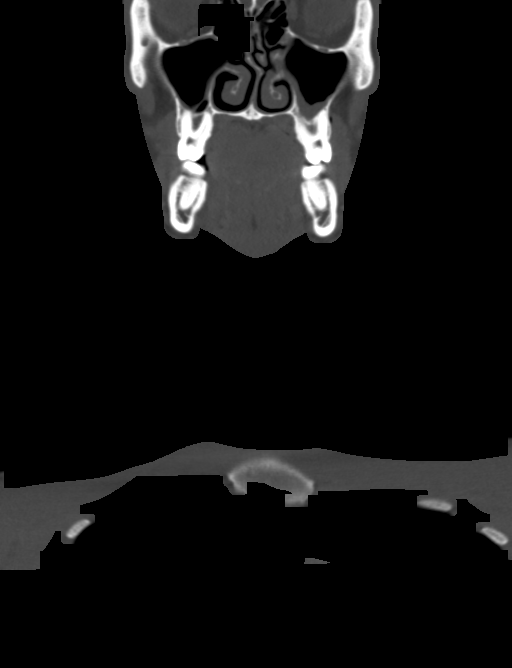
[im 47/108  bone]
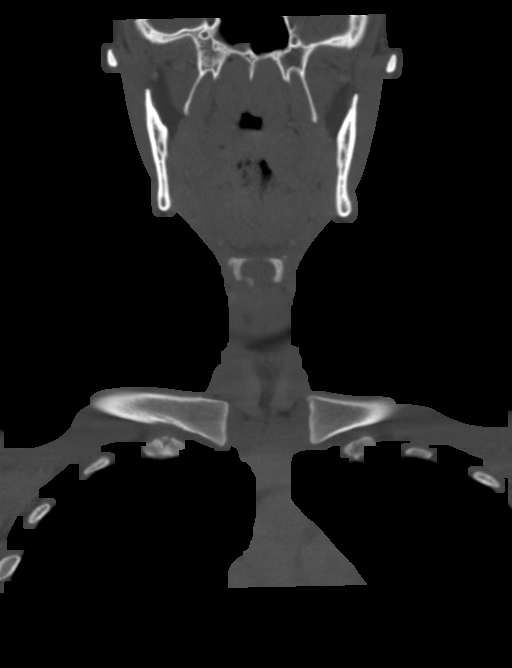
[im 61/108  bone]
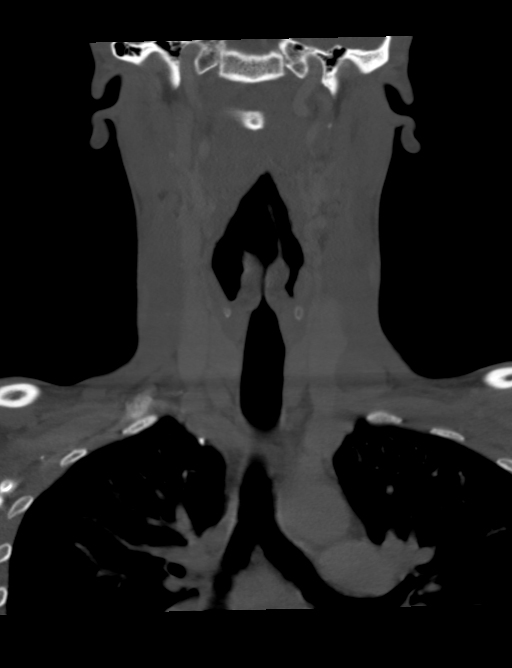

[Series 8: orthogonal ax · axial · 0.35mm/px · z∈[-309,-152]mm · 4 of 142 slices shown, 5 images]
[im 29/142  soft-tissue]
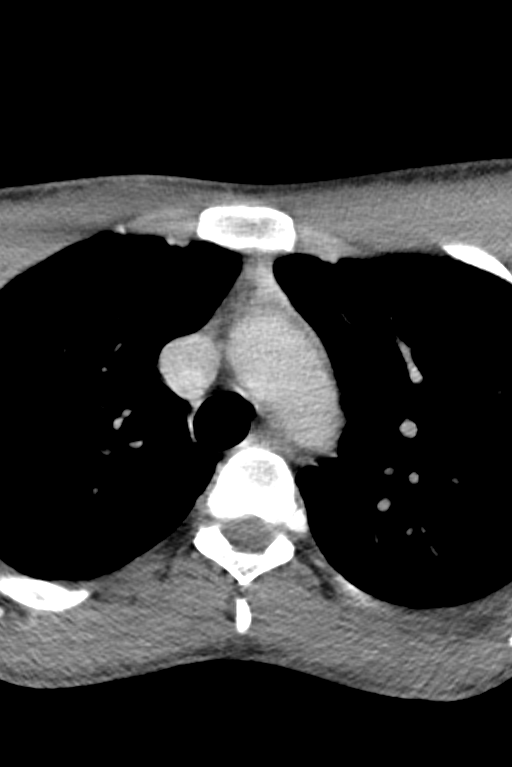
[im 29/142  bone]
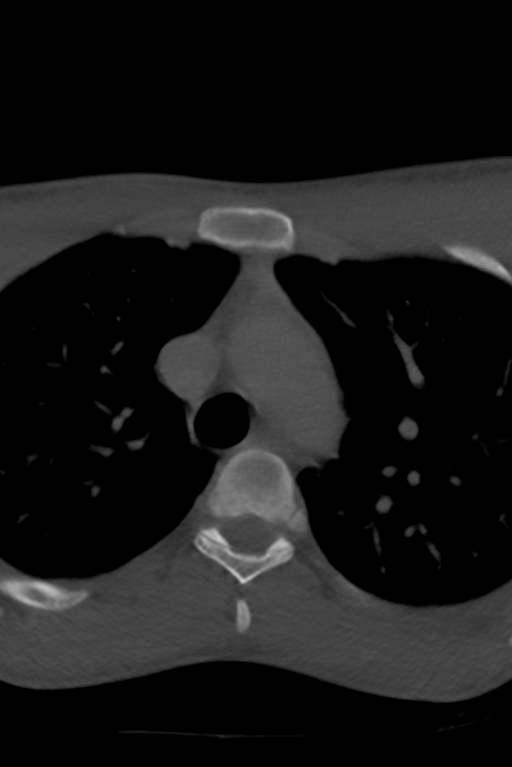
[im 57/142  bone]
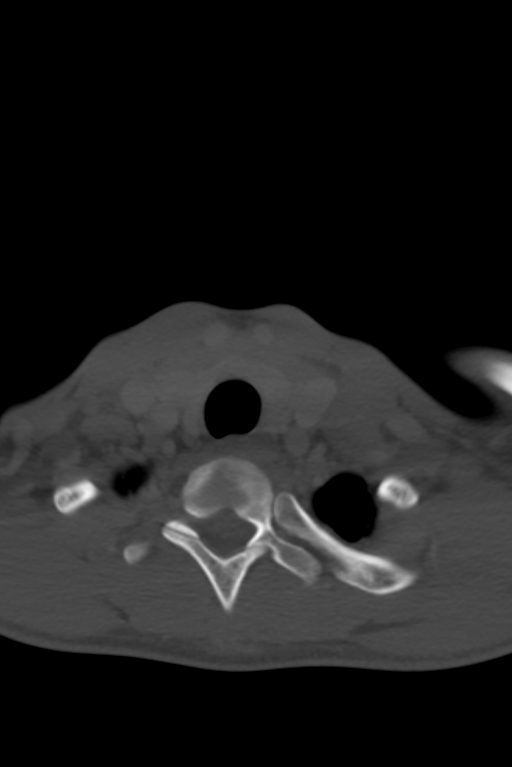
[im 85/142  bone]
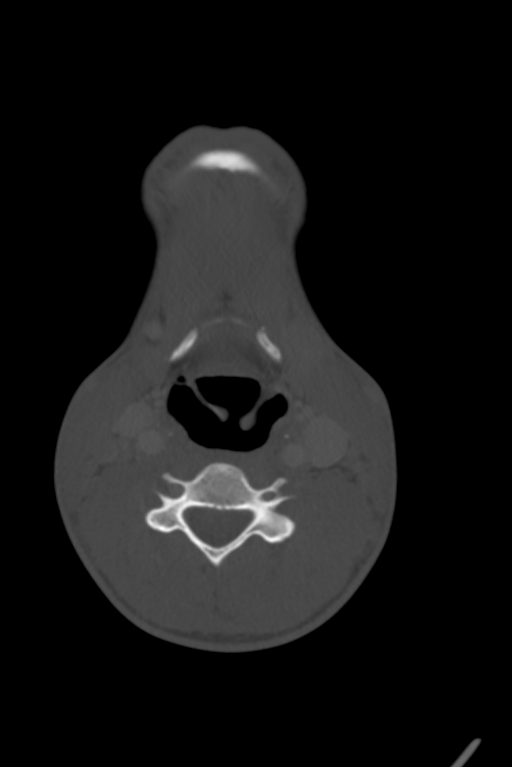
[im 113/142  bone]
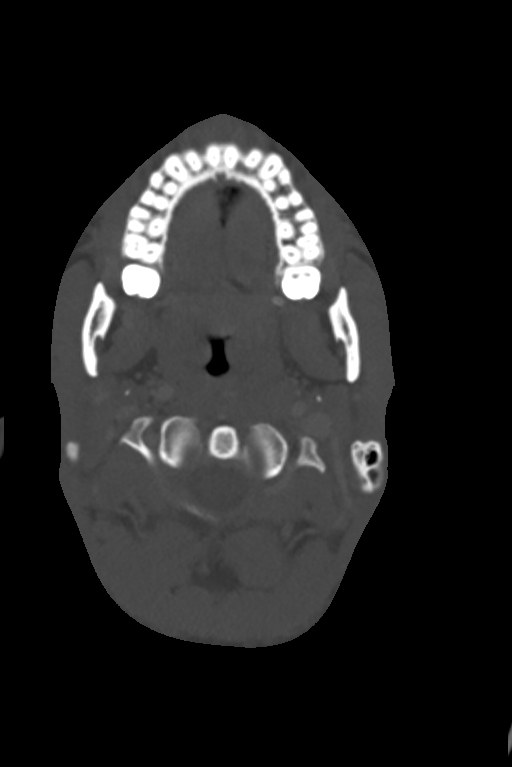

[12 of 33 positions shown; findings below may reference images not displayed]

FINDINGS: Pharynx and larynx: Pharyngitis and tonsillitis pattern with mucosal
thickening and enhancement but no evidence of tonsillar or
peritonsillar abscess or retropharyngeal effusion.

Salivary glands: Parotid and submandibular glands are normal.

Thyroid: Normal

Lymph nodes: Lymph nodes are within normal limits without evidence
of asymmetric enlargement or low-density nodes.

Vascular: Normal

Limited intracranial: Normal

Visualized orbits: Normal

Mastoids and visualized paranasal sinuses: Mild mucosal thickening
of the maxillary sinus floors. No significant sinus finding.

Skeleton: Mild spinal curvature.

Upper chest: Normal

Other: None
IMPRESSION: Pharyngitis and tonsillitis pattern without evidence of tonsillar or
peritonsillar abscess or retropharyngeal effusion.

## 2019-04-15 ENCOUNTER — Other Ambulatory Visit: Payer: Self-pay

## 2019-04-15 ENCOUNTER — Ambulatory Visit (INDEPENDENT_AMBULATORY_CARE_PROVIDER_SITE_OTHER): Payer: 59 | Admitting: Family

## 2019-04-15 ENCOUNTER — Encounter: Payer: Self-pay | Admitting: Family

## 2019-04-15 ENCOUNTER — Other Ambulatory Visit (HOSPITAL_COMMUNITY)
Admission: RE | Admit: 2019-04-15 | Discharge: 2019-04-15 | Disposition: A | Discharge status: Home / Self Care | Payer: 59 | Source: Ambulatory Visit | Attending: Family | Admitting: Family

## 2019-04-15 VITALS — BP 116/74 | HR 75 | Temp 99.1°F | Wt 133.0 lb

## 2019-04-15 DIAGNOSIS — Z113 Encounter for screening for infections with a predominantly sexual mode of transmission: Secondary | ICD-10-CM | POA: Diagnosis present

## 2019-04-15 DIAGNOSIS — L290 Pruritus ani: Secondary | ICD-10-CM | POA: Insufficient documentation

## 2019-04-15 NOTE — Patient Instructions (Signed)
Nice to see you.  We will check your lab work today and let you know the results once available.   Please use Sitz baths to help with your itching as there may be a hemorrhoid.  If your symptoms do not improve, please let us know.  Have a great day and stay safe!

## 2019-04-15 NOTE — Progress Notes (Signed)
Subjective:    Patient ID: Kurt Simpson, male    DOB: Dec 28, 1995, 23 y.o.   MRN: [id number]  Chief Complaint  Patient presents with  . Follow-up     HPI:  Kurt Simpson is a 23 y.o. male with HIV disease presenting today for an acute office visit. Describing an area around his rectum described as a bump that is itchy that has been going on for about 1 week. It is slightly improved today since onset. In the same area as previously drained abscess. No fevers, chills or sweats. Believes it may have gotten smaller. No problems with bowel movements, but did have an episode of constipation about 2 weeks ago which has since resolved. Has had rectal intercourse with same partner. Has not been using condoms.    No Known Allergies    Outpatient Medications Prior to Visit  Medication Sig Dispense Refill  . Dolutegravir-lamiVUDine (DOVATO) 50-300 MG TABS Take 1 tablet by mouth daily. 30 tablet 11   No facility-administered medications prior to visit.      Past Medical History:  Diagnosis Date  . Allergy   . Enlarged tonsils 04/25/2016  . HIV infection ([place])   . Mononucleosis   . Palpitations   . Vitamin D deficiency      Past Surgical History:  Procedure Laterality Date  . NO PAST SURGERIES    . TONSILLECTOMY AND ADENOIDECTOMY N/A 09/08/2018   Procedure: TONSILLECTOMY AND possible  ADENOIDECTOMY;  Surgeon: Clyde Canterbury, MD;  Location: [facility];  Service: ENT;  Laterality: N/A;     Review of Systems  Constitutional: Negative for appetite change, chills, fatigue, fever and unexpected weight change.  Eyes: Negative for visual disturbance.  Respiratory: Negative for cough, chest tightness, shortness of breath and wheezing.   Cardiovascular: Negative for chest pain and leg swelling.  Gastrointestinal: Negative for abdominal pain, constipation, diarrhea, nausea and vomiting.  Genitourinary: Negative for dysuria, flank pain, frequency, genital sores, hematuria and  urgency.  Skin: Negative for rash.  Allergic/Immunologic: Negative for immunocompromised state.  Neurological: Negative for dizziness and headaches.      Objective:    BP 116/74   Pulse 75   Temp 99.1 F (37.3 C) (Oral)   Wt 133 lb (60.3 kg)   BMI 19.36 kg/m  Nursing note and vital signs reviewed.  Physical Exam Constitutional:      General: He is not in acute distress.    Appearance: He is well-developed.  Cardiovascular:     Rate and Rhythm: Normal rate and regular rhythm.     Heart sounds: Normal heart sounds.  Pulmonary:     Effort: Pulmonary effort is normal.     Breath sounds: Normal breath sounds.  Genitourinary:    Comments: Small skin lesions noted at the 2 o'clock and 9 o'clock position of the rectum. No palpable abscess or notable hemorrhoid.  Skin:    General: Skin is warm and dry.  Neurological:     Mental Status: He is alert and oriented to person, place, and time.  Psychiatric:        Behavior: Behavior normal.        Thought Content: Thought content normal.        Judgment: Judgment normal.        Assessment & Plan:   Problem List Items Addressed This Visit      Musculoskeletal and Integument   Rectal itching    No obvious lesions, masses, or abscesses present.  Possible early wart development.  Likely hemorrhoids causing the burning/itching.  Treat conservatively with over-the-counter medications as needed for symptom relief and supportive care.  Follow-up if symptoms do not resolve or worsen.      Relevant Orders   Cytology (oral, anal, urethral) ancillary only   Cytology (oral, anal, urethral) ancillary only     Other   Screening for STDs (sexually transmitted diseases) - Primary     Remains sexually active.  Discussed importance of safe sexual practice to reduce risk of acquisition/transmission of STI.  He has concerns his partner has a new partner.  Check STI per his request.      Relevant Orders   Cytology (oral, anal, urethral)  ancillary only   Cytology (oral, anal, urethral) ancillary only       I am having Lelon MastAaron Kielty maintain his Dovato.    Follow-up: Return if symptoms worsen or fail to improve.   Marcos EkeGreg Xadrian Craighead, MSN, FNP-C Nurse Practitioner [facility] Main number: [phone]

## 2019-04-15 NOTE — Assessment & Plan Note (Signed)
No obvious lesions, masses, or abscesses present.  Possible early wart development.  Likely hemorrhoids causing the burning/itching.  Treat conservatively with over-the-counter medications as needed for symptom relief and supportive care.  Follow-up if symptoms do not resolve or worsen.

## 2019-04-15 NOTE — Assessment & Plan Note (Signed)
   Remains sexually active.  Discussed importance of safe sexual practice to reduce risk of acquisition/transmission of STI.  He has concerns his partner has a new partner.  Check STI per his request.

## 2019-04-16 LAB — CYTOLOGY, (ORAL, ANAL, URETHRAL) ANCILLARY ONLY
Chlamydia: NEGATIVE
Chlamydia: NEGATIVE
Neisseria Gonorrhea: NEGATIVE
Neisseria Gonorrhea: NEGATIVE

## 2019-04-20 MED FILL — DOVATO 50-300 MG TABS: 50-300 | 30 days supply | Qty: 30 | Fill #3

## 2019-05-20 MED FILL — DOVATO 50-300 MG TABS: 50-300 | 30 days supply | Qty: 30 | Fill #4

## 2019-06-17 MED FILL — DOVATO 50-300 MG TABS: 50-300 | 30 days supply | Qty: 30 | Fill #5

## 2019-06-29 ENCOUNTER — Ambulatory Visit: Payer: 59 | Admitting: Infectious Diseases

## 2019-07-21 MED FILL — DOVATO 50-300 MG TABS: 50-300 | 30 days supply | Qty: 30 | Fill #0

## 2019-08-20 MED FILL — DOVATO 50-300 MG TABS: 50-300 | 30 days supply | Qty: 30 | Fill #1

## 2019-08-26 ENCOUNTER — Telehealth: Payer: Self-pay

## 2019-08-26 NOTE — Telephone Encounter (Signed)
COVID-19 Pre-Screening Questions:08/26/19   Do you currently have a fever (>100 F), chills or unexplained body aches? NO   Are you currently experiencing new cough, shortness of breath, sore throat, runny nose? NO  .  Have you recently travelled outside the state of [place] in the last 14 days? N O .  Have you been in contact with someone that is currently pending confirmation of Covid19 testing or has been confirmed to have the Covid19 virus?  NO  **If the patient answers NO to ALL questions -  advise the patient to please call the clinic before coming to the office should any symptoms develop.     

## 2019-08-27 ENCOUNTER — Ambulatory Visit (INDEPENDENT_AMBULATORY_CARE_PROVIDER_SITE_OTHER): Payer: 59 | Admitting: Infectious Diseases

## 2019-08-27 ENCOUNTER — Encounter: Payer: Self-pay | Admitting: Infectious Diseases

## 2019-08-27 ENCOUNTER — Other Ambulatory Visit: Payer: Self-pay

## 2019-08-27 VITALS — BP 129/82 | HR 80 | Temp 98.0°F | Wt 149.0 lb

## 2019-08-27 DIAGNOSIS — Z21 Asymptomatic human immunodeficiency virus [HIV] infection status: Secondary | ICD-10-CM | POA: Diagnosis not present

## 2019-08-27 DIAGNOSIS — Z23 Encounter for immunization: Secondary | ICD-10-CM

## 2019-08-27 DIAGNOSIS — Z79899 Other long term (current) drug therapy: Secondary | ICD-10-CM

## 2019-08-27 DIAGNOSIS — Z113 Encounter for screening for infections with a predominantly sexual mode of transmission: Secondary | ICD-10-CM | POA: Diagnosis not present

## 2019-08-27 LAB — T-HELPER CELL (CD4) - (RCID CLINIC ONLY)
CD4 % Helper T Cell: 29 % — ABNORMAL LOW (ref 33–65)
CD4 T Cell Abs: 410 /uL (ref 400–1790)

## 2019-08-27 NOTE — Patient Instructions (Signed)
Nice to see you today.   Please continue your Dovato every day as you are doing now. Will release your lab results to your MYChart once available.   If you are interested we can work to do a video visit next time - schedule a lab visit in 4 months with a Video Visit 2 weeks later. All you need is a smart phone, tablet or computer with video and audio.   You can check in all before the visit on your MyChart easily.

## 2019-08-27 NOTE — Assessment & Plan Note (Signed)
Declined today.  Will offer at upcoming appointment.

## 2019-08-27 NOTE — Progress Notes (Signed)
Name: Kurt Simpson  DOB: 12/27/1995 MRN: [id number] PCP: Steele Sizer, MD   Patient Active Problem List   Diagnosis Date Noted  . Rectal itching 04/15/2019  . Screening for STDs (sexually transmitted diseases) 03/29/2019  . Asymptomatic HIV infection (Kurt Simpson) 07/30/2018  . Vitamin D deficiency 11/25/2009  . Allergic rhinitis 01/17/2009     Brief Narrative:  Kurt Simpson is a 23 y.o. male with HIV infection diagnosed in October 2019. History of OIs: none.  HIV Risk: MSM/bisexual.  CD4 nadir 310/VL 263,000.  IZT^I4580 (-), Quantiferon (-). Hep B sAg (-)  Previous Regimens: . naive  Genotypes: . 07/2018 - no significant mutations   Subjective:  CC:  HIV follow up care. No complaints. Interested about injectable HIV treatment.    HPI:  Kurt Simpson is doing well and has no complaints today. He contnues taking his Dovato every day without any missed doses.  He has had no side effects or concerns with access to his medication.  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  His 1 year anniversary from his original diagnosis was about a month ago when he tells me he did notice a significant change in his mood during this timeframe.  He says that it sometimes affects his relationships especially sexual months.  He has not had any relationships or sexual encounters since her last office visit.  He does disclose his status to any potential partners.  He knows he is undetectable and his Dovato is working well which makes him feel better, however the daily reminder of having to take a pill gets to him sometimes.  He is very interested in the injectable medications and has a lot of questions around these.  He has not yet had the flu shot but is willing.  Sleeping and eating well with no activity change or weight change.   Review of Systems  Constitutional: Negative for chills and fever.  HENT: Negative for tinnitus.    Eyes: Negative for blurred vision and photophobia.  Respiratory: Negative for cough and sputum production.   Cardiovascular: Negative for chest pain.  Gastrointestinal: Negative for diarrhea, nausea and vomiting.  Genitourinary: Negative for dysuria.  Skin: Negative for rash.  Neurological: Negative for headaches.  Psychiatric/Behavioral: The patient does not have insomnia.     Past Medical History:  Diagnosis Date  . Allergy   . Enlarged tonsils 04/25/2016  . HIV infection ([place])   . Mononucleosis   . Palpitations   . Vitamin D deficiency     Outpatient Medications Prior to Visit  Medication Sig Dispense Refill  . Dolutegravir-lamiVUDine (DOVATO) 50-300 MG TABS Take 1 tablet by mouth daily. 30 tablet 11   No facility-administered medications prior to visit.     No Known Allergies  Social History   Tobacco Use  . Smoking status: Never Smoker  . Smokeless tobacco: Never Used  Substance Use Topics  . Alcohol use: No    Alcohol/week: 0.0 standard drinks    Comment: experimented   . Drug use: No    Social History   Substance and Sexual Activity  Sexual Activity Not Currently  . Partners: Female  . Birth control/protection: Condom     Objective:   Vitals:   08/27/19 0856  BP: 129/82  Pulse: 80  Temp: 98 F (36.7 C)  TempSrc: Oral  Weight: 149 lb (67.6 kg)   Body mass index is 21.69 kg/m.  Physical  Exam Constitutional:      Appearance: He is well-developed.     Comments: Seated comfortably in chair during visit.   HENT:     Mouth/Throat:     Dentition: Normal dentition. No dental abscesses.  Cardiovascular:     Rate and Rhythm: Normal rate and regular rhythm.     Heart sounds: Normal heart sounds.  Pulmonary:     Effort: Pulmonary effort is normal.     Breath sounds: Normal breath sounds.  Abdominal:     General: There is no distension.     Palpations: Abdomen is soft.     Tenderness: There is no abdominal tenderness.  Lymphadenopathy:      Cervical: No cervical adenopathy.  Skin:    General: Skin is warm and dry.     Findings: No rash.  Neurological:     Mental Status: He is alert and oriented to person, place, and time.  Psychiatric:        Judgment: Judgment normal.     Comments: In good spirits today and engaged in care discussion.     Lab Results Lab Results  Component Value Date   WBC 5.3 07/15/2018   HGB 14.2 07/15/2018   HCT 43.3 07/15/2018   MCV 76.1 (L) 07/15/2018   PLT 288 07/15/2018    Lab Results  Component Value Date   CREATININE 0.91 07/15/2018   BUN 11 07/15/2018   NA 138 07/15/2018   K 4.3 07/15/2018   CL 102 07/15/2018   CO2 29 07/15/2018    Lab Results  Component Value Date   ALT 21 07/15/2018   AST 14 07/15/2018   ALKPHOS 67 05/10/2018   BILITOT 0.4 07/15/2018    Lab Results  Component Value Date   CHOL 129 07/15/2018   HDL 27 (L) 07/15/2018   LDLCALC 87 07/15/2018   TRIG 67 07/15/2018   CHOLHDL 4.8 07/15/2018   HIV 1 RNA Quant (copies/mL)  Date Value  03/29/2019 27 (H)  11/16/2018 33 (H)  09/17/2018 <20 DETECTED (A)   CD4 T Cell Abs (/uL)  Date Value  03/29/2019 556  09/17/2018 680  07/15/2018 310 (L)     Assessment & Plan:   Problem List Items Addressed This Visit      Unprioritized   Screening for STDs (sexually transmitted diseases)    Declined today.  Will offer at upcoming appointment.      Relevant Orders   Urine GC / C   RPR   Asymptomatic HIV infection ([place]) - Primary (Chronic)    Seems to be doing very well on his Dovato and is taking it correctly every day.  He is very interested in his treatment to include long-acting injectables.  He does struggle with daily pills and the meaning surrounding the medication. We will check a viral load CD4 count c-Met and CBC and fasting lipids today.  He can come back in 4 months for a video visit and labs before for routine care. Flu shot given today and otherwise up to date for vaccines for PLWH.       Relevant  Orders   HIV 1 RNA quant-no reflex-bld   T-helper cell (CD4)- ([facility] clinic only)   COMPLETE METABOLIC PANEL WITH GFR   CBC with Differential   HIV 1 RNA quant-no reflex-bld   T-helper cell (CD4)- ([facility] clinic only)    Other Visit Diagnoses    Encounter for long-term current use of high risk medication       Relevant  Orders   Lipid Profile      Janene Madeira, MSN, NP-C [facility] [email or web address] Pager: [phone] Office: [place]: [phone]   08/27/19  9:24 AM

## 2019-08-27 NOTE — Assessment & Plan Note (Signed)
Seems to be doing very well on his Dovato and is taking it correctly every day.  He is very interested in his treatment to include long-acting injectables.  He does struggle with daily pills and the meaning surrounding the medication. We will check a viral load CD4 count c-Met and CBC and fasting lipids today.  He can come back in 4 months for a video visit and labs before for routine care. Flu shot given today and otherwise up to date for vaccines for PLWH.

## 2019-09-03 LAB — CBC WITH DIFFERENTIAL/PLATELET
Absolute Monocytes: 505 cells/uL (ref 200–950)
Basophils Absolute: 38 cells/uL (ref 0–200)
Basophils Relative: 1 %
Eosinophils Absolute: 118 cells/uL (ref 15–500)
Eosinophils Relative: 3.1 %
HCT: 43.2 % (ref 38.5–50.0)
Hemoglobin: 14.2 g/dL (ref 13.2–17.1)
Lymphs Abs: 1288 cells/uL (ref 850–3900)
MCH: 27.2 pg (ref 27.0–33.0)
MCHC: 32.9 g/dL (ref 32.0–36.0)
MCV: 82.8 fL (ref 80.0–100.0)
MPV: 9.8 fL (ref 7.5–12.5)
Monocytes Relative: 13.3 %
Neutro Abs: 1851 cells/uL (ref 1500–7800)
Neutrophils Relative %: 48.7 %
Platelets: 279 10*3/uL (ref 140–400)
RBC: 5.22 10*6/uL (ref 4.20–5.80)
RDW: 13.8 % (ref 11.0–15.0)
Total Lymphocyte: 33.9 %
WBC: 3.8 10*3/uL (ref 3.8–10.8)

## 2019-09-03 LAB — COMPLETE METABOLIC PANEL WITH GFR
AG Ratio: 1.8 (calc) (ref 1.0–2.5)
ALT: 9 U/L (ref 9–46)
AST: 14 U/L (ref 10–40)
Albumin: 4.5 g/dL (ref 3.6–5.1)
Alkaline phosphatase (APISO): 83 U/L (ref 36–130)
BUN: 11 mg/dL (ref 7–25)
CO2: 25 mmol/L (ref 20–32)
Calcium: 9.5 mg/dL (ref 8.6–10.3)
Chloride: 106 mmol/L (ref 98–110)
Creat: 1.01 mg/dL (ref 0.60–1.35)
GFR, Est African American: 122 mL/min/{1.73_m2} (ref >=60)
GFR, Est Non African American: 105 mL/min/{1.73_m2} (ref >=60)
Globulin: 2.5 g/dL (calc) (ref 1.9–3.7)
Glucose, Bld: 84 mg/dL (ref 65–99)
Potassium: 4.5 mmol/L (ref 3.5–5.3)
Sodium: 140 mmol/L (ref 135–146)
Total Bilirubin: 0.4 mg/dL (ref 0.2–1.2)
Total Protein: 7 g/dL (ref 6.1–8.1)

## 2019-09-03 LAB — LIPID PANEL
Cholesterol: 159 mg/dL (ref <200)
HDL: 43 mg/dL (ref >=40)
LDL Cholesterol (Calc): 102 mg/dL (calc) — ABNORMAL HIGH
Non-HDL Cholesterol (Calc): 116 mg/dL (calc) (ref <130)
Total CHOL/HDL Ratio: 3.7 (calc) (ref <5.0)
Triglycerides: 46 mg/dL (ref <150)

## 2019-09-03 LAB — HIV-1 RNA QUANT-NO REFLEX-BLD
HIV 1 RNA Quant: <20 copies/mL
HIV-1 RNA Quant, Log: <1.3 Log copies/mL

## 2019-09-16 MED FILL — DOVATO 50-300 MG TABS: 50-300 | 30 days supply | Qty: 30 | Fill #2

## 2019-09-23 ENCOUNTER — Ambulatory Visit (INDEPENDENT_AMBULATORY_CARE_PROVIDER_SITE_OTHER): Payer: 59 | Admitting: Family Medicine

## 2019-09-23 ENCOUNTER — Other Ambulatory Visit: Payer: Self-pay

## 2019-09-23 ENCOUNTER — Encounter: Payer: Self-pay | Admitting: Family Medicine

## 2019-09-23 ENCOUNTER — Other Ambulatory Visit (HOSPITAL_COMMUNITY)
Admission: RE | Admit: 2019-09-23 | Discharge: 2019-09-23 | Disposition: A | Discharge status: Home / Self Care | Payer: 59 | Source: Ambulatory Visit | Attending: Family Medicine | Admitting: Family Medicine

## 2019-09-23 DIAGNOSIS — Z202 Contact with and (suspected) exposure to infections with a predominantly sexual mode of transmission: Secondary | ICD-10-CM | POA: Insufficient documentation

## 2019-09-23 NOTE — Progress Notes (Signed)
Name: Kurt Simpson   MRN: [id number]    DOB: Apr 19, 1996   Date:09/23/2019       Progress Note  Subjective  Chief Complaint  Chief Complaint  Patient presents with  . Exposure to STD    I connected with  [name] on 09/23/19 at  7:20 AM EST by telephone and verified that I am speaking with the correct person using two identifiers.  I discussed the limitations, risks, security and privacy concerns of performing an evaluation and management service by telephone and the availability of in person appointments. Staff also discussed with the patient that there may be a patient responsible charge related to this service. Patient Location: Home Provider Location: Office Additional Individuals present: None  HPI  About a week and a half ago he received a text message from a former sexual partner that they were positive for gonorrhea.  He did have oral sex with this partner. Did have negative GC testing 08/27/2019  Would like to proceed with oral/anal/penile testing. Denies any drainage in throat, from anus, or penis, no sore throat, no fevers/chills, no dysuria, no lesions.    Patient Active Problem List   Diagnosis Date Noted  . Rectal itching 04/15/2019  . Screening for STDs (sexually transmitted diseases) 03/29/2019  . Asymptomatic HIV infection ([facility]) 07/30/2018  . Vitamin D deficiency 11/25/2009  . Allergic rhinitis 01/17/2009    Past Surgical History:  Procedure Laterality Date  . NO PAST SURGERIES    . TONSILLECTOMY AND ADENOIDECTOMY N/A 09/08/2018   Procedure: TONSILLECTOMY AND possible  ADENOIDECTOMY;  Surgeon: Geanie Logan, MD;  Location: [facility];  Service: ENT;  Laterality: N/A;    Family History  Problem Relation Age of Onset  . Healthy Mother   . Migraines Mother   . Healthy Father     Social History   Socioeconomic History  . Marital status: Single    Spouse name: Not on file  . Number of children: Not on file  . Years of education: Not on  file  . Highest education level: Not on file  Occupational History    Comment: call center  Tobacco Use  . Smoking status: Never Smoker  . Smokeless tobacco: Never Used  Substance and Sexual Activity  . Alcohol use: No    Alcohol/week: 0.0 standard drinks    Comment: experimented   . Drug use: No  . Sexual activity: Not Currently    Partners: Female    Birth control/protection: Condom  Other Topics Concern  . Not on file  Social History Narrative   He lives with his mother, step-dad , younger sister.    He was taking classes a [employer], but has taken some time off. He plans on going back.   He is working at [employer] center , Museum/gallery curator   Social Determinants of Health   Financial Resource Strain:   . Difficulty of Paying Living Expenses: Not on file  Food Insecurity:   . Worried About Programme researcher, broadcasting/film/video in the Last Year: Not on file  . Ran Out of Food in the Last Year: Not on file  Transportation Needs:   . Lack of Transportation (Medical): Not on file  . Lack of Transportation (Non-Medical): Not on file  Physical Activity:   . Days of Exercise per Week: Not on file  . Minutes of Exercise per Session: Not on file  Stress:   . Feeling of Stress : Not on file  Social Connections:   .  Frequency of Communication with Friends and Family: Not on file  . Frequency of Social Gatherings with Friends and Family: Not on file  . Attends Religious Services: Not on file  . Active Member of Clubs or Organizations: Not on file  . Attends Archivist Meetings: Not on file  . Marital Status: Not on file  Intimate Partner Violence:   . Fear of Current or Ex-Partner: Not on file  . Emotionally Abused: Not on file  . Physically Abused: Not on file  . Sexually Abused: Not on file     Current Outpatient Medications:  .  Dolutegravir-lamiVUDine (DOVATO) 50-300 MG TABS, Take 1 tablet by mouth daily., Disp: 30 tablet, Rfl: 11  No Known Allergies  I  personally reviewed active problem list, medication list, allergies, notes from last encounter, lab results with the patient/caregiver today.   ROS  Ten systems reviewed and is negative except as mentioned in HPI  Objective  Virtual encounter, vitals not obtained.  There is no height or weight on file to calculate BMI.  Physical Exam  Pulmonary/Chest: Effort normal. No respiratory distress. Speaking in complete sentences Neurological: Pt is alert and oriented to person, place, and time. Speech is normal Psychiatric: Patient has a normal mood and affect. behavior is normal. Judgment and thought content normal.   No results found for this or any previous visit (from the past 72 hour(s)).  PHQ2/9: Depression screen [facility] 2/9 09/23/2019 08/27/2019 07/10/2018 05/05/2018 07/07/2017  Decreased Interest 0 0 0 0 0  Down, Depressed, Hopeless 0 0 0 0 1  PHQ - 2 Score 0 0 0 0 1  Altered sleeping - - 0 0 0  Tired, decreased energy - - 0 1 1  Change in appetite - - 0 0 0  Feeling bad or failure about yourself  - - 0 0 0  Trouble concentrating - - 0 - 0  Moving slowly or fidgety/restless - - 0 0 0  Suicidal thoughts - - 0 0 0  PHQ-9 Score - - 0 1 2  Difficult doing work/chores - - Not difficult at all Not difficult at all Not difficult at all  Some encounter information is confidential and restricted. Go to Review Flowsheets activity to see all data.   PHQ-2/9 Result is negative.    Fall Risk: Fall Risk  08/27/2019 07/10/2018 05/05/2018 11/17/2015 11/14/2015  Falls in the past year? 0 No No No No  Number falls in past yr: 0 - - - -  Injury with Fall? 0 - - - -  Some encounter information is confidential and restricted. Go to Review Flowsheets activity to see all data.    Assessment & Plan  1. Exposure to gonorrhea - Will obtain oral/anal/urethra samples for gonococcal testing per patient preference.  Had negative RPR and negative gon/chlam 08/27/2019.  He is HIV+ and compliant with  medications - doing well without issues.  - Cytology (oral, anal, urethral) ancillary only - Cytology (oral, anal, urethral) ancillary only - Cytology (oral, anal, urethral) ancillary only  I discussed the assessment and treatment plan with the patient. The patient was provided an opportunity to ask questions and all were answered. The patient agreed with the plan and demonstrated an understanding of the instructions.   The patient was advised to call back or seek an in-person evaluation if the symptoms worsen or if the condition fails to improve as anticipated.  I provided 11 minutes of non-face-to-face time during this encounter.  Hubbard Hartshorn, FNP

## 2019-09-24 LAB — CYTOLOGY, (ORAL, ANAL, URETHRAL) ANCILLARY ONLY
Chlamydia: NEGATIVE
Chlamydia: NEGATIVE
Chlamydia: NEGATIVE
Comment: NEGATIVE
Comment: NORMAL
Comment: NEGATIVE
Comment: NEGATIVE
Comment: NORMAL
Comment: NORMAL
Neisseria Gonorrhea: POSITIVE — AB
Neisseria Gonorrhea: NEGATIVE
Neisseria Gonorrhea: NEGATIVE

## 2019-09-26 ENCOUNTER — Encounter: Payer: Self-pay | Admitting: Family Medicine

## 2019-09-27 ENCOUNTER — Ambulatory Visit (INDEPENDENT_AMBULATORY_CARE_PROVIDER_SITE_OTHER): Payer: 59 | Admitting: Family Medicine

## 2019-09-27 ENCOUNTER — Other Ambulatory Visit: Payer: Self-pay

## 2019-09-27 ENCOUNTER — Encounter: Payer: Self-pay | Admitting: Family Medicine

## 2019-09-27 DIAGNOSIS — A545 Gonococcal pharyngitis: Secondary | ICD-10-CM | POA: Diagnosis not present

## 2019-09-27 MED ORDER — CEFTRIAXONE SODIUM 500 MG IJ SOLR
500.0000 mg | Freq: Once | INTRAMUSCULAR | Status: AC
  Administered 2019-09-27: 500 mg via INTRAMUSCULAR

## 2019-09-27 MED ORDER — AZITHROMYCIN 500 MG PO TABS
500.0000 mg | ORAL_TABLET | Freq: Once | ORAL | Status: AC
  Administered 2019-09-28: 500 mg via ORAL

## 2019-09-27 MED ORDER — CEFTRIAXONE SODIUM 250 MG IJ SOLR: 250.0000 mg | Freq: Once | INTRAMUSCULAR | Status: DC

## 2019-09-27 NOTE — Addendum Note (Signed)
Addended by: Steffan Caniglia G on: 09/27/2019 08:51 AM   Modules accepted: Orders

## 2019-09-27 NOTE — Progress Notes (Signed)
   Name: Kurt Simpson   MRN: [id number]    DOB: [birth year]   Date:09/27/2019       Progress Note  Subjective  Chief Complaint  No chief complaint on file.   HPI  Pt presents today for pharyngeal gonococcal treatment.  He denies throat pain, exudate; no fevers/chills, malaise.  Had unprotected receptive oral intercourse several weeks ago with a partner who later notified him that he had gonorrhea infection.  Pt was subsequently tested in our office - anal and urine specimens are negative, pharyngeal is positive.  Will treat with 1g Azithromycin, 250mg  Rocephin, and report to [facility] Department accordingly.    Patient Active Problem List   Diagnosis Date Noted  . Rectal itching 04/15/2019  . Screening for STDs (sexually transmitted diseases) 03/29/2019  . Asymptomatic HIV infection ([place]) 07/30/2018  . Vitamin D deficiency 11/25/2009  . Allergic rhinitis 01/17/2009    Social History   Tobacco Use  . Smoking status: Never Smoker  . Smokeless tobacco: Never Used  Substance Use Topics  . Alcohol use: No    Alcohol/week: 0.0 standard drinks    Comment: experimented      Current Outpatient Medications:  .  Dolutegravir-lamiVUDine (DOVATO) 50-300 MG TABS, Take 1 tablet by mouth daily., Disp: 30 tablet, Rfl: 11  Current Facility-Administered Medications:  .  azithromycin (ZITHROMAX) tablet 500 mg, 500 mg, Oral, Once, Hubbard Hartshorn, FNP .  cefTRIAXone (ROCEPHIN) injection 250 mg, 250 mg, Intramuscular, Once, Hubbard Hartshorn, FNP  No Known Allergies  I personally reviewed active problem list, medication list, allergies, health maintenance, notes from last encounter, lab results with the patient/caregiver today.  ROS  Ten systems reviewed and is negative except as mentioned in HPI  Objective Of note, patient preferred to remain in his car for the appointment today due to COVID-19 pandemic.  Vital signs were not obtained for this reason.  There is no height or weight on file  to calculate BMI.  Nursing Note and Vital Signs reviewed.  Physical Exam  Constitutional: Patient appears well-developed and well-nourished. No distress.  HENT: Head: Normocephalic and atraumatic. Ears: bilateral TMs with no erythema or effusion; Nose: Nose normal. Mouth/Throat: Oropharynx is clear and moist. No oropharyngeal exudate or tonsillar swelling.  Eyes: Conjunctivae and EOM are normal. No scleral icterus. Neck: Normal range of motion. Neck supple without lymphadenopathy. No JVD present. Cardiovascular: Normal rate, regular rhythm and normal heart sounds.  No murmur heard. No BLE edema. Pulmonary/Chest: Effort normal and breath sounds normal. No respiratory distress. Musculoskeletal: Normal range of motion, no joint effusions. No gross deformities Neurological: Pt is alert and oriented to person, place, and time.  Skin: Skin is warm and dry. No rash noted. No erythema.  Psychiatric: Patient has a normal mood and affect. behavior is normal. Judgment and thought content normal.   No results found for this or any previous visit (from the past 72 hour(s)).  Assessment & Plan  1. Gonorrhea of pharynx in male - Witnessed therapy. Discussed safe sexual practices - including consistent condom use for oral intercourse. - azithromycin (ZITHROMAX) tablet 500 mg - cefTRIAXone (ROCEPHIN) injection 250 mg  -Red flags and when to present for emergency care or RTC including fever >101.8F, chest pain, shortness of breath, new/worsening/un-resolving symptoms, reviewed with patient at time of visit. Follow up and care instructions discussed and provided in AVS.

## 2019-09-28 DIAGNOSIS — A545 Gonococcal pharyngitis: Secondary | ICD-10-CM | POA: Diagnosis not present

## 2019-10-18 MED FILL — DOVATO 50-300 MG TABS: 50-300 | 30 days supply | Qty: 30 | Fill #3

## 2019-11-15 MED FILL — DOVATO 50-300 MG TABS: 50-300 | 30 days supply | Qty: 30 | Fill #4

## 2019-12-16 MED FILL — DOVATO 50-300 MG TABS: 50-300 | 30 days supply | Qty: 30 | Fill #5

## 2019-12-30 ENCOUNTER — Ambulatory Visit: Payer: 59 | Attending: Internal Medicine

## 2019-12-30 DIAGNOSIS — Z23 Encounter for immunization: Secondary | ICD-10-CM

## 2019-12-30 NOTE — Progress Notes (Signed)
   Covid-19 Vaccination Clinic  Name:  Kurt Simpson    MRN: [id number] DOB: Aug 28, 1996  12/30/2019  [name] was observed post Covid-19 immunization for 15 minutes without incident. He was provided with Vaccine Information Sheet and instruction to access the V-Safe system.   [name] was instructed to call 911 with any severe reactions post vaccine: Marland Kitchen Difficulty breathing  . Swelling of face and throat  . A fast heartbeat  . A bad rash all over body  . Dizziness and weakness   Immunizations Administered    Name Date Dose VIS Date Route   Pfizer COVID-19 Vaccine 12/30/2019 10:37 AM 0.3 mL 09/17/2019 Intramuscular   Manufacturer: [employer], [employer]   Lot: [id number]   NDC: [address]-1

## 2020-01-03 ENCOUNTER — Other Ambulatory Visit: Payer: Self-pay

## 2020-01-03 ENCOUNTER — Other Ambulatory Visit: Payer: 59

## 2020-01-03 DIAGNOSIS — Z113 Encounter for screening for infections with a predominantly sexual mode of transmission: Secondary | ICD-10-CM

## 2020-01-03 DIAGNOSIS — Z21 Asymptomatic human immunodeficiency virus [HIV] infection status: Secondary | ICD-10-CM

## 2020-01-04 LAB — T-HELPER CELL (CD4) - (RCID CLINIC ONLY)
CD4 % Helper T Cell: 29 % — ABNORMAL LOW (ref 33–65)
CD4 T Cell Abs: 543 /uL (ref 400–1790)

## 2020-01-05 ENCOUNTER — Other Ambulatory Visit: Payer: 59

## 2020-01-06 LAB — HIV-1 RNA QUANT-NO REFLEX-BLD
HIV 1 RNA Quant: <20 copies/mL
HIV-1 RNA Quant, Log: <1.3 Log copies/mL

## 2020-01-06 LAB — RPR: RPR Ser Ql: NONREACTIVE

## 2020-01-17 ENCOUNTER — Encounter: Payer: Self-pay | Admitting: Infectious Diseases

## 2020-01-17 ENCOUNTER — Other Ambulatory Visit: Payer: Self-pay

## 2020-01-17 ENCOUNTER — Telehealth (INDEPENDENT_AMBULATORY_CARE_PROVIDER_SITE_OTHER): Payer: 59 | Admitting: Infectious Diseases

## 2020-01-17 DIAGNOSIS — Z5329 Procedure and treatment not carried out because of patient's decision for other reasons: Secondary | ICD-10-CM

## 2020-01-17 MED FILL — DOVATO 50-300 MG TABS: 50-300 | 30 days supply | Qty: 30 | Fill #6

## 2020-01-19 ENCOUNTER — Encounter: Payer: 59 | Admitting: Infectious Diseases

## 2020-01-21 ENCOUNTER — Telehealth (INDEPENDENT_AMBULATORY_CARE_PROVIDER_SITE_OTHER): Payer: 59 | Admitting: Infectious Diseases

## 2020-01-21 ENCOUNTER — Other Ambulatory Visit: Payer: Self-pay

## 2020-01-21 DIAGNOSIS — Z21 Asymptomatic human immunodeficiency virus [HIV] infection status: Secondary | ICD-10-CM

## 2020-01-21 DIAGNOSIS — Z113 Encounter for screening for infections with a predominantly sexual mode of transmission: Secondary | ICD-10-CM | POA: Diagnosis not present

## 2020-01-21 NOTE — Assessment & Plan Note (Signed)
Not currently sexually active - will continue to screen during scheduled office visits.

## 2020-01-21 NOTE — Progress Notes (Signed)
Name: Kurt Simpson  DOB: 01/17/1996 MRN: [id number] PCP: Hubbard Hartshorn, FNP   Virtual Visit via MyChart:  I connected with Kurt Simpson on 01/21/20 at  9:30 AM EDT by MyChart/Caregility and verified that I am speaking with the correct person using two identifiers.   I discussed the limitations, risks, security and privacy concerns of performing an evaluation and management service by telephone and the availability of in person appointments. I also discussed with the patient that there may be a patient responsible charge related to this service. The patient expressed understanding and agreed to proceed.  Patient Location: residence in [place] Provider Location: [facility]    Patient Active Problem List   Diagnosis Date Noted  . Rectal itching 04/15/2019  . Screening for STDs (sexually transmitted diseases) 03/29/2019  . Asymptomatic HIV infection ([place]) 07/30/2018  . Vitamin D deficiency 11/25/2009  . Allergic rhinitis 01/17/2009     Brief Narrative:  Kurt Simpson is a 24 y.o. male with HIV infection diagnosed in October 2019.  History of OIs: none.  HIV Risk: MSM/bisexual.  CD4 nadir 310/VL 263,000.  TAV^[id number] (-), Quantiferon (-). Hep B sAg (-)  Previous Regimens: . Dovato --> suppressed   Genotypes: . 07/2018 - no mutations    Subjective:  CC:  HIV follow up care. No concerns.  Interested about injectable HIV treatment.     HPI:  Kurt Simpson has been doing well since last OV. 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 He has been taking his Dovato everyday without missed dose or concern for side effect. Still finds taking pills daily a challenge and is curious about update for the new injectable option. Not currently sexually active. No physical/emotional concerns today. Has some questions about labs.   Received first Lindy vaccine and due for second dose next week. Otherwise no changes to health history. No new  medications.      Review of Systems  Constitutional: Negative for chills and fever.  HENT: Negative for tinnitus.   Eyes: Negative for blurred vision and photophobia.  Respiratory: Negative for cough and sputum production.   Cardiovascular: Negative for chest pain.  Gastrointestinal: Negative for diarrhea, nausea and vomiting.  Genitourinary: Negative for dysuria.  Skin: Negative for rash.  Neurological: Negative for headaches.  Psychiatric/Behavioral: The patient does not have insomnia.     Past Medical History:  Diagnosis Date  . Allergy   . Enlarged tonsils 04/25/2016  . HIV infection ([place])   . Mononucleosis   . Palpitations   . Vitamin D deficiency     Outpatient Medications Prior to Visit  Medication Sig Dispense Refill  . Dolutegravir-lamiVUDine (DOVATO) 50-300 MG TABS Take 1 tablet by mouth daily. 30 tablet 11   No facility-administered medications prior to visit.    No Known Allergies  Social History   Tobacco Use  . Smoking status: Never Smoker  . Smokeless tobacco: Never Used  Substance Use Topics  . Alcohol use: No    Alcohol/week: 0.0 standard drinks    Comment: experimented   . Drug use: No    Social History   Substance and Sexual Activity  Sexual Activity Not Currently  . Partners: Female  . Birth control/protection: Condom     Objective:   There were no vitals filed for this visit. There is no height or weight on file to calculate BMI.  Physical Exam Constitutional:      Appearance: Normal  appearance. He is not ill-appearing.  HENT:     Head: Normocephalic.     Mouth/Throat:     Mouth: Mucous membranes are moist.     Pharynx: Oropharynx is clear.  Eyes:     General: No scleral icterus. Pulmonary:     Effort: Pulmonary effort is normal.  Musculoskeletal:        General: Normal range of motion.     Cervical back: Normal range of motion.  Skin:    Coloration: Skin is not jaundiced or pale.  Neurological:     Mental Status: He  is alert and oriented to person, place, and time.  Psychiatric:        Mood and Affect: Mood normal.        Judgment: Judgment normal.    Lab Results Lab Results  Component Value Date   WBC 3.8 08/27/2019   HGB 14.2 08/27/2019   HCT 43.2 08/27/2019   MCV 82.8 08/27/2019   PLT 279 08/27/2019    Lab Results  Component Value Date   CREATININE 1.01 08/27/2019   BUN 11 08/27/2019   NA 140 08/27/2019   K 4.5 08/27/2019   CL 106 08/27/2019   CO2 25 08/27/2019    Lab Results  Component Value Date   ALT 9 08/27/2019   AST 14 08/27/2019   ALKPHOS 67 05/10/2018   BILITOT 0.4 08/27/2019    Lab Results  Component Value Date   CHOL 159 08/27/2019   HDL 43 08/27/2019   LDLCALC 102 (H) 08/27/2019   TRIG 46 08/27/2019   CHOLHDL 3.7 08/27/2019   HIV 1 RNA Quant (copies/mL)  Date Value  01/03/2020 <20 NOT DETECTED  08/27/2019 <20 NOT DETECTED  03/29/2019 27 (H)   CD4 T Cell Abs (/uL)  Date Value  01/03/2020 543  08/27/2019 410  03/29/2019 556     Assessment & Plan:   Problem List Items Addressed This Visit      Unprioritized   Screening for STDs (sexually transmitted diseases)    Not currently sexually active - will continue to screen during scheduled office visits.       Asymptomatic HIV infection ([place]) (Chronic)    Perfectly controlled on Dovato. He is tolerating this well and taking it correctly. No drug interactions noted today.  We spent a majority of the time discussing Cabeunva. Welcomed and answered all questions. Will come by to sign patient application at his convenience next week with pharmacy staff to see if insurance approves.  He would be interesting in learning injection himself - unclear if we will offer this in the future; at this time we would do the injections monthly and follow labs closely for him with switch.   RTC in 91mor sooner if he decides to switch ARV.   Hold on vaccines for now with COVID vaccine #2 dose.         Follow Up  Instructions: RTC 4 months for inperson    I discussed the assessment and treatment plan with the patient. The patient was provided an opportunity to ask questions and all were answered. The patient agreed with the plan and demonstrated an understanding of the instructions.   The patient was advised to call back or seek an in-person evaluation if the symptoms worsen or if the condition fails to improve as anticipated.  I provided 15 minutes of non-face-to-face time during this encounter.   SJanene Madeira MSN, NP-C [facility] [email or web address]  Pager: [phone] Office: [phone] [facility] Main Line: Coffey, MSN, NP-C [facility] [email or web address] Pager: [phone] Office: [place]: [phone]   01/21/20  10:09 AM

## 2020-01-21 NOTE — Patient Instructions (Addendum)
It was very nice to talk to you on the phone today.  I am glad to hear that you are staying well and keeping healthy.    Your medication is working perfectly for you and you are doing a great job taking it as prescribed  Please continue your DOVATO once a day as we discussed. Your labs are perfect!  Vaccines Recommended:   COVID vaccine #2   Recommended Next Office Visit:   4 months (or sooner if you switch to injection)  Please continue to be well, stay away from folks that are sick, wash her hands frequently, do not touch her face or mouth and continue to take your medications.   CABENUVA is the new medication to treat HIV. It is an injection in each butt cheek every 30 days at this time.   We would start with a pill "lead in" period for you where you take 2 pills once a day with food for 28 days to make sure you tolerate them well before we do the long acting injection.   Largely the side effects were of course related to the injection itself, but during the studies where this was used to treat HIV patients there were only a very small (< 5) number of patients that stopped it due to pain from the shots. Most of the patients in the trials reported overall improvement in the pain associated with the shots over time.   Patient Information Link: https://gskpro.com/content/dam/global/hcpportal/en_US/Prescribing_Information/Cabenuva/pdf/CABENUVA-PI-PIL-IFU2-IFU3.PDF#page=36

## 2020-01-21 NOTE — Assessment & Plan Note (Addendum)
Perfectly controlled on Dovato. He is tolerating this well and taking it correctly. No drug interactions noted today.  We spent a majority of the time discussing Cabeunva. Welcomed and answered all questions. Will come by to sign patient application at his convenience next week with pharmacy staff to see if insurance approves.  He would be interesting in learning injection himself - unclear if we will offer this in the future; at this time we would do the injections monthly and follow labs closely for him with switch.   RTC in 41m or sooner if he decides to switch ARV.   Hold on vaccines for now with COVID vaccine #2 dose.

## 2020-01-25 NOTE — Progress Notes (Signed)
Patient re-scheduled. Not seen today

## 2020-01-26 ENCOUNTER — Ambulatory Visit: Payer: 59 | Attending: Internal Medicine

## 2020-01-26 DIAGNOSIS — Z23 Encounter for immunization: Secondary | ICD-10-CM

## 2020-01-26 NOTE — Progress Notes (Signed)
   Covid-19 Vaccination Clinic  Name:  Kurt Simpson    MRN: [id number] DOB: [birth year]  01/26/2020  [name] was observed post Covid-19 immunization for 15 minutes without incident. He was provided with Vaccine Information Sheet and instruction to access the V-Safe system.   [name] was instructed to call 911 with any severe reactions post vaccine: Marland Kitchen Difficulty breathing  . Swelling of face and throat  . A fast heartbeat  . A bad rash all over body  . Dizziness and weakness   Immunizations Administered    Name Date Dose VIS Date Route   Pfizer COVID-19 Vaccine 01/26/2020 10:07 AM 0.3 mL 12/01/2018 Intramuscular   Manufacturer: [employer], [employer]   Lot: [id number]   NDC: [address]-2

## 2020-04-10 ENCOUNTER — Other Ambulatory Visit: Payer: Self-pay | Admitting: Infectious Diseases

## 2020-04-10 DIAGNOSIS — Z21 Asymptomatic human immunodeficiency virus [HIV] infection status: Secondary | ICD-10-CM

## 2020-04-11 ENCOUNTER — Other Ambulatory Visit: Payer: Self-pay | Admitting: Infectious Diseases

## 2020-04-13 MED FILL — DOVATO 50-300 MG TABS: 50-300 | 30 days supply | Qty: 30 | Fill #0

## 2020-04-15 ENCOUNTER — Emergency Department: Payer: 59

## 2020-04-15 ENCOUNTER — Encounter: Payer: Self-pay | Admitting: Intensive Care

## 2020-04-15 ENCOUNTER — Other Ambulatory Visit: Payer: Self-pay

## 2020-04-15 ENCOUNTER — Emergency Department
Admission: EM | Admit: 2020-04-15 | Discharge: 2020-04-15 | Disposition: A | Discharge status: Home / Self Care | Payer: 59 | Attending: Emergency Medicine | Admitting: Emergency Medicine

## 2020-04-15 DIAGNOSIS — S161XXA Strain of muscle, fascia and tendon at neck level, initial encounter: Secondary | ICD-10-CM | POA: Diagnosis not present

## 2020-04-15 DIAGNOSIS — Y9389 Activity, other specified: Secondary | ICD-10-CM | POA: Insufficient documentation

## 2020-04-15 DIAGNOSIS — Y999 Unspecified external cause status: Secondary | ICD-10-CM | POA: Diagnosis not present

## 2020-04-15 DIAGNOSIS — Y92410 Unspecified street and highway as the place of occurrence of the external cause: Secondary | ICD-10-CM | POA: Diagnosis not present

## 2020-04-15 DIAGNOSIS — R0789 Other chest pain: Secondary | ICD-10-CM | POA: Insufficient documentation

## 2020-04-15 DIAGNOSIS — S199XXA Unspecified injury of neck, initial encounter: Secondary | ICD-10-CM | POA: Diagnosis present

## 2020-04-15 MED ORDER — CYCLOBENZAPRINE HCL 5 MG PO TABS: 5.0000 mg | ORAL_TABLET | Freq: Three times a day (TID) | ORAL | 0 refills | Status: DC | PRN

## 2020-04-15 MED ORDER — ACETAMINOPHEN 500 MG PO TABS
1000.0000 mg | ORAL_TABLET | Freq: Once | ORAL | Status: AC
  Administered 2020-04-15: 1000 mg via ORAL
  Filled 2020-04-15: qty 2

## 2020-04-15 MED ORDER — CYCLOBENZAPRINE HCL 10 MG PO TABS
10.0000 mg | ORAL_TABLET | Freq: Once | ORAL | Status: AC
  Administered 2020-04-15: 10 mg via ORAL
  Filled 2020-04-15: qty 1
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
## 2020-04-15 NOTE — Discharge Instructions (Addendum)
Please alternate Tylenol and ibuprofen as needed for pain. You may take muscle relaxers as needed for muscle spasms. Apply ice to the cervical muscles 20 minutes every hour for the next couple days then transition over to heat. Try to walk and stay active to prevent muscle tightness and to help loosen muscles of the neck and lower back. Return to the ER for any worsening symptoms or urgent changes in health.

## 2020-05-09 ENCOUNTER — Encounter: Payer: Self-pay | Admitting: Infectious Diseases

## 2020-05-09 ENCOUNTER — Other Ambulatory Visit (HOSPITAL_COMMUNITY)
Admission: RE | Admit: 2020-05-09 | Discharge: 2020-05-09 | Disposition: A | Discharge status: Home / Self Care | Payer: 59 | Source: Ambulatory Visit | Attending: Infectious Diseases | Admitting: Infectious Diseases

## 2020-05-09 ENCOUNTER — Other Ambulatory Visit: Payer: Self-pay

## 2020-05-09 ENCOUNTER — Ambulatory Visit (INDEPENDENT_AMBULATORY_CARE_PROVIDER_SITE_OTHER): Payer: 59 | Admitting: Infectious Diseases

## 2020-05-09 VITALS — BP 126/75 | HR 69 | Temp 98.6°F | Wt 168.8 lb

## 2020-05-09 DIAGNOSIS — Z113 Encounter for screening for infections with a predominantly sexual mode of transmission: Secondary | ICD-10-CM

## 2020-05-09 DIAGNOSIS — Z21 Asymptomatic human immunodeficiency virus [HIV] infection status: Secondary | ICD-10-CM

## 2020-05-09 NOTE — Patient Instructions (Addendum)
Nice to see you today.   Please stop by the lab on your way out. Will send you lab results to your mychart portal once available.   Please continue your DOVATO every day.   CABENUVA is the new medication to treat HIV. It is an injection in each butt cheek every 30 days at this time.   We would start with a pill "lead in" period for you where you take 2 pills once a day with food for 28 days to make sure you tolerate them well before we do the long acting injection.   Largely the side effects were of course related to the injection itself, but during the studies where this was used to treat HIV patients there were only a very small (< 5) number of patients that stopped it due to pain from the shots. Most of the patients in the trials reported overall improvement in the pain associated with the shots over time.   Patient Information Link: https://gskpro.com/content/dam/global/hcpportal/en_US/Prescribing_Information/Cabenuva/pdf/CABENUVA-PI-PIL-IFU2-IFU3.PDF#page=36   If you would like to consider the Cabenuva injections sent me a MyChart message and let me know. We are going to start offering them to patients here this month.   Will have you back in 3 months - if you want to do a video or phone visit you can come for your labs first before the visit. Otherwise we can do same day like today.

## 2020-05-09 NOTE — Assessment & Plan Note (Signed)
Update VL / CD4 today for therapeutic monitoring. He has been undetectable for over a year now. Discussed Cabeunva monthly injections. Information provided for his research. He can receive here if he decides to switch.  RTC in 71m
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
## 2020-05-09 NOTE — Assessment & Plan Note (Signed)
No symptoms today - will screen per request. He has access to condoms

## 2020-05-10 ENCOUNTER — Other Ambulatory Visit: Payer: Self-pay

## 2020-05-10 DIAGNOSIS — A749 Chlamydial infection, unspecified: Secondary | ICD-10-CM

## 2020-05-10 LAB — CYTOLOGY, (ORAL, ANAL, URETHRAL) ANCILLARY ONLY
Chlamydia: NEGATIVE
Chlamydia: POSITIVE — AB
Comment: NEGATIVE
Comment: NEGATIVE
Comment: NORMAL
Comment: NORMAL
Neisseria Gonorrhea: NEGATIVE
Neisseria Gonorrhea: POSITIVE — AB

## 2020-05-10 LAB — URINE CYTOLOGY ANCILLARY ONLY
Chlamydia: NEGATIVE
Comment: NEGATIVE
Comment: NORMAL
Neisseria Gonorrhea: NEGATIVE

## 2020-05-10 LAB — T-HELPER CELL (CD4) - (RCID CLINIC ONLY)
CD4 % Helper T Cell: 33 % (ref 33–65)
CD4 T Cell Abs: 488 /uL (ref 400–1790)

## 2020-05-10 MED ORDER — DOXYCYCLINE HYCLATE 100 MG PO TABS: 100.0000 mg | ORAL_TABLET | Freq: Two times a day (BID) | ORAL | 0 refills | Status: AC

## 2020-05-10 MED FILL — DOVATO 50-300 MG TABS: 50-300 | 30 days supply | Qty: 30 | Fill #1

## 2020-05-10 NOTE — Progress Notes (Signed)
do

## 2020-05-10 NOTE — Progress Notes (Signed)
Patient will need 500 mg IM rocephin x 1 in the office for treatment. Please also send in doxycycline 100 mg BID PO x 7d, #14, 0 refills to his preferred pharmacy to treat the chlamydia.   No sex until he has completed treatment and beyond for an additional week.  Recent partners should be tested / treated at local HD or MD office to avoid re-infection.   Thank you kindly for contacting him!

## 2020-05-11 ENCOUNTER — Ambulatory Visit (INDEPENDENT_AMBULATORY_CARE_PROVIDER_SITE_OTHER): Payer: 59

## 2020-05-11 ENCOUNTER — Other Ambulatory Visit: Payer: Self-pay

## 2020-05-11 DIAGNOSIS — A549 Gonococcal infection, unspecified: Secondary | ICD-10-CM | POA: Diagnosis not present

## 2020-05-11 MED ORDER — CEFTRIAXONE SODIUM 500 MG IJ SOLR
500.0000 mg | Freq: Once | INTRAMUSCULAR | Status: AC
  Administered 2020-05-11: 500 mg via INTRAMUSCULAR

## 2020-05-12 LAB — HIV-1 RNA QUANT-NO REFLEX-BLD
HIV 1 RNA Quant: <20 Copies/mL
HIV-1 RNA Quant, Log: <1.3 Log cps/mL

## 2020-05-12 LAB — RPR: RPR Ser Ql: NONREACTIVE

## 2020-05-29 ENCOUNTER — Ambulatory Visit: Payer: 59 | Admitting: Infectious Diseases

## 2020-06-14 MED FILL — DOVATO 50-300 MG TABS: 50-300 | 30 days supply | Qty: 30 | Fill #2

## 2020-07-11 MED FILL — DOVATO 50-300 MG TABS: 50-300 | 30 days supply | Qty: 30 | Fill #3

## 2020-07-20 ENCOUNTER — Other Ambulatory Visit: Payer: Self-pay | Admitting: *Deleted

## 2020-07-20 DIAGNOSIS — Z113 Encounter for screening for infections with a predominantly sexual mode of transmission: Secondary | ICD-10-CM

## 2020-07-20 DIAGNOSIS — Z79899 Other long term (current) drug therapy: Secondary | ICD-10-CM

## 2020-07-20 DIAGNOSIS — Z21 Asymptomatic human immunodeficiency virus [HIV] infection status: Secondary | ICD-10-CM

## 2020-07-21 ENCOUNTER — Other Ambulatory Visit: Payer: 59

## 2020-07-21 ENCOUNTER — Other Ambulatory Visit (HOSPITAL_COMMUNITY)
Admission: RE | Admit: 2020-07-21 | Discharge: 2020-07-21 | Disposition: A | Discharge status: Home / Self Care | Payer: 59 | Source: Ambulatory Visit | Attending: Infectious Diseases | Admitting: Infectious Diseases

## 2020-07-21 ENCOUNTER — Other Ambulatory Visit: Payer: Self-pay

## 2020-07-21 DIAGNOSIS — Z113 Encounter for screening for infections with a predominantly sexual mode of transmission: Secondary | ICD-10-CM

## 2020-07-21 DIAGNOSIS — Z79899 Other long term (current) drug therapy: Secondary | ICD-10-CM

## 2020-07-21 DIAGNOSIS — Z21 Asymptomatic human immunodeficiency virus [HIV] infection status: Secondary | ICD-10-CM

## 2020-07-21 LAB — T-HELPER CELL (CD4) - (RCID CLINIC ONLY)
CD4 % Helper T Cell: 31 % — ABNORMAL LOW (ref 33–65)
CD4 T Cell Abs: 555 /uL (ref 400–1790)

## 2020-07-24 LAB — URINE CYTOLOGY ANCILLARY ONLY
Chlamydia: NEGATIVE
Comment: NEGATIVE
Comment: NORMAL
Neisseria Gonorrhea: NEGATIVE

## 2020-07-25 LAB — LIPID PANEL
Cholesterol: 157 mg/dL (ref <200)
HDL: 35 mg/dL — ABNORMAL LOW (ref >=40)
LDL Cholesterol (Calc): 102 mg/dL (calc) — ABNORMAL HIGH
Non-HDL Cholesterol (Calc): 122 mg/dL (calc) (ref <130)
Total CHOL/HDL Ratio: 4.5 (calc) (ref <5.0)
Triglycerides: 106 mg/dL (ref <150)

## 2020-07-25 LAB — CBC WITH DIFFERENTIAL/PLATELET
Absolute Monocytes: 468 cells/uL (ref 200–950)
Basophils Absolute: 41 cells/uL (ref 0–200)
Basophils Relative: 0.9 %
Eosinophils Absolute: 216 cells/uL (ref 15–500)
Eosinophils Relative: 4.8 %
HCT: 41.6 % (ref 38.5–50.0)
Hemoglobin: 14 g/dL (ref 13.2–17.1)
Lymphs Abs: 1764 cells/uL (ref 850–3900)
MCH: 27.3 pg (ref 27.0–33.0)
MCHC: 33.7 g/dL (ref 32.0–36.0)
MCV: 81.1 fL (ref 80.0–100.0)
MPV: 10 fL (ref 7.5–12.5)
Monocytes Relative: 10.4 %
Neutro Abs: 2012 cells/uL (ref 1500–7800)
Neutrophils Relative %: 44.7 %
Platelets: 288 10*3/uL (ref 140–400)
RBC: 5.13 10*6/uL (ref 4.20–5.80)
RDW: 14.1 % (ref 11.0–15.0)
Total Lymphocyte: 39.2 %
WBC: 4.5 10*3/uL (ref 3.8–10.8)

## 2020-07-25 LAB — COMPLETE METABOLIC PANEL WITH GFR
AG Ratio: 2.2 (calc) (ref 1.0–2.5)
ALT: 6 U/L — ABNORMAL LOW (ref 9–46)
AST: 10 U/L (ref 10–40)
Albumin: 4.6 g/dL (ref 3.6–5.1)
Alkaline phosphatase (APISO): 63 U/L (ref 36–130)
BUN: 12 mg/dL (ref 7–25)
CO2: 23 mmol/L (ref 20–32)
Calcium: 9.6 mg/dL (ref 8.6–10.3)
Chloride: 106 mmol/L (ref 98–110)
Creat: 1.01 mg/dL (ref 0.60–1.35)
GFR, Est African American: 121 mL/min/{1.73_m2} (ref >=60)
GFR, Est Non African American: 104 mL/min/{1.73_m2} (ref >=60)
Globulin: 2.1 g/dL (calc) (ref 1.9–3.7)
Glucose, Bld: 87 mg/dL (ref 65–99)
Potassium: 4.4 mmol/L (ref 3.5–5.3)
Sodium: 139 mmol/L (ref 135–146)
Total Bilirubin: 0.4 mg/dL (ref 0.2–1.2)
Total Protein: 6.7 g/dL (ref 6.1–8.1)

## 2020-07-25 LAB — RPR: RPR Ser Ql: NONREACTIVE

## 2020-07-25 LAB — HIV-1 RNA QUANT-NO REFLEX-BLD
HIV 1 RNA Quant: <20 Copies/mL
HIV-1 RNA Quant, Log: <1.3 Log cps/mL

## 2020-07-28 ENCOUNTER — Ambulatory Visit: Payer: Self-pay

## 2020-08-03 ENCOUNTER — Encounter: Payer: Self-pay | Admitting: Infectious Diseases

## 2020-08-03 ENCOUNTER — Ambulatory Visit (INDEPENDENT_AMBULATORY_CARE_PROVIDER_SITE_OTHER): Payer: 59 | Admitting: Infectious Diseases

## 2020-08-03 ENCOUNTER — Other Ambulatory Visit: Payer: Self-pay | Admitting: Infectious Diseases

## 2020-08-03 ENCOUNTER — Other Ambulatory Visit (HOSPITAL_COMMUNITY)
Admission: RE | Admit: 2020-08-03 | Discharge: 2020-08-03 | Disposition: A | Discharge status: Home / Self Care | Payer: 59 | Source: Ambulatory Visit | Attending: Infectious Diseases | Admitting: Infectious Diseases

## 2020-08-03 ENCOUNTER — Other Ambulatory Visit: Payer: Self-pay

## 2020-08-03 VITALS — BP 117/70 | HR 72 | Wt 153.0 lb

## 2020-08-03 DIAGNOSIS — Z21 Asymptomatic human immunodeficiency virus [HIV] infection status: Secondary | ICD-10-CM

## 2020-08-03 DIAGNOSIS — Z113 Encounter for screening for infections with a predominantly sexual mode of transmission: Secondary | ICD-10-CM | POA: Diagnosis present

## 2020-08-03 DIAGNOSIS — Z23 Encounter for immunization: Secondary | ICD-10-CM | POA: Diagnosis not present

## 2020-08-03 MED ORDER — DOVATO 50-300 MG PO TABS: 1.0000 | ORAL_TABLET | Freq: Every day | ORAL | 11 refills | Status: DC

## 2020-08-03 NOTE — Assessment & Plan Note (Signed)
Continues to do well with perfectly suppressed viral load. CD4 unchanged and in good range between 400-500. We can re-inquire with his insurance in 6-12 months to see if any changes regarding Cabenuva coverage. Not uncommon being it is new it has not been picked up on commercial payors yet.  Flu shot today. Discussed Pfizer booster - he will consider but politely deferred for now.  STI screening.  RTC in 57m with labs prior to if he wishes or at the visit.

## 2020-08-03 NOTE — Progress Notes (Signed)
Name: Kurt Simpson  DOB: 1996-03-05 MRN: [id number] PCP: Hubbard Hartshorn, FNP    Patient Active Problem List   Diagnosis Date Noted  . Screening for STDs (sexually transmitted diseases) 03/29/2019  . Asymptomatic HIV infection ([place]) 07/30/2018  . Vitamin D deficiency 11/25/2009  . Allergic rhinitis 01/17/2009     Brief Narrative:  Kurt Simpson is a 24 y.o. male with HIV infection diagnosed in October 2019.  History of OIs: none.  HIV Risk: MSM/bisexual.  CD4 nadir 310/VL 263,000.  ATF^T7322 (-), Quantiferon (-). Hep B sAg (-)  Previous Regimens: . Dovato --> suppressed   Genotypes: . 07/2018 - no mutations    Subjective:  CC:  HIV follow up care. No concerns.    HPI:  Kurt Simpson is doing well. Possible exposure to STD - no symptoms but would like screening if possible. Taking dovato every day without any missed doses. He looked into [place] and not currently covered.   Has not had flu shot yet but would like to receive today.     Review of Systems  Constitutional: Negative for chills and fever.  HENT: Negative for tinnitus.   Eyes: Negative for blurred vision and photophobia.  Respiratory: Negative for cough and sputum production.   Cardiovascular: Negative for chest pain.  Gastrointestinal: Negative for diarrhea, nausea and vomiting.  Genitourinary: Negative for dysuria.  Skin: Negative for rash.  Neurological: Negative for headaches.    Past Medical History:  Diagnosis Date  . Allergy   . Enlarged tonsils 04/25/2016  . HIV infection ([place])   . Mononucleosis   . Palpitations   . Vitamin D deficiency     Outpatient Medications Prior to Visit  Medication Sig Dispense Refill  . DOVATO 50-300 MG TABS TAKE 1 TABLET BY MOUTH DAILY. 30 tablet 4  . cyclobenzaprine (FLEXERIL) 5 MG tablet Take 1-2 tablets (5-10 mg total) by mouth 3 (three) times daily as needed for muscle spasms. (Patient not taking: Reported on 08/03/2020) 20 tablet 0   No facility-administered  medications prior to visit.    No Known Allergies  Social History   Tobacco Use  . Smoking status: Never Smoker  . Smokeless tobacco: Never Used  Vaping Use  . Vaping Use: Never used  Substance Use Topics  . Alcohol use: No    Alcohol/week: 0.0 standard drinks    Comment: experimented   . Drug use: No    Social History   Substance and Sexual Activity  Sexual Activity Yes  . Partners: Male  . Birth control/protection: Condom   Comment: Decline condoms 08/03/2020     Objective:   Vitals:   08/03/20 1050  BP: 117/70  Pulse: 72  SpO2: 97%  Weight: 153 lb (69.4 kg)   Body mass index is 22.59 kg/m.  Physical Exam Constitutional:      Appearance: Normal appearance. He is not ill-appearing.  HENT:     Head: Normocephalic.     Mouth/Throat:     Mouth: Mucous membranes are moist.     Pharynx: Oropharynx is clear.  Eyes:     General: No scleral icterus. Pulmonary:     Effort: Pulmonary effort is normal.  Musculoskeletal:        General: Normal range of motion.     Cervical back: Normal range of motion.  Skin:    Coloration: Skin is not jaundiced or pale.  Neurological:     Mental Status: He is alert and oriented to person, place, and time.  Psychiatric:  Mood and Affect: Mood normal.        Judgment: Judgment normal.    Lab Results Lab Results  Component Value Date   WBC 4.5 07/21/2020   HGB 14.0 07/21/2020   HCT 41.6 07/21/2020   MCV 81.1 07/21/2020   PLT 288 07/21/2020    Lab Results  Component Value Date   CREATININE 1.01 07/21/2020   BUN 12 07/21/2020   NA 139 07/21/2020   K 4.4 07/21/2020   CL 106 07/21/2020   CO2 23 07/21/2020    Lab Results  Component Value Date   ALT 6 (L) 07/21/2020   AST 10 07/21/2020   ALKPHOS 67 05/10/2018   BILITOT 0.4 07/21/2020    Lab Results  Component Value Date   CHOL 157 07/21/2020   HDL 35 (L) 07/21/2020   LDLCALC 102 (H) 07/21/2020   TRIG 106 07/21/2020   CHOLHDL 4.5 07/21/2020   HIV 1  RNA Quant  Date Value  07/21/2020 <20 Copies/mL  05/09/2020 <20 Copies/mL  01/03/2020 <20 NOT DETECTED copies/mL   CD4 T Cell Abs (/uL)  Date Value  07/21/2020 555  05/09/2020 488  01/03/2020 543     Assessment & Plan:   Problem List Items Addressed This Visit      Unprioritized   Screening for STDs (sexually transmitted diseases) - Primary   Relevant Orders   Cytology (oral, anal, urethral) ancillary only   Cytology (oral, anal, urethral) ancillary only   Asymptomatic HIV infection ([place]) (Chronic)    Continues to do well with perfectly suppressed viral load. CD4 unchanged and in good range between 400-500. We can re-inquire with his insurance in 6-12 months to see if any changes regarding Cabenuva coverage. Not uncommon being it is new it has not been picked up on commercial payors yet.  Flu shot today. Discussed Pfizer booster - he will consider but politely deferred for now.  STI screening.  RTC in 70mwith labs prior to if he wishes or at the visit.       Relevant Medications   Dolutegravir-lamiVUDine (DOVATO) 50-300 MG TABS      SJanene Madeira MSN, NP-C [facility] [email or web address] Pager: [phone]: [phone]  08/03/20  11:08 AM

## 2020-08-03 NOTE — Patient Instructions (Signed)
Nice to see you!   Please continue Dovato every day  - we can try to look into Cabenuva again in 6-12 months with your insurance to see if anything has changed with regards to them covering it.   Will notify you of lab results from today's tests.   Flu shot today.   If you would like to get your booster of the pfizer vaccine you would be eligible 79months after your last dose.   Follow up again in 4 months. We can do labs prior to your visit or at the visit - it's up to you.

## 2020-08-04 ENCOUNTER — Telehealth: Payer: Self-pay

## 2020-08-04 ENCOUNTER — Other Ambulatory Visit: Payer: Self-pay | Admitting: Infectious Diseases

## 2020-08-04 LAB — CYTOLOGY, (ORAL, ANAL, URETHRAL) ANCILLARY ONLY
Chlamydia: NEGATIVE
Chlamydia: POSITIVE — AB
Comment: NEGATIVE
Comment: NEGATIVE
Comment: NORMAL
Comment: NORMAL
Neisseria Gonorrhea: NEGATIVE
Neisseria Gonorrhea: POSITIVE — AB

## 2020-08-04 MED ORDER — DOXYCYCLINE HYCLATE 100 MG PO TABS: 100.0000 mg | ORAL_TABLET | Freq: Two times a day (BID) | ORAL | 0 refills | Status: DC

## 2020-08-04 MED FILL — DOXYCYCLINE HYCLATE 100 MG: 100 | 7 days supply | Qty: 14 | Fill #0

## 2020-08-04 NOTE — Telephone Encounter (Signed)
Patient made aware of results and verbalized understanding of Stephanie's previous MyChart message. Patient accepts appointment for gonorrhea treatment on 08/07/20 Valarie Cones

## 2020-08-04 NOTE — Telephone Encounter (Signed)
-----   Message from Blanchard Kelch, NP sent at 08/04/2020  3:10 PM EDT ----- Please call Kurt Simpson to have him come for 500 mg IM ceftriaxone for gonorrhea. I have sent in doxycycline for the chlamydia he should pick up and start taking.   Thank you,  Judeth Cornfield

## 2020-08-04 NOTE — Addendum Note (Signed)
Addended by: Blanchard Kelch on: 08/04/2020 03:10 PM   Modules accepted: Orders

## 2020-08-07 ENCOUNTER — Ambulatory Visit (INDEPENDENT_AMBULATORY_CARE_PROVIDER_SITE_OTHER): Payer: 59 | Admitting: *Deleted

## 2020-08-07 ENCOUNTER — Other Ambulatory Visit: Payer: Self-pay

## 2020-08-07 DIAGNOSIS — A549 Gonococcal infection, unspecified: Secondary | ICD-10-CM

## 2020-08-07 MED ORDER — CEFTRIAXONE SODIUM 500 MG IJ SOLR
500.0000 mg | Freq: Once | INTRAMUSCULAR | Status: AC
  Administered 2020-08-07: 500 mg via INTRAMUSCULAR

## 2020-08-07 NOTE — Progress Notes (Signed)
RN offered condoms, advised patient to remain abstinent for 7-10 days after treatment. Patient's questions answered to their satisfaction. Andree Coss, RN

## 2020-08-08 ENCOUNTER — Other Ambulatory Visit: Payer: 59

## 2020-08-09 MED FILL — DOVATO 50-300 MG TABS: 50-300 | 30 days supply | Qty: 30 | Fill #0

## 2020-08-21 ENCOUNTER — Encounter: Payer: 59 | Admitting: Infectious Diseases

## 2020-09-06 MED FILL — DOVATO 50-300 MG TABS: 50-300 | 30 days supply | Qty: 30 | Fill #1

## 2020-09-16 MED FILL — DOVATO 50-300 MG TABS: 50-300 | 30 days supply | Qty: 30 | Fill #1

## 2020-10-11 MED FILL — DOVATO 50-300 MG TABS: 50-300 | 30 days supply | Qty: 30 | Fill #2

## 2020-11-07 MED FILL — DOVATO 50-300 MG TABS: 50-300 | 30 days supply | Qty: 30 | Fill #3

## 2020-11-23 ENCOUNTER — Other Ambulatory Visit: Payer: Self-pay

## 2020-11-23 DIAGNOSIS — Z21 Asymptomatic human immunodeficiency virus [HIV] infection status: Secondary | ICD-10-CM

## 2020-11-27 ENCOUNTER — Other Ambulatory Visit: Payer: 59

## 2020-11-27 ENCOUNTER — Other Ambulatory Visit: Payer: Self-pay

## 2020-11-27 DIAGNOSIS — Z21 Asymptomatic human immunodeficiency virus [HIV] infection status: Secondary | ICD-10-CM

## 2020-11-28 LAB — T-HELPER CELL (CD4) - (RCID CLINIC ONLY)
CD4 % Helper T Cell: 29 % — ABNORMAL LOW (ref 33–65)
CD4 T Cell Abs: 415 /uL (ref 400–1790)

## 2020-11-29 LAB — CBC WITH DIFFERENTIAL/PLATELET
Absolute Monocytes: 356 cells/uL (ref 200–950)
Basophils Absolute: 41 cells/uL (ref 0–200)
Basophils Relative: 0.9 %
Eosinophils Absolute: 113 cells/uL (ref 15–500)
Eosinophils Relative: 2.5 %
HCT: 44 % (ref 38.5–50.0)
Hemoglobin: 14.6 g/dL (ref 13.2–17.1)
Lymphs Abs: 1436 cells/uL (ref 850–3900)
MCH: 27.3 pg (ref 27.0–33.0)
MCHC: 33.2 g/dL (ref 32.0–36.0)
MCV: 82.2 fL (ref 80.0–100.0)
MPV: 9.9 fL (ref 7.5–12.5)
Monocytes Relative: 7.9 %
Neutro Abs: 2556 cells/uL (ref 1500–7800)
Neutrophils Relative %: 56.8 %
Platelets: 299 10*3/uL (ref 140–400)
RBC: 5.35 10*6/uL (ref 4.20–5.80)
RDW: 14.4 % (ref 11.0–15.0)
Total Lymphocyte: 31.9 %
WBC: 4.5 10*3/uL (ref 3.8–10.8)

## 2020-11-29 LAB — COMPLETE METABOLIC PANEL WITH GFR
AG Ratio: 2.4 (calc) (ref 1.0–2.5)
ALT: 7 U/L — ABNORMAL LOW (ref 9–46)
AST: 9 U/L — ABNORMAL LOW (ref 10–40)
Albumin: 4.8 g/dL (ref 3.6–5.1)
Alkaline phosphatase (APISO): 59 U/L (ref 36–130)
BUN: 9 mg/dL (ref 7–25)
CO2: 28 mmol/L (ref 20–32)
Calcium: 9.8 mg/dL (ref 8.6–10.3)
Chloride: 107 mmol/L (ref 98–110)
Creat: 1.06 mg/dL (ref 0.60–1.35)
GFR, Est African American: 114 mL/min/{1.73_m2} (ref >=60)
GFR, Est Non African American: 98 mL/min/{1.73_m2} (ref >=60)
Globulin: 2 g/dL (calc) (ref 1.9–3.7)
Glucose, Bld: 93 mg/dL (ref 65–99)
Potassium: 4.6 mmol/L (ref 3.5–5.3)
Sodium: 141 mmol/L (ref 135–146)
Total Bilirubin: 0.4 mg/dL (ref 0.2–1.2)
Total Protein: 6.8 g/dL (ref 6.1–8.1)

## 2020-11-29 LAB — HIV-1 RNA QUANT-NO REFLEX-BLD
HIV 1 RNA Quant: 45 Copies/mL — ABNORMAL HIGH
HIV-1 RNA Quant, Log: 1.66 Log cps/mL — ABNORMAL HIGH

## 2020-12-08 MED FILL — DOVATO 50-300 MG TABS: 50-300 | 30 days supply | Qty: 30 | Fill #4

## 2020-12-12 ENCOUNTER — Other Ambulatory Visit: Payer: Self-pay

## 2020-12-12 ENCOUNTER — Encounter: Payer: Self-pay | Admitting: Infectious Diseases

## 2020-12-12 ENCOUNTER — Ambulatory Visit (INDEPENDENT_AMBULATORY_CARE_PROVIDER_SITE_OTHER): Payer: 59 | Admitting: Infectious Diseases

## 2020-12-12 ENCOUNTER — Other Ambulatory Visit (HOSPITAL_COMMUNITY)
Admission: RE | Admit: 2020-12-12 | Discharge: 2020-12-12 | Disposition: A | Discharge status: Home / Self Care | Payer: 59 | Source: Ambulatory Visit | Attending: Infectious Diseases | Admitting: Infectious Diseases

## 2020-12-12 VITALS — BP 106/62 | HR 69 | Temp 98.0°F | Ht 69.0 in | Wt 138.0 lb

## 2020-12-12 DIAGNOSIS — Z113 Encounter for screening for infections with a predominantly sexual mode of transmission: Secondary | ICD-10-CM | POA: Insufficient documentation

## 2020-12-12 DIAGNOSIS — Z21 Asymptomatic human immunodeficiency virus [HIV] infection status: Secondary | ICD-10-CM

## 2020-12-12 NOTE — Assessment & Plan Note (Signed)
No symptoms present today - will screen with 3-point testing. RPR next lab draw (negative about 4 months ago).

## 2020-12-12 NOTE — Progress Notes (Signed)
Name: Amit Leece  DOB: January 04, 1996 MRN: [id number] PCP: Hubbard Hartshorn, FNP    Brief Narrative:  Kurt Simpson is a 25 y.o. male with HIV infection diagnosed in October 2019.  History of OIs: none.  HIV Risk: MSM/bisexual.  CD4 nadir 310/VL 263,000.  XBL^T9030 (-), Quantiferon (-). Hep B sAg (-)  Previous Regimens: . Dovato --> suppressed   Genotypes: . 07/2018 - no mutations    Subjective:  CC:  HIV follow up care. No concerns.    HPI:  Kurt Simpson is doing well today. No concerns to discuss. Still taking Dovato everyday without lapse in doses. No new medications or vitamins. Eating and sleeping well. Started a new more convenient shift at work that he is happy about. Requesting STI screening today - no symptoms presently.   Flu shot up to date. Considering COVID booster but has not gotten it yet. Wearing [id number] out and about and at work consistently. Has avoided COVID infection thusfar.     Review of Systems  Constitutional: Negative for chills and fever.  HENT: Negative for sore throat.   Eyes: Negative for visual disturbance.  Gastrointestinal: Negative for abdominal pain, anal bleeding and rectal pain.  Genitourinary: Negative for dysuria, genital sores, penile discharge, penile pain, scrotal swelling and testicular pain.  Musculoskeletal: Negative for arthralgias and joint swelling.  Skin: Negative for rash.  Neurological: Negative for headaches.  Hematological: Negative for adenopathy.     Past Medical History:  Diagnosis Date  . Allergy   . Enlarged tonsils 04/25/2016  . HIV infection ([place])   . Mononucleosis   . Palpitations   . Vitamin D deficiency     Outpatient Medications Prior to Visit  Medication Sig Dispense Refill  . Dolutegravir-lamiVUDine (DOVATO) 50-300 MG TABS Take 1 tablet by mouth daily. 30 tablet 11  . cyclobenzaprine (FLEXERIL) 5 MG tablet Take 1-2 tablets (5-10 mg total) by mouth 3 (three) times daily as needed for muscle spasms. (Patient  not taking: Reported on 12/12/2020) 20 tablet 0   No facility-administered medications prior to visit.    No Known Allergies  Social History   Tobacco Use  . Smoking status: Never Smoker  . Smokeless tobacco: Never Used  Vaping Use  . Vaping Use: Never used  Substance Use Topics  . Alcohol use: No    Alcohol/week: 0.0 standard drinks    Comment: experimented   . Drug use: No    Social History   Substance and Sexual Activity  Sexual Activity Yes  . Partners: Male  . Birth control/protection: Condom   Comment: Decline condoms 08/03/2020     Objective:   Vitals:   12/12/20 1027  BP: 106/62  Pulse: 69  Temp: 98 F (36.7 C)  Weight: 138 lb (62.6 kg)  Height: 5' 9" (1.753 m)   Body mass index is 20.38 kg/m.  Physical Exam Constitutional:      Appearance: Normal appearance. He is not ill-appearing.  HENT:     Head: Normocephalic.     Mouth/Throat:     Mouth: Mucous membranes are moist.     Pharynx: Oropharynx is clear.  Eyes:     General: No scleral icterus. Pulmonary:     Effort: Pulmonary effort is normal.  Musculoskeletal:        General: Normal range of motion.     Cervical back: Normal range of motion.  Skin:    Coloration: Skin is not jaundiced or pale.  Neurological:     Mental Status: He  is alert and oriented to person, place, and time.  Psychiatric:        Mood and Affect: Mood normal.        Judgment: Judgment normal.      Lab Results Lab Results  Component Value Date   WBC 4.5 11/27/2020   HGB 14.6 11/27/2020   HCT 44.0 11/27/2020   MCV 82.2 11/27/2020   PLT 299 11/27/2020    Lab Results  Component Value Date   CREATININE 1.06 11/27/2020   BUN 9 11/27/2020   NA 141 11/27/2020   K 4.6 11/27/2020   CL 107 11/27/2020   CO2 28 11/27/2020    Lab Results  Component Value Date   ALT 7 (L) 11/27/2020   AST 9 (L) 11/27/2020   ALKPHOS 67 05/10/2018   BILITOT 0.4 11/27/2020    Lab Results  Component Value Date   CHOL 157  07/21/2020   HDL 35 (L) 07/21/2020   LDLCALC 102 (H) 07/21/2020   TRIG 106 07/21/2020   CHOLHDL 4.5 07/21/2020   HIV 1 RNA Quant (Copies/mL)  Date Value  11/27/2020 45 (H)  07/21/2020 <20  05/09/2020 <20   CD4 T Cell Abs (/uL)  Date Value  11/27/2020 415  07/21/2020 555  05/09/2020 488     Assessment & Plan:   Problem List Items Addressed This Visit      Unprioritized   Screening for STDs (sexually transmitted diseases) - Primary    No symptoms present today - will screen with 3-point testing. RPR next lab draw (negative about 4 months ago).       Relevant Orders   Cytology (oral, anal, urethral) ancillary only   Urine cytology ancillary only   Cytology (oral, anal, urethral) ancillary only   Asymptomatic HIV infection ([facility]) (Chronic)    Doing very well on Dovato once daily. He may be interested in Cabenuva - we discussed the Q2m indication that was approved recently. Will inquire with his insurance in 4-6 months to see if on formulary at that time if that is an accessible treatment option for him.  STI screen today.  Recommended COVID booster at anytime if he would like to receive.  Return in about 4 months (around 04/13/2021). with labs same day          Stephanie Dixon, MSN, NP-C [facility]  [email or web address] Pager: [phone] Office: [phone] [facility] Main Line: [phone]   12/12/20  11:06 AM   

## 2020-12-12 NOTE — Assessment & Plan Note (Signed)
Doing very well on Dovato once daily. He may be interested in [place] - we discussed the Q39m indication that was approved recently. Will inquire with his insurance in 4-6 months to see if on formulary at that time if that is an accessible treatment option for him.  STI screen today.  Recommended COVID booster at anytime if he would like to receive.  Return in about 4 months (around 04/13/2021). with labs same day

## 2020-12-12 NOTE — Patient Instructions (Signed)
Nice to see you again today -   If you are interested in the cabenuva injection treatments we can look into this with your insurance at your next office visit. Hopefully it will be something available to you then.   Will let you know what your test results show when they come back and if any treatment is needed.   See you back in 4 months - we can do labs at the visit for you same day.

## 2020-12-13 LAB — CYTOLOGY, (ORAL, ANAL, URETHRAL) ANCILLARY ONLY
Chlamydia: NEGATIVE
Chlamydia: NEGATIVE
Comment: NEGATIVE
Comment: NEGATIVE
Comment: NORMAL
Comment: NORMAL
Neisseria Gonorrhea: NEGATIVE
Neisseria Gonorrhea: NEGATIVE

## 2020-12-13 LAB — URINE CYTOLOGY ANCILLARY ONLY
Chlamydia: NEGATIVE
Comment: NEGATIVE
Comment: NORMAL
Neisseria Gonorrhea: NEGATIVE

## 2021-01-04 ENCOUNTER — Encounter: Payer: 59 | Admitting: Family Medicine

## 2021-01-04 ENCOUNTER — Other Ambulatory Visit (HOSPITAL_COMMUNITY): Payer: Self-pay

## 2021-01-07 ENCOUNTER — Other Ambulatory Visit (HOSPITAL_COMMUNITY): Payer: Self-pay

## 2021-01-07 MED FILL — Dolutegravir Sodium-Lamivudine Tab 50-300 MG (Base Eq): ORAL | 30 days supply | Qty: 30 | Fill #0 | Status: AC

## 2021-01-09 ENCOUNTER — Other Ambulatory Visit (HOSPITAL_COMMUNITY): Payer: Self-pay

## 2021-01-10 ENCOUNTER — Other Ambulatory Visit (HOSPITAL_COMMUNITY): Payer: Self-pay

## 2021-01-25 ENCOUNTER — Ambulatory Visit: Payer: 59 | Admitting: Family Medicine

## 2021-02-01 ENCOUNTER — Other Ambulatory Visit (HOSPITAL_COMMUNITY): Payer: Self-pay

## 2021-02-05 ENCOUNTER — Other Ambulatory Visit (HOSPITAL_COMMUNITY): Payer: Self-pay

## 2021-02-07 ENCOUNTER — Other Ambulatory Visit (HOSPITAL_COMMUNITY): Payer: Self-pay

## 2021-02-07 MED FILL — Dolutegravir Sodium-Lamivudine Tab 50-300 MG (Base Eq): ORAL | 30 days supply | Qty: 30 | Fill #1 | Status: AC

## 2021-03-01 ENCOUNTER — Other Ambulatory Visit (HOSPITAL_COMMUNITY): Payer: Self-pay

## 2021-03-01 MED FILL — Dolutegravir Sodium-Lamivudine Tab 50-300 MG (Base Eq): ORAL | 30 days supply | Qty: 30 | Fill #2 | Status: AC

## 2021-03-07 ENCOUNTER — Other Ambulatory Visit (HOSPITAL_COMMUNITY): Payer: Self-pay
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
## 2021-03-12 ENCOUNTER — Other Ambulatory Visit (HOSPITAL_COMMUNITY): Payer: Self-pay

## 2021-03-12 ENCOUNTER — Telehealth: Payer: Self-pay

## 2021-03-12 ENCOUNTER — Ambulatory Visit (INDEPENDENT_AMBULATORY_CARE_PROVIDER_SITE_OTHER): Payer: 59 | Admitting: Infectious Diseases

## 2021-03-12 ENCOUNTER — Other Ambulatory Visit: Payer: Self-pay

## 2021-03-12 ENCOUNTER — Other Ambulatory Visit (HOSPITAL_COMMUNITY)
Admission: RE | Admit: 2021-03-12 | Discharge: 2021-03-12 | Disposition: A | Discharge status: Home / Self Care | Payer: 59 | Source: Ambulatory Visit | Attending: Infectious Diseases | Admitting: Infectious Diseases

## 2021-03-12 ENCOUNTER — Encounter: Payer: Self-pay | Admitting: Infectious Diseases

## 2021-03-12 VITALS — BP 111/63 | HR 83 | Temp 98.2°F | Wt 138.0 lb

## 2021-03-12 DIAGNOSIS — Z21 Asymptomatic human immunodeficiency virus [HIV] infection status: Secondary | ICD-10-CM | POA: Diagnosis not present

## 2021-03-12 DIAGNOSIS — Z113 Encounter for screening for infections with a predominantly sexual mode of transmission: Secondary | ICD-10-CM | POA: Diagnosis not present

## 2021-03-12 NOTE — Patient Instructions (Addendum)
Nice to see you today.   Keep taking your dovato for now and we will investigate the ability to do Cabenuva injections for you.  Please stop by the lab on your way out.  Collection on your MyChart once results come in if you need any further treatment.  Return in about 4 months (around 07/12/2021).

## 2021-03-12 NOTE — Assessment & Plan Note (Signed)
Kurt Simpson has been doing very well on once daily Dovato and is taking this correctly with good therapeutic effect.  Last viral load undetectable and healthy reconstituted's immune system. We talked a bit of today about transitioning to Cabenuva injections for ongoing treatment.  He is interested in the every 2 month schedule.  We discussed that there was a small risk of failure to the medication seen in the trial in the first 6 months of therapy.  We would commit to monitoring his viral loads every 2 months after the loading dose has been given for the first 6 months to ensure he has good treatment response.  Will submit approval for insurance to see what options are. FU in 4 months or sooner pending ability to switch treatment.

## 2021-03-12 NOTE — Telephone Encounter (Signed)
[facility] Patient Advocate Encounter   Was successful in obtaining a [employer] copay card for Cabenuva.  This copay card will make the patients copay 0.00.  I have spoken with the patient.    The billing information is as follows and has been shared with [facility].       Serina Nichter, CPhT [facility] Patient Advocate [facility] Phone: [phone] Fax:  [phone]  

## 2021-03-12 NOTE — Progress Notes (Addendum)
Name: Kurt Simpson  DOB: [birth year] MRN: [id number] PCP: Hubbard Hartshorn, FNP    Brief Narrative:  Kurt Simpson is a 25 y.o. male with HIV infection diagnosed in October 2019.  History of OIs: none.  HIV Risk: MSM/bisexual.  CD4 nadir 310/VL 263,000.  VOJ^[id number] (-), Quantiferon (-). Hep B sAg (-)  Previous Regimens: . Dovato --> suppressed   Genotypes: . 07/2018 - no mutations    Subjective:  CC:  HIV follow up care. No concerns.    HPI:  Kurt Simpson is doing well - no concerns. Would like routine STI screening today if possible. No symptoms to declare or known exposures.  Continues to take his dovato every day without any lapse in medication. No concern over access or intolerance to this. He is very interested in moving forward with Cabenuva injections and wants to see if we can arrange this switch.   No concern over anxious/depressed mood. Sleeping and eating well.     Review of Systems  Constitutional: Negative for chills and fever.  HENT: Negative for sore throat.   Eyes: Negative for visual disturbance.  Gastrointestinal: Negative for abdominal pain, anal bleeding and rectal pain.  Genitourinary: Negative for dysuria, genital sores, penile discharge, penile pain, scrotal swelling and testicular pain.  Musculoskeletal: Negative for arthralgias and joint swelling.  Skin: Negative for rash.  Neurological: Negative for headaches.  Hematological: Negative for adenopathy.     Past Medical History:  Diagnosis Date  . Allergy   . Enlarged tonsils 04/25/2016  . HIV infection ([place])   . Mononucleosis   . Palpitations   . Vitamin D deficiency     Outpatient Medications Prior to Visit  Medication Sig Dispense Refill  . Dolutegravir-lamiVUDine 50-300 MG TABS TAKE 1 TABLET BY MOUTH DAILY. 30 tablet 11  . cyclobenzaprine (FLEXERIL) 5 MG tablet Take 1-2 tablets (5-10 mg total) by mouth 3 (three) times daily as needed for muscle spasms. (Patient not taking: Reported on  03/12/2021) 20 tablet 0  . doxycycline (VIBRA-TABS) 100 MG tablet TAKE 1 TABLET BY MOUTH TWICE DAILY WITH MEALS FOR 7 DAYS (Patient not taking: Reported on 03/12/2021) 14 tablet 0   No facility-administered medications prior to visit.    No Known Allergies  Social History   Tobacco Use  . Smoking status: Never Smoker  . Smokeless tobacco: Never Used  Vaping Use  . Vaping Use: Never used  Substance Use Topics  . Alcohol use: Yes    Alcohol/week: 0.0 standard drinks    Comment: occ  . Drug use: No    Social History   Substance and Sexual Activity  Sexual Activity Yes  . Partners: Male  . Birth control/protection: Condom   Comment: Decline condoms 03/2021     Objective:   Vitals:   03/12/21 1019  BP: 111/63  Pulse: 83  Temp: 98.2 F (36.8 C)  TempSrc: Oral  Weight: 138 lb (62.6 kg)   Body mass index is 20.38 kg/m.  Physical Exam Constitutional:      Appearance: Normal appearance. He is not ill-appearing.  HENT:     Head: Normocephalic.     Mouth/Throat:     Mouth: Mucous membranes are moist.     Pharynx: Oropharynx is clear.  Eyes:     General: No scleral icterus. Pulmonary:     Effort: Pulmonary effort is normal.  Musculoskeletal:        General: Normal range of motion.     Cervical back: Normal range of  motion.  Skin:    Coloration: Skin is not jaundiced or pale.  Neurological:     Mental Status: He is alert and oriented to person, place, and time.  Psychiatric:        Mood and Affect: Mood normal.        Judgment: Judgment normal.      Lab Results Lab Results  Component Value Date   WBC 4.5 11/27/2020   HGB 14.6 11/27/2020   HCT 44.0 11/27/2020   MCV 82.2 11/27/2020   PLT 299 11/27/2020    Lab Results  Component Value Date   CREATININE 1.06 11/27/2020   BUN 9 11/27/2020   NA 141 11/27/2020   K 4.6 11/27/2020   CL 107 11/27/2020   CO2 28 11/27/2020    Lab Results  Component Value Date   ALT 7 (L) 11/27/2020   AST 9 (L) 11/27/2020    ALKPHOS 67 05/10/2018   BILITOT 0.4 11/27/2020    Lab Results  Component Value Date   CHOL 157 07/21/2020   HDL 35 (L) 07/21/2020   LDLCALC 102 (H) 07/21/2020   TRIG 106 07/21/2020   CHOLHDL 4.5 07/21/2020   HIV 1 RNA Quant (Copies/mL)  Date Value  11/27/2020 45 (H)  07/21/2020 <20  05/09/2020 <20   CD4 T Cell Abs (/uL)  Date Value  11/27/2020 415  07/21/2020 555  05/09/2020 488     Assessment & Plan:   Problem List Items Addressed This Visit      Unprioritized   Screening for STDs (sexually transmitted diseases)    Screening today based on sexual assessment with urine, oropharyngeal and rectal sites. Serum RPR.  No empiric treatment necessary.       Asymptomatic HIV infection ([place]) - Primary (Chronic)    Kurt Simpson has been doing very well on once daily Dovato and is taking this correctly with good therapeutic effect.  Last viral load undetectable and healthy reconstituted's immune system. We talked a bit of today about transitioning to Cabenuva injections for ongoing treatment.  He is interested in the every 2 month schedule.  We discussed that there was a small risk of failure to the medication seen in the trial in the first 6 months of therapy.  We would commit to monitoring his viral loads every 2 months after the loading dose has been given for the first 6 months to ensure he has good treatment response.  Will submit approval for insurance to see what options are. FU in 4 months or sooner pending ability to switch treatment.       Relevant Orders   HIV-1 RNA quant-no reflex-bld   T-helper cell (CD4)- ([facility] only)    Other Visit Diagnoses    Routine screening for STI (sexually transmitted infection)       Relevant Orders   RPR   Urine cytology ancillary only   Cytology (oral, anal, urethral) ancillary only   Cytology (oral, anal, urethral) ancillary only      Janene Madeira, MSN, NP-C [facility] [place] [facility] [email or web address] Pager: [phone] Office: [phone] [place]: [phone]   03/12/21  12:29 PM

## 2021-03-12 NOTE — Addendum Note (Signed)
Addended by: Blanchard Kelch on: 03/12/2021 12:29 PM   Modules accepted: Orders

## 2021-03-12 NOTE — Assessment & Plan Note (Signed)
Screening today based on sexual assessment with urine, oropharyngeal and rectal sites. Serum RPR.  No empiric treatment necessary.

## 2021-03-13 LAB — CYTOLOGY, (ORAL, ANAL, URETHRAL) ANCILLARY ONLY
Chlamydia: NEGATIVE
Chlamydia: NEGATIVE
Comment: NEGATIVE
Comment: NEGATIVE
Comment: NORMAL
Comment: NORMAL
Neisseria Gonorrhea: NEGATIVE
Neisseria Gonorrhea: NEGATIVE

## 2021-03-13 LAB — URINE CYTOLOGY ANCILLARY ONLY
Chlamydia: NEGATIVE
Comment: NEGATIVE
Comment: NORMAL
Neisseria Gonorrhea: NEGATIVE

## 2021-03-14 ENCOUNTER — Other Ambulatory Visit (HOSPITAL_COMMUNITY): Payer: Self-pay

## 2021-03-14 LAB — RPR: RPR Ser Ql: NONREACTIVE

## 2021-03-14 LAB — HIV-1 RNA QUANT-NO REFLEX-BLD
HIV 1 RNA Quant: 22 Copies/mL — ABNORMAL HIGH
HIV-1 RNA Quant, Log: 1.35 Log cps/mL — ABNORMAL HIGH

## 2021-03-14 NOTE — Telephone Encounter (Signed)
So with Cherlyn Labella is not covered with his [employer] it is covered but he have a high copay that insurance is not really paying anything on it. With his [place] they cover the medication. I also spoke with Clifton Custard.He wanted to make sure his [employer] paid for the dovato & it so pay. @ nocharge

## 2021-03-14 NOTE — Telephone Encounter (Signed)
Kurt Simpson , [name] is covered I will get him a copay card and put it in his epic notes. Script can be filled at [facility]

## 2021-03-30 ENCOUNTER — Other Ambulatory Visit (HOSPITAL_COMMUNITY): Payer: Self-pay

## 2021-04-04 ENCOUNTER — Other Ambulatory Visit (HOSPITAL_COMMUNITY): Payer: Self-pay

## 2021-04-04 MED FILL — Dolutegravir Sodium-Lamivudine Tab 50-300 MG (Base Eq): ORAL | 30 days supply | Qty: 30 | Fill #3 | Status: AC

## 2021-04-11 ENCOUNTER — Other Ambulatory Visit (HOSPITAL_COMMUNITY): Payer: Self-pay

## 2021-04-13 ENCOUNTER — Ambulatory Visit: Payer: 59 | Admitting: Infectious Diseases

## 2021-05-01 NOTE — Patient Instructions (Signed)
Preventive Care 21-24 Years Old, Male Preventive care refers to lifestyle choices and visits with your health care provider that can promote health and wellness. This includes: A yearly physical exam. This is also called an annual wellness visit. Regular dental and eye exams. Immunizations. Screening for certain conditions. Healthy lifestyle choices, such as: Eating a healthy diet. Getting regular exercise. Not using drugs or products that contain nicotine and tobacco. Limiting alcohol use. What can I expect for my preventive care visit? Physical exam Your health care provider may check your: Height and weight. These may be used to calculate your BMI (body mass index). BMI is a measurement that tells if you are at a healthy weight. Heart rate and blood pressure. Body temperature. Skin for abnormal spots. Counseling Your health care provider may ask you questions about your: Past medical problems. Family's medical history. Alcohol, tobacco, and drug use. Emotional well-being. Home life and relationship well-being. Sexual activity. Diet, exercise, and sleep habits. Work and work environment. Access to firearms. What immunizations do I need?  Vaccines are usually given at various ages, according to a schedule. Your health care provider will recommend vaccines for you based on your age, medicalhistory, and lifestyle or other factors, such as travel or where you work. What tests do I need? Blood tests Lipid and cholesterol levels. These may be checked every 5 years starting at age 20. Hepatitis C test. Hepatitis B test. Screening  Diabetes screening. This is done by checking your blood sugar (glucose) after you have not eaten for a while (fasting). Genital exam to check for testicular cancer or hernias. STD (sexually transmitted disease) testing, if you are at risk. Talk with your health care provider about your test results, treatment options,and if necessary, the need for more  tests. Follow these instructions at home: Eating and drinking  Eat a healthy diet that includes fresh fruits and vegetables, whole grains, lean protein, and low-fat dairy products. Drink enough fluid to keep your urine pale yellow. Take vitamin and mineral supplements as recommended by your health care provider. Do not drink alcohol if your health care provider tells you not to drink. If you drink alcohol: Limit how much you have to 0-2 drinks a day. Be aware of how much alcohol is in your drink. In the [place] one drink equals one 12 oz bottle of beer (355 mL), one 5 oz glass of wine (148 mL), or one 1 oz glass of hard liquor (44 mL).  Lifestyle Take daily care of your teeth and gums. Brush your teeth every morning and night with fluoride toothpaste. Floss one time each day. Stay active. Exercise for at least 30 minutes 5 or more days each week. Do not use any products that contain nicotine or tobacco, such as cigarettes, e-cigarettes, and chewing tobacco. If you need help quitting, ask your health care provider. Do not use drugs. If you are sexually active, practice safe sex. Use a condom or other form of protection to prevent STIs (sexually transmitted infections). Find healthy ways to cope with stress, such as: Meditation, yoga, or listening to music. Journaling. Talking to a trusted person. Spending time with friends and family. Safety Always wear your seat belt while driving or riding in a vehicle. Do not drive: If you have been drinking alcohol. Do not ride with someone who has been drinking. When you are tired or distracted. While texting. Wear a helmet and other protective equipment during sports activities. If you have firearms in your house, make sure   you follow all gun safety procedures. Seek help if you have been physically or sexually abused. What's next? Go to your health care provider once a year for an annual wellness visit. Ask your health care provider how often  you should have your eyes and teeth checked. Stay up to date on all vaccines. This information is not intended to replace advice given to you by your health care provider. Make sure you discuss any questions you have with your healthcare provider. Document Revised: 06/09/2019 Document Reviewed: 09/17/2018 Elsevier Patient Education  2022 Elsevier Inc.  

## 2021-05-01 NOTE — Progress Notes (Signed)
Name: Kurt Simpson   MRN: [id number]    DOB: [birth year]   Date:05/02/2021       Progress Note  Subjective  Chief Complaint  Annual Exam  HPI  Patient presents for annual CPE .  IPSS Questionnaire (AUA-7): Over the past month.   1)  How often have you had a sensation of not emptying your bladder completely after you finish urinating?  0 - Not at all  2)  How often have you had to urinate again less than two hours after you finished urinating? 0 - Not at all  3)  How often have you found you stopped and started again several times when you urinated?  0 - Not at all  4) How difficult have you found it to postpone urination?  0 - Not at all  5) How often have you had a weak urinary stream?  0 - Not at all  6) How often have you had to push or strain to begin urination?  0 - Not at all  7) How many times did you most typically get up to urinate from the time you went to bed until the time you got up in the morning?  0 - None  Total score:  0-7 mildly symptomatic   8-19 moderately symptomatic   20-35 severely symptomatic     Diet: eats mostly at home, lots of vegetables but not enough fruit, he has lactose intolerance  Exercise: continue regular activity, also has a physical job  Depression: phq 9 is negative Depression screen [facility] 2/9 05/02/2021 03/12/2021 12/12/2020 08/03/2020 05/09/2020  Decreased Interest 0 0 0 0 0  Down, Depressed, Hopeless 0 0 0 0 0  PHQ - 2 Score 0 0 0 0 0  Altered sleeping 0 - - - -  Tired, decreased energy 0 - - - -  Change in appetite 0 - - - -  Feeling bad or failure about yourself  0 - - - -  Trouble concentrating 0 - - - -  Moving slowly or fidgety/restless 0 - - - -  Suicidal thoughts 0 - - - -  PHQ-9 Score 0 - - - -  Difficult doing work/chores - - - - -  Some encounter information is confidential and restricted. Go to Review Flowsheets activity to see all data.  Some recent data might be hidden    Hypertension:  BP Readings from Last 3 Encounters:   05/02/21 112/64  03/12/21 111/63  12/12/20 106/62    Obesity: Wt Readings from Last 3 Encounters:  05/02/21 130 lb (59 kg)  03/12/21 138 lb (62.6 kg)  12/12/20 138 lb (62.6 kg)   BMI Readings from Last 3 Encounters:  05/02/21 19.20 kg/m  03/12/21 20.38 kg/m  12/12/20 20.38 kg/m     Lipids:  Lab Results  Component Value Date   CHOL 157 07/21/2020   CHOL 159 08/27/2019   CHOL 129 07/15/2018   Lab Results  Component Value Date   HDL 35 (L) 07/21/2020   HDL 43 08/27/2019   HDL 27 (L) 07/15/2018   Lab Results  Component Value Date   LDLCALC 102 (H) 07/21/2020   LDLCALC 102 (H) 08/27/2019   LDLCALC 87 07/15/2018   Lab Results  Component Value Date   TRIG 106 07/21/2020   TRIG 46 08/27/2019   TRIG 67 07/15/2018   Lab Results  Component Value Date   CHOLHDL 4.5 07/21/2020   CHOLHDL 3.7 08/27/2019   CHOLHDL 4.8 07/15/2018  No results found for: LDLDIRECT Glucose:  Glucose, Bld  Date Value Ref Range Status  11/27/2020 93 65 - 99 mg/dL Final    Comment:    .            Fasting reference interval .   07/21/2020 87 65 - 99 mg/dL Final    Comment:    .            Fasting reference interval .   08/27/2019 84 65 - 99 mg/dL Final    Comment:    .            Fasting reference interval .     Flowsheet Row [facility]  AUDIT-C Score 2        Single STD testing and prevention (HIV/chl/gon/syphilis): today, goes to HIV clinic  Hep C: 07/15/18  Skin cancer: Discussed monitoring for atypical lesions  ECG:  1/29/9  Vaccines:  Up to date   Advanced Care Planning: A voluntary discussion about advance care planning including the explanation and discussion of advance directives.  Discussed health care proxy and Living will, and the patient was able to identify a health care proxy as mother .    Patient Active Problem List   Diagnosis Date Noted   Screening for STDs (sexually transmitted diseases)  03/29/2019   Asymptomatic HIV infection ([facility]) 07/30/2018   Vitamin D deficiency 11/25/2009   Allergic rhinitis 01/17/2009    Past Surgical History:  Procedure Laterality Date   NO PAST SURGERIES     TONSILLECTOMY AND ADENOIDECTOMY N/A 09/08/2018   Procedure: TONSILLECTOMY AND possible  ADENOIDECTOMY;  Surgeon: Geanie LoganBennett, Paul, MD;  [facility];  Service: ENT;  Laterality: N/A;    Family History  Problem Relation Age of Onset   Healthy Mother    Migraines Mother    Healthy Father     Social History   Socioeconomic History   Marital status: Single    Spouse name: Not on file   Number of children: Not on file   Years of education: Not on file   Highest education level: Not on file  Occupational History    Comment: call center  Tobacco Use   Smoking status: Never   Smokeless tobacco: Never  Vaping Use   Vaping Use: Never used  Substance and Sexual Activity   Alcohol use: Yes    Alcohol/week: 0.0 standard drinks    Comment: occ   Drug use: No   Sexual activity: Yes    Partners: Male    Birth control/protection: Condom    Comment: Decline condoms 03/2021  Other Topics Concern   Not on file  Social History Narrative   He lives with his mother, step-dad , younger sister.    He was taking classes a [employer], but has taken some time off.    Social Determinants of Health   Financial Resource Strain: Low Risk    Difficulty of Paying Living Expenses: Not hard at all  Food Insecurity: No Food Insecurity   Worried About Programme researcher, broadcasting/film/videounning Out of Food in the Last Year: Never true   Ran Out of Food in the Last Year: Never true  Transportation Needs: No Transportation Needs   Lack of Transportation (Medical): No   Lack of Transportation (Non-Medical): No  Physical Activity: Sufficiently Active   Days of Exercise per Week: 5 days   Minutes of Exercise per Session: 80 min  Stress: Stress Concern Present   Feeling  of Stress : To some extent  Social Connections: Moderately  Integrated   Frequency of Communication with Friends and Family: Three times a week   Frequency of Social Gatherings with Friends and Family: Twice a week   Attends Religious Services: More than 4 times per year   Active Member of [employer] or Organizations: Yes   Attends Banker Meetings: 1 to 4 times per year   Marital Status: Never married  Catering manager Violence: Not At Risk   Fear of Current or Ex-Partner: No   Emotionally Abused: No   Physically Abused: No   Sexually Abused: No     Current Outpatient Medications:    Dolutegravir-lamiVUDine 50-300 MG TABS, TAKE 1 TABLET BY MOUTH DAILY., Disp: 30 tablet, Rfl: 11  No Known Allergies   ROS  Constitutional: Negative for fever , positive for  weight change.  Respiratory: Negative for cough and shortness of breath.   Cardiovascular: Negative for chest pain or palpitations.  Gastrointestinal: Negative for abdominal pain, no bowel changes.  Musculoskeletal: Negative for gait problem or joint swelling.  Skin: Negative for rash.  Neurological: Negative for dizziness or headache.  No other specific complaints in a complete review of systems (except as listed in HPI above).    Objective  Vitals:   05/02/21 1108  BP: 112/64  Pulse: 81  Resp: 16  Temp: 98.3 F (36.8 C)  TempSrc: Oral  SpO2: 98%  Weight: 130 lb (59 kg)  Height: 5\' 9"  (1.753 m)    Body mass index is 19.2 kg/m.  Physical Exam  Constitutional: Patient appears well-developed and well-nourished. No distress.  HENT: Head: Normocephalic and atraumatic. Ears: B TMs ok, no erythema or effusion; Nose: Nose normal. Mouth/Throat: not done  Eyes: Conjunctivae and EOM are normal. Pupils are equal, round, and reactive to light. No scleral icterus.  Neck: Normal range of motion. Neck supple. No JVD present. No thyromegaly present.  Cardiovascular: Normal rate, regular rhythm and normal heart sounds.  No murmur heard. No BLE edema. Pulmonary/Chest: Effort  normal and breath sounds normal. No respiratory distress. Abdominal: Soft. Bowel sounds are normal, no distension. There is no tenderness. no masses MALE GENITALIA: Normal descended testes bilaterally, no masses palpated, no hernias, no lesions, no discharge, some hypopigmentation on gland likely from previous inflammation  RECTAL: not done Musculoskeletal: Normal range of motion, no joint effusions. No gross deformities Neurological: he is alert and oriented to person, place, and time. No cranial nerve deficit. Coordination, balance, strength, speech and gait are normal.  Skin: Skin is warm and dry. No rash noted. No erythema.  Psychiatric: Patient has a normal mood and affect. behavior is normal. Judgment and thought content normal.   Recent Results (from the past 2160 hour(s))  Cytology (oral, anal, urethral) ancillary only     Status: None   Collection Time: 03/12/21 10:37 AM  Result Value Ref Range   Neisseria Gonorrhea Negative    Chlamydia Negative    Comment Normal Reference Ranger Chlamydia - Negative    Comment      Normal Reference Range Neisseria Gonorrhea - Negative  Cytology (oral, anal, urethral) ancillary only     Status: None   Collection Time: 03/12/21 10:38 AM  Result Value Ref Range   Neisseria Gonorrhea Negative    Chlamydia Negative    Comment Normal Reference Ranger Chlamydia - Negative    Comment      Normal Reference Range Neisseria Gonorrhea - Negative  Urine cytology ancillary only  Status: None   Collection Time: 03/12/21 10:38 AM  Result Value Ref Range   Neisseria Gonorrhea Negative    Chlamydia Negative    Comment Normal Reference Ranger Chlamydia - Negative    Comment      Normal Reference Range Neisseria Gonorrhea - Negative  HIV-1 RNA quant-no reflex-bld     Status: Abnormal   Collection Time: 03/12/21 11:10 AM  Result Value Ref Range   HIV 1 RNA Quant 22 (H) Copies/mL   HIV-1 RNA Quant, Log 1.35 (H) Log cps/mL    Comment: . Reference  Range:                           Not Detected     copies/mL                           Not Detected Log copies/mL . Marland Kitchen The test was performed using Real-Time Polymerase Chain Reaction. . . Reportable Range: 20 copies/mL to 10,000,000 copies/mL (1.30 Log copies/mL to 7.00 Log copies/mL). .   RPR     Status: None   Collection Time: 03/12/21 11:10 AM  Result Value Ref Range   RPR Ser Ql NON-REACTIVE NON-REACTIVE     Fall Risk: Fall Risk  05/02/2021 03/12/2021 12/12/2020 08/03/2020 05/09/2020  Falls in the past year? 0 0 0 0 0  Number falls in past yr: 0 - 0 - -  Injury with Fall? 0 - 0 - -  Risk for fall due to : - - - No Fall Risks No Fall Risks  Follow up - - - Falls evaluation completed Falls evaluation completed  Some encounter information is confidential and restricted. Go to Review Flowsheets activity to see all data.      Functional Status Survey: Is the patient deaf or have difficulty hearing?: No Does the patient have difficulty seeing, even when wearing glasses/contacts?: No Does the patient have difficulty concentrating, remembering, or making decisions?: No Does the patient have difficulty walking or climbing stairs?: No Does the patient have difficulty dressing or bathing?: No Does the patient have difficulty doing errands alone such as visiting a doctor's office or shopping?: No    Assessment & Plan  1. Well adult exam  - RPR - Lipid panel - COMPLETE METABOLIC PANEL WITH GFR - CBC with Differential/Platelet - Cytology (oral, anal, urethral) ancillary only - Cytology (oral, anal, urethral) ancillary only - Cytology (oral, anal, urethral) ancillary only  2. Lipid screening  - Lipid panel  3. Routine screening for STI (sexually transmitted infection)  - RPR - Cytology (oral, anal, urethral) ancillary only - Cytology (oral, anal, urethral) ancillary only - Cytology (oral, anal, urethral) ancillary only  4. Long-term use of high-risk medication  -  COMPLETE METABOLIC PANEL WITH GFR - CBC with Differential/Platelet    -Prostate cancer screening and PSA options (with potential risks and benefits of testing vs not testing) were discussed along with recent recs/guidelines. -USPSTF grade A and B recommendations reviewed with patient; age-appropriate recommendations, preventive care, screening tests, etc discussed and encouraged; healthy living encouraged; see AVS for patient education given to patient -Discussed importance of 150 minutes of physical activity weekly, eat two servings of fish weekly, eat one serving of tree nuts ( cashews, pistachios, pecans, almonds.Marland Kitchen) every other day, eat 6 servings of fruit/vegetables daily and drink plenty of water and avoid sweet beverages.

## 2021-05-02 ENCOUNTER — Ambulatory Visit (INDEPENDENT_AMBULATORY_CARE_PROVIDER_SITE_OTHER): Payer: BC Managed Care – PPO | Admitting: Family Medicine

## 2021-05-02 ENCOUNTER — Other Ambulatory Visit (HOSPITAL_COMMUNITY)
Admission: RE | Admit: 2021-05-02 | Discharge: 2021-05-02 | Disposition: A | Discharge status: Home / Self Care | Payer: 59 | Source: Ambulatory Visit | Attending: Family Medicine | Admitting: Family Medicine

## 2021-05-02 ENCOUNTER — Other Ambulatory Visit: Payer: Self-pay

## 2021-05-02 ENCOUNTER — Encounter: Payer: Self-pay | Admitting: Family Medicine

## 2021-05-02 ENCOUNTER — Other Ambulatory Visit (HOSPITAL_COMMUNITY): Payer: Self-pay

## 2021-05-02 VITALS — BP 112/64 | HR 81 | Temp 98.3°F | Resp 16 | Ht 69.0 in | Wt 130.0 lb

## 2021-05-02 DIAGNOSIS — Z79899 Other long term (current) drug therapy: Secondary | ICD-10-CM | POA: Diagnosis not present

## 2021-05-02 DIAGNOSIS — Z13220 Encounter for screening for lipoid disorders: Secondary | ICD-10-CM

## 2021-05-02 DIAGNOSIS — Z0000 Encounter for general adult medical examination without abnormal findings: Secondary | ICD-10-CM

## 2021-05-02 DIAGNOSIS — Z113 Encounter for screening for infections with a predominantly sexual mode of transmission: Secondary | ICD-10-CM | POA: Diagnosis not present

## 2021-05-02 MED FILL — Dolutegravir Sodium-Lamivudine Tab 50-300 MG (Base Eq): ORAL | 30 days supply | Qty: 30 | Fill #4 | Status: AC

## 2021-05-03 LAB — COMPLETE METABOLIC PANEL WITH GFR
AG Ratio: 2.2 (calc) (ref 1.0–2.5)
ALT: 11 U/L (ref 9–46)
AST: 15 U/L (ref 10–40)
Albumin: 4.8 g/dL (ref 3.6–5.1)
Alkaline phosphatase (APISO): 64 U/L (ref 36–130)
BUN: 10 mg/dL (ref 7–25)
CO2: 28 mmol/L (ref 20–32)
Calcium: 9.8 mg/dL (ref 8.6–10.3)
Chloride: 104 mmol/L (ref 98–110)
Creat: 0.97 mg/dL (ref 0.60–1.24)
Globulin: 2.2 g/dL (calc) (ref 1.9–3.7)
Glucose, Bld: 78 mg/dL (ref 65–99)
Potassium: 4.3 mmol/L (ref 3.5–5.3)
Sodium: 139 mmol/L (ref 135–146)
Total Bilirubin: 0.4 mg/dL (ref 0.2–1.2)
Total Protein: 7 g/dL (ref 6.1–8.1)
eGFR: 112 mL/min/{1.73_m2} (ref >=60)

## 2021-05-03 LAB — LIPID PANEL
Cholesterol: 141 mg/dL (ref <200)
HDL: 53 mg/dL (ref >=40)
LDL Cholesterol (Calc): 75 mg/dL (calc)
Non-HDL Cholesterol (Calc): 88 mg/dL (calc) (ref <130)
Total CHOL/HDL Ratio: 2.7 (calc) (ref <5.0)
Triglycerides: 51 mg/dL (ref <150)

## 2021-05-03 LAB — CBC WITH DIFFERENTIAL/PLATELET
Absolute Monocytes: 308 cells/uL (ref 200–950)
Basophils Absolute: 49 cells/uL (ref 0–200)
Basophils Relative: 1.4 %
Eosinophils Absolute: 119 cells/uL (ref 15–500)
Eosinophils Relative: 3.4 %
HCT: 45.5 % (ref 38.5–50.0)
Hemoglobin: 14.7 g/dL (ref 13.2–17.1)
Lymphs Abs: 1194 cells/uL (ref 850–3900)
MCH: 27.4 pg (ref 27.0–33.0)
MCHC: 32.3 g/dL (ref 32.0–36.0)
MCV: 84.9 fL (ref 80.0–100.0)
MPV: 9.5 fL (ref 7.5–12.5)
Monocytes Relative: 8.8 %
Neutro Abs: 1831 cells/uL (ref 1500–7800)
Neutrophils Relative %: 52.3 %
Platelets: 256 10*3/uL (ref 140–400)
RBC: 5.36 10*6/uL (ref 4.20–5.80)
RDW: 14.3 % (ref 11.0–15.0)
Total Lymphocyte: 34.1 %
WBC: 3.5 10*3/uL — ABNORMAL LOW (ref 3.8–10.8)

## 2021-05-03 LAB — CYTOLOGY, (ORAL, ANAL, URETHRAL) ANCILLARY ONLY
Chlamydia: NEGATIVE
Chlamydia: NEGATIVE
Chlamydia: NEGATIVE
Comment: NEGATIVE
Comment: NEGATIVE
Comment: NEGATIVE
Comment: NORMAL
Comment: NORMAL
Comment: NORMAL
Neisseria Gonorrhea: NEGATIVE
Neisseria Gonorrhea: NEGATIVE
Neisseria Gonorrhea: NEGATIVE

## 2021-05-03 LAB — RPR: RPR Ser Ql: NONREACTIVE

## 2021-05-09 ENCOUNTER — Other Ambulatory Visit (HOSPITAL_COMMUNITY): Payer: Self-pay

## 2021-06-01 ENCOUNTER — Other Ambulatory Visit (HOSPITAL_COMMUNITY): Payer: Self-pay

## 2021-06-01 MED FILL — Dolutegravir Sodium-Lamivudine Tab 50-300 MG (Base Eq): ORAL | 30 days supply | Qty: 30 | Fill #5 | Status: AC

## 2021-06-07 ENCOUNTER — Encounter: Payer: Self-pay | Admitting: Infectious Diseases

## 2021-06-07 ENCOUNTER — Other Ambulatory Visit: Payer: Self-pay

## 2021-06-07 ENCOUNTER — Ambulatory Visit (INDEPENDENT_AMBULATORY_CARE_PROVIDER_SITE_OTHER): Payer: 59 | Admitting: Infectious Diseases

## 2021-06-07 ENCOUNTER — Other Ambulatory Visit (HOSPITAL_COMMUNITY)
Admission: RE | Admit: 2021-06-07 | Discharge: 2021-06-07 | Disposition: A | Discharge status: Home / Self Care | Payer: 59 | Source: Ambulatory Visit | Attending: Infectious Diseases | Admitting: Infectious Diseases

## 2021-06-07 VITALS — BP 96/64 | HR 65 | Temp 98.6°F | Wt 139.8 lb

## 2021-06-07 DIAGNOSIS — Z113 Encounter for screening for infections with a predominantly sexual mode of transmission: Secondary | ICD-10-CM | POA: Diagnosis not present

## 2021-06-07 DIAGNOSIS — Z7185 Encounter for immunization safety counseling: Secondary | ICD-10-CM | POA: Diagnosis not present

## 2021-06-07 DIAGNOSIS — Z21 Asymptomatic human immunodeficiency virus [HIV] infection status: Secondary | ICD-10-CM

## 2021-06-07 NOTE — Progress Notes (Signed)
Name: Kurt Simpson  DOB: 1996/08/07 MRN: [id number] PCP: Steele Sizer, MD    Brief Narrative:  Kurt Simpson is a 25 y.o. male with HIV infection diagnosed in October 2019.  History of OIs: none.  HIV Risk: MSM/bisexual.  CD4 nadir 310/VL 263,000.  CHE^N2778 (-), Quantiferon (-). Hep B sAg (-)  Previous Regimens: Dovato --> suppressed   Genotypes: 07/2018 - no mutations    Subjective:   Chief Complaint  Patient presents with   Follow-up    B20       HPI:  Kurt Simpson is doing well - no concerns. Would like STI screening today - no symptoms or known exposures to be concerned about.  Questions about monkeypox vaccine / illness.  Planning to get COVID booster and flu shot soon. Going on a trip to [place] soon.   Taking dovato everyday without any concern for missed doses. He got an approval letter to start cabenuva injections and is still interested in doing these for treatment.   No concern over anxious/depressed mood. Sleeping and eating well.     Review of Systems  Constitutional:  Negative for chills and fever.  HENT:  Negative for sore throat.   Eyes:  Negative for visual disturbance.  Gastrointestinal:  Negative for abdominal pain, anal bleeding and rectal pain.  Genitourinary:  Negative for dysuria, genital sores, penile discharge, penile pain, scrotal swelling and testicular pain.  Musculoskeletal:  Negative for arthralgias and joint swelling.  Skin:  Negative for rash.  Neurological:  Negative for headaches.  Hematological:  Negative for adenopathy.    Past Medical History:  Diagnosis Date   Allergy    Enlarged tonsils 04/25/2016   HIV infection ([place])    Mononucleosis    Palpitations    Vitamin D deficiency     Outpatient Medications Prior to Visit  Medication Sig Dispense Refill   Dolutegravir-lamiVUDine 50-300 MG TABS TAKE 1 TABLET BY MOUTH DAILY. 30 tablet 11   No facility-administered medications prior to visit.    No Known  Allergies  Social History   Tobacco Use   Smoking status: Never   Smokeless tobacco: Never  Vaping Use   Vaping Use: Never used  Substance Use Topics   Alcohol use: Yes    Alcohol/week: 0.0 standard drinks    Comment: occ   Drug use: No    Social History   Substance and Sexual Activity  Sexual Activity Yes   Partners: Male   Birth control/protection: Condom   Comment: Decline condoms     Objective:   Vitals:   06/07/21 1444  BP: 96/64  Pulse: 65  Temp: 98.6 F (37 C)  TempSrc: Oral  SpO2: 98%  Weight: 139 lb 12.8 oz (63.4 kg)   Body mass index is 20.64 kg/m.  Physical Exam Constitutional:      Appearance: Normal appearance. He is not ill-appearing.  HENT:     Head: Normocephalic.     Mouth/Throat:     Mouth: Mucous membranes are moist.     Pharynx: Oropharynx is clear.  Eyes:     General: No scleral icterus. Pulmonary:     Effort: Pulmonary effort is normal.  Musculoskeletal:        General: Normal range of motion.     Cervical back: Normal range of motion.  Skin:    Coloration: Skin is not jaundiced or pale.  Neurological:     Mental Status: He is alert and oriented to person, place, and time.  Psychiatric:  Mood and Affect: Mood normal.        Judgment: Judgment normal.     Lab Results Lab Results  Component Value Date   WBC 3.5 (L) 05/02/2021   HGB 14.7 05/02/2021   HCT 45.5 05/02/2021   MCV 84.9 05/02/2021   PLT 256 05/02/2021    Lab Results  Component Value Date   CREATININE 0.97 05/02/2021   BUN 10 05/02/2021   NA 139 05/02/2021   K 4.3 05/02/2021   CL 104 05/02/2021   CO2 28 05/02/2021    Lab Results  Component Value Date   ALT 11 05/02/2021   AST 15 05/02/2021   ALKPHOS 67 05/10/2018   BILITOT 0.4 05/02/2021    Lab Results  Component Value Date   CHOL 141 05/02/2021   HDL 53 05/02/2021   LDLCALC 75 05/02/2021   TRIG 51 05/02/2021   CHOLHDL 2.7 05/02/2021   HIV 1 RNA Quant (Copies/mL)  Date Value   03/12/2021 22 (H)  11/27/2020 45 (H)  07/21/2020 <20   CD4 T Cell Abs (/uL)  Date Value  11/27/2020 415  07/21/2020 555  05/09/2020 488     Assessment & Plan:   Problem List Items Addressed This Visit       Unprioritized   Asymptomatic HIV infection ([place]) (Chronic)    Doing well on Dovato once daily. Update VL today. We discussed transition to cabenuva - will have him send me a photo of the approval letter via mychart and help him schedule accordingly at his return for loading injection. He will continue dovato once daily until he switches to long acting injectable.       Relevant Orders   HIV-1 RNA quant-no reflex-bld   T-helper cell (CD4)- ([facility] clinic only)   Vaccine counseling    Counseled regarding jynneos vaccine today - he does qualify for it and he will consider it. Will reach out to our team for nurse visit if he wishes to get this. Advised to do no later than next week with upcoming trip so not to delay 2nd dose.   Counseled re: flu vaccine and new COVID bivalent vaccine coming out soon. He will get these when available.       Screening for STDs (sexually transmitted diseases)    Screening today.       Other Visit Diagnoses     Routine screening for STI (sexually transmitted infection)    -  Primary   Relevant Orders   RPR   Urine cytology ancillary only   Cytology (oral, anal, urethral) ancillary only   Cytology (oral, anal, urethral) ancillary only       Janene Madeira, MSN, NP-C [facility] [email or web address] Pager: [phone] Office: [place]: [phone]   06/07/21  3:36 PM

## 2021-06-07 NOTE — Assessment & Plan Note (Signed)
Counseled regarding jynneos vaccine today - he does qualify for it and he will consider it. Will reach out to our team for nurse visit if he wishes to get this. Advised to do no later than next week with upcoming trip so not to delay 2nd dose.   Counseled re: flu vaccine and new COVID bivalent vaccine coming out soon. He will get these when available.

## 2021-06-07 NOTE — Assessment & Plan Note (Signed)
Screening today 

## 2021-06-07 NOTE — Patient Instructions (Addendum)
Jynneos is the Monkeypox vaccine - you can come here next week to get it if you call us to schedule.   Recommend flu shot when you can and the new bivalent COVID booster shot when it comes out - this has the older strain the original vaccine was created from and the newer Omicron like strains. Should be out mid to late September from what I am hearing  We can get you transitioned to [place] when you return from your trip. Send me a photo of what that approval letter says via your mychart portal please.   Schedule a visit at our return with either me or our pharmacy team to start the cabenvua that fits with your schedule so we can have you back 30 days later from your first injection.

## 2021-06-07 NOTE — Assessment & Plan Note (Signed)
Doing well on Dovato once daily. Update VL today. We discussed transition to cabenuva - will have him send me a photo of the approval letter via mychart and help him schedule accordingly at his return for loading injection. He will continue dovato once daily until he switches to long acting injectable.

## 2021-06-08 ENCOUNTER — Other Ambulatory Visit (HOSPITAL_COMMUNITY): Payer: Self-pay

## 2021-06-08 LAB — URINE CYTOLOGY ANCILLARY ONLY
Chlamydia: NEGATIVE
Comment: NEGATIVE
Comment: NORMAL
Neisseria Gonorrhea: NEGATIVE

## 2021-06-08 LAB — CYTOLOGY, (ORAL, ANAL, URETHRAL) ANCILLARY ONLY
Chlamydia: NEGATIVE
Chlamydia: NEGATIVE
Comment: NEGATIVE
Comment: NEGATIVE
Comment: NORMAL
Comment: NORMAL
Neisseria Gonorrhea: NEGATIVE
Neisseria Gonorrhea: NEGATIVE

## 2021-06-08 LAB — T-HELPER CELL (CD4) - (RCID CLINIC ONLY)
CD4 % Helper T Cell: 35 % (ref 33–65)
CD4 T Cell Abs: 513 /uL (ref 400–1790)

## 2021-06-10 LAB — HIV-1 RNA QUANT-NO REFLEX-BLD
HIV 1 RNA Quant: NOT DETECTED Copies/mL
HIV-1 RNA Quant, Log: NOT DETECTED Log cps/mL

## 2021-06-10 LAB — RPR: RPR Ser Ql: NONREACTIVE

## 2021-06-19 ENCOUNTER — Other Ambulatory Visit: Payer: Self-pay

## 2021-06-19 ENCOUNTER — Ambulatory Visit (INDEPENDENT_AMBULATORY_CARE_PROVIDER_SITE_OTHER): Payer: 59

## 2021-06-19 DIAGNOSIS — Z23 Encounter for immunization: Secondary | ICD-10-CM

## 2021-06-19 NOTE — Progress Notes (Signed)
Flu shot given

## 2021-06-27 ENCOUNTER — Other Ambulatory Visit (HOSPITAL_COMMUNITY): Payer: Self-pay

## 2021-06-29 ENCOUNTER — Other Ambulatory Visit (HOSPITAL_COMMUNITY): Payer: Self-pay

## 2021-06-29 MED FILL — Dolutegravir Sodium-Lamivudine Tab 50-300 MG (Base Eq): ORAL | 30 days supply | Qty: 30 | Fill #6 | Status: CN

## 2021-07-02 ENCOUNTER — Other Ambulatory Visit (HOSPITAL_COMMUNITY): Payer: Self-pay

## 2021-07-10 ENCOUNTER — Other Ambulatory Visit (HOSPITAL_COMMUNITY): Payer: Self-pay

## 2021-07-14 ENCOUNTER — Other Ambulatory Visit (HOSPITAL_COMMUNITY): Payer: Self-pay

## 2021-07-14 MED FILL — Dolutegravir Sodium-Lamivudine Tab 50-300 MG (Base Eq): ORAL | 30 days supply | Qty: 30 | Fill #6 | Status: AC

## 2021-07-23 ENCOUNTER — Ambulatory Visit: Payer: 59 | Admitting: Infectious Diseases

## 2021-08-03 ENCOUNTER — Other Ambulatory Visit (HOSPITAL_COMMUNITY): Payer: Self-pay

## 2021-08-03 ENCOUNTER — Other Ambulatory Visit: Payer: Self-pay | Admitting: Infectious Diseases

## 2021-08-03 DIAGNOSIS — Z21 Asymptomatic human immunodeficiency virus [HIV] infection status: Secondary | ICD-10-CM

## 2021-08-03 MED ORDER — DOVATO 50-300 MG PO TABS
1.0000 | ORAL_TABLET | Freq: Every day | ORAL | 2 refills | Status: DC
  Filled 2021-08-03: qty 30, 30d supply, fill #0
  Filled 2021-09-05: qty 30, 30d supply, fill #1

## 2021-08-08 ENCOUNTER — Other Ambulatory Visit (HOSPITAL_COMMUNITY): Payer: Self-pay

## 2021-09-03 ENCOUNTER — Other Ambulatory Visit (HOSPITAL_COMMUNITY): Payer: Self-pay

## 2021-09-05 ENCOUNTER — Other Ambulatory Visit (HOSPITAL_COMMUNITY): Payer: Self-pay

## 2021-09-18 ENCOUNTER — Other Ambulatory Visit (HOSPITAL_COMMUNITY)
Admission: RE | Admit: 2021-09-18 | Discharge: 2021-09-18 | Disposition: A | Discharge status: Home / Self Care | Payer: 59 | Source: Ambulatory Visit | Attending: Infectious Diseases | Admitting: Infectious Diseases

## 2021-09-18 ENCOUNTER — Encounter: Payer: Self-pay | Admitting: Infectious Diseases

## 2021-09-18 ENCOUNTER — Ambulatory Visit (INDEPENDENT_AMBULATORY_CARE_PROVIDER_SITE_OTHER): Payer: 59 | Admitting: Infectious Diseases

## 2021-09-18 ENCOUNTER — Other Ambulatory Visit: Payer: Self-pay

## 2021-09-18 ENCOUNTER — Other Ambulatory Visit (HOSPITAL_COMMUNITY): Payer: Self-pay

## 2021-09-18 ENCOUNTER — Other Ambulatory Visit: Payer: Self-pay | Admitting: Pharmacist

## 2021-09-18 VITALS — BP 114/71 | HR 48 | Temp 98.3°F | Wt 138.0 lb

## 2021-09-18 DIAGNOSIS — Z21 Asymptomatic human immunodeficiency virus [HIV] infection status: Secondary | ICD-10-CM

## 2021-09-18 DIAGNOSIS — Z113 Encounter for screening for infections with a predominantly sexual mode of transmission: Secondary | ICD-10-CM | POA: Insufficient documentation

## 2021-09-18 DIAGNOSIS — B20 Human immunodeficiency virus [HIV] disease: Secondary | ICD-10-CM

## 2021-09-18 MED ORDER — CABOTEGRAVIR & RILPIVIRINE ER 600 & 900 MG/3ML IM SUER
1.0000 | Freq: Once | INTRAMUSCULAR | 0 refills | Status: AC
Start: 2020-10-22 — End: 2021-09-21
  Filled 2021-09-18: qty 6, 1d supply, fill #0
  Filled 2021-09-19: qty 6, 30d supply, fill #0

## 2021-09-18 MED ORDER — CABOTEGRAVIR & RILPIVIRINE ER 600 & 900 MG/3ML IM SUER
1.0000 | INTRAMUSCULAR | 5 refills | Status: DC
  Filled 2021-09-18: qty 6, 60d supply, fill #0
  Filled 2021-10-04: qty 6, 30d supply, fill #0
  Filled 2021-12-03: qty 6, 30d supply, fill #1
  Filled 2022-01-30: qty 6, 30d supply, fill #2
  Filled 2022-04-02: qty 6, 30d supply, fill #3
  Filled 2022-06-03: qty 6, 30d supply, fill #4
  Filled 2022-07-24: qty 6, 30d supply, fill #5

## 2021-09-18 MED ORDER — CABOTEGRAVIR & RILPIVIRINE ER 600 & 900 MG/3ML IM SUER
1.0000 | Freq: Once | INTRAMUSCULAR | 0 refills | Status: DC
  Filled 2021-09-18: qty 6, 1d supply, fill #0

## 2021-09-18 NOTE — Assessment & Plan Note (Addendum)
Counseled re: switching to LA injectable cabenuva. Patient assistance approved - will start loading dose 600/900 mg IM Friday. He will continue Dovato until then. He is interested in the q86m dosing schedule. Will plan for this and VL monitoring Q28m.

## 2021-09-18 NOTE — Patient Instructions (Signed)
See you Friday.

## 2021-09-18 NOTE — Progress Notes (Signed)
Name: Kurt Simpson  DOB: 06-18-1996 MRN: [id number] PCP: Steele Sizer, MD    Brief Narrative:  Kurt Simpson is a 25 y.o. male with HIV infection diagnosed in October 2019.  History of OIs: none.  HIV Risk: MSM/bisexual.  CD4 nadir 310/VL 263,000.  DZH^[id number] (-), Quantiferon (-). Hep B sAg (-)  Previous Regimens: Dovato --> suppressed   Genotypes: 07/2018 - no mutations    Subjective:   Chief Complaint  Patient presents with   Follow-up    B20      HPI:  Kurt Simpson is here for routine follow up for well controlled HIV.   Taking dovato everyday without any concern for missed doses. Kurt Simpson got an approval letter to start cabenuva injections and is still interested in doing these for treatment. Kurt Simpson has been considering this for about 6 months. Would like to discuss process further.   No concern over anxious/depressed mood. Sleeping and eating well. Requesting STI screening - no symptoms currently or known exposures.     Review of Systems  Constitutional:  Negative for chills and fever.  HENT:  Negative for sore throat.   Eyes:  Negative for visual disturbance.  Gastrointestinal:  Negative for abdominal pain, anal bleeding and rectal pain.  Genitourinary:  Negative for dysuria, genital sores, penile discharge, penile pain, scrotal swelling and testicular pain.  Musculoskeletal:  Negative for arthralgias and joint swelling.  Skin:  Negative for rash.  Neurological:  Negative for headaches.  Hematological:  Negative for adenopathy.    Past Medical History:  Diagnosis Date   Allergy    Enlarged tonsils 04/25/2016   HIV infection ([place])    Mononucleosis    Palpitations    Vitamin D deficiency     Outpatient Medications Prior to Visit  Medication Sig Dispense Refill   dolutegravir-lamiVUDine (DOVATO) 50-300 MG tablet TAKE 1 TABLET BY MOUTH DAILY. 30 tablet 2   No facility-administered medications prior to visit.    No Known Allergies  Social History   Tobacco  Use   Smoking status: Never   Smokeless tobacco: Never  Vaping Use   Vaping Use: Never used  Substance Use Topics   Alcohol use: Yes    Alcohol/week: 0.0 standard drinks    Comment: occ   Drug use: No    Social History   Substance and Sexual Activity  Sexual Activity Yes   Partners: Male   Birth control/protection: Condom   Comment: Decline condoms     Objective:   Vitals:   09/18/21 1407  BP: 114/71  Pulse: (!) 48  Temp: 98.3 F (36.8 C)  TempSrc: Oral  Weight: 138 lb (62.6 kg)   Body mass index is 20.38 kg/m.  Physical Exam Constitutional:      Appearance: Normal appearance. Kurt Simpson is not ill-appearing.  HENT:     Head: Normocephalic.     Mouth/Throat:     Mouth: Mucous membranes are moist.     Pharynx: Oropharynx is clear.  Eyes:     General: No scleral icterus. Pulmonary:     Effort: Pulmonary effort is normal.  Musculoskeletal:        General: Normal range of motion.     Cervical back: Normal range of motion.  Skin:    Coloration: Skin is not jaundiced or pale.  Neurological:     Mental Status: Kurt Simpson is alert and oriented to person, place, and time.  Psychiatric:        Mood and Affect: Mood normal.  Judgment: Judgment normal.     Lab Results Lab Results  Component Value Date   WBC 3.5 (L) 05/02/2021   HGB 14.7 05/02/2021   HCT 45.5 05/02/2021   MCV 84.9 05/02/2021   PLT 256 05/02/2021    Lab Results  Component Value Date   CREATININE 0.97 05/02/2021   BUN 10 05/02/2021   NA 139 05/02/2021   K 4.3 05/02/2021   CL 104 05/02/2021   CO2 28 05/02/2021    Lab Results  Component Value Date   ALT 11 05/02/2021   AST 15 05/02/2021   ALKPHOS 67 05/10/2018   BILITOT 0.4 05/02/2021    Lab Results  Component Value Date   CHOL 141 05/02/2021   HDL 53 05/02/2021   LDLCALC 75 05/02/2021   TRIG 51 05/02/2021   CHOLHDL 2.7 05/02/2021   HIV 1 RNA Quant (Copies/mL)  Date Value  06/07/2021 Not Detected  03/12/2021 22 (H)  11/27/2020 45  (H)   CD4 T Cell Abs (/uL)  Date Value  06/07/2021 513  11/27/2020 415  07/21/2020 555     Assessment & Plan:   Problem List Items Addressed This Visit       Unprioritized   Asymptomatic HIV infection ([place]) - Primary (Chronic)    Counseled re: switching to LA injectable cabenuva. Patient assistance approved - will start loading dose 600/900 mg IM Friday. Kurt Simpson will continue Dovato until then. Kurt Simpson is interested in the q29mdosing schedule. Will plan for this and VL monitoring Q292m       Screening for STDs (sexually transmitted diseases)    Screen today with GC/C urine/oral/rectal and RPR.  No active symptoms or known exposures.       Relevant Orders   RPR (Completed)   Cytology (oral, anal, urethral) ancillary only (Completed)   Cytology (oral, anal, urethral) ancillary only (Completed)   Urine cytology ancillary only (Completed)   StJanene MadeiraMSN, NP-C [facility] [email or web address] Pager: [phone]: [phone] 09/24/21  1:32 PM

## 2021-09-19 ENCOUNTER — Other Ambulatory Visit (HOSPITAL_COMMUNITY): Payer: Self-pay

## 2021-09-19 LAB — RPR: RPR Ser Ql: NONREACTIVE

## 2021-09-20 ENCOUNTER — Telehealth: Payer: Self-pay

## 2021-09-20 LAB — CYTOLOGY, (ORAL, ANAL, URETHRAL) ANCILLARY ONLY
Chlamydia: NEGATIVE
Chlamydia: NEGATIVE
Comment: NEGATIVE
Comment: NEGATIVE
Comment: NORMAL
Comment: NORMAL
Neisseria Gonorrhea: NEGATIVE
Neisseria Gonorrhea: NEGATIVE

## 2021-09-20 LAB — URINE CYTOLOGY ANCILLARY ONLY
Chlamydia: NEGATIVE
Comment: NEGATIVE
Comment: NORMAL
Neisseria Gonorrhea: NEGATIVE

## 2021-09-20 NOTE — Telephone Encounter (Signed)
[facility] Patient Advocate Encounter  Patient's medication Renaldo Harrison) have been couriered to [facility] from [employer] and will be administered on the patient next office visit on 09/21/21.  [name] , CPhT Specialty Pharmacy Patient [facility] Phone: [phone] Fax:  [phone]

## 2021-09-21 ENCOUNTER — Ambulatory Visit (INDEPENDENT_AMBULATORY_CARE_PROVIDER_SITE_OTHER): Payer: 59 | Admitting: Pharmacist

## 2021-09-21 ENCOUNTER — Other Ambulatory Visit: Payer: Self-pay

## 2021-09-21 DIAGNOSIS — B20 Human immunodeficiency virus [HIV] disease: Secondary | ICD-10-CM | POA: Diagnosis not present

## 2021-09-21 MED ORDER — CABOTEGRAVIR & RILPIVIRINE ER 600 & 900 MG/3ML IM SUER
1.0000 | Freq: Once | INTRAMUSCULAR | Status: AC
  Administered 2021-09-21: 1 via INTRAMUSCULAR

## 2021-09-21 NOTE — Progress Notes (Signed)
HPI: Kurt Simpson is a 25 y.o. male who presents to the [facility] clinic for [place] administration.  Patient Active Problem List   Diagnosis Date Noted   Vaccine counseling 06/07/2021   Screening for STDs (sexually transmitted diseases) 03/29/2019   Asymptomatic HIV infection ([place]) 07/30/2018   Vitamin D deficiency 11/25/2009   Allergic rhinitis 01/17/2009    Patient's Medications  New Prescriptions   No medications on file  Previous Medications   CABOTEGRAVIR & RILPIVIRINE ER (CABENUVA) 600 & 900 MG/3ML INJECTION    Inject 1 kit into the muscle once for 1 dose.   CABOTEGRAVIR & RILPIVIRINE ER (CABENUVA) 600 & 900 MG/3ML INJECTION    Inject 1 kit into the muscle every 2 (two) months.   DOLUTEGRAVIR-LAMIVUDINE (DOVATO) 50-300 MG TABLET    TAKE 1 TABLET BY MOUTH DAILY.  Modified Medications   No medications on file  Discontinued Medications   No medications on file    Allergies: No Known Allergies  Past Medical History: Past Medical History:  Diagnosis Date   Allergy    Enlarged tonsils 04/25/2016   HIV infection ([place])    Mononucleosis    Palpitations    Vitamin D deficiency     Social History: Social History   Socioeconomic History   Marital status: Single    Spouse name: Not on file   Number of children: 0   Years of education: Not on file   Highest education level: Not on file  Occupational History   Not on file  Tobacco Use   Smoking status: Never   Smokeless tobacco: Never  Vaping Use   Vaping Use: Never used  Substance and Sexual Activity   Alcohol use: Yes    Alcohol/week: 0.0 standard drinks    Comment: occ   Drug use: No   Sexual activity: Yes    Partners: Male    Birth control/protection: Condom    Comment: Decline condoms  Other Topics Concern   Not on file  Social History Narrative   He lives with his mother, step-dad , younger sister.    He was taking classes a [employer], but has taken some time off.    Social Determinants of  Health   Financial Resource Strain: Low Risk    Difficulty of Paying Living Expenses: Not hard at all  Food Insecurity: No Food Insecurity   Worried About Charity fundraiser in the Last Year: Never true   [place] in the Last Year: Never true  Transportation Needs: No Transportation Needs   Lack of Transportation (Medical): No   Lack of Transportation (Non-Medical): No  Physical Activity: Sufficiently Active   Days of Exercise per Week: 5 days   Minutes of Exercise per Session: 80 min  Stress: Stress Concern Present   Feeling of Stress : To some extent  Social Connections: Moderately Integrated   Frequency of Communication with Friends and Family: Three times a week   Frequency of Social Gatherings with Friends and Family: Twice a week   Attends Religious Services: More than 4 times per year   Active Member of Clubs or Organizations: Yes   Attends Archivist Meetings: 1 to 4 times per year   Marital Status: Never married    Labs: Lab Results  Component Value Date   HIV1RNAQUANT Not Detected 06/07/2021   HIV1RNAQUANT 22 (H) 03/12/2021   HIV1RNAQUANT 45 (H) 11/27/2020   CD4TABS 513 06/07/2021   CD4TABS 415 11/27/2020   CD4TABS  555 07/21/2020    RPR and STI Lab Results  Component Value Date   LABRPR NON-REACTIVE 09/18/2021   LABRPR NON-REACTIVE 06/07/2021   LABRPR NON-REACTIVE 05/02/2021   LABRPR NON-REACTIVE 03/12/2021   LABRPR NON-REACTIVE 07/21/2020    STI Results GC CT  09/18/2021 Negative Negative  09/18/2021 Negative Negative  09/18/2021 Negative Negative  06/07/2021 Negative Negative  06/07/2021 Negative Negative  06/07/2021 Negative Negative  05/02/2021 Negative Negative  05/02/2021 Negative Negative  05/02/2021 Negative Negative  03/12/2021 Negative Negative  03/12/2021 Negative Negative  03/12/2021 Negative Negative  12/12/2020 Negative Negative  12/12/2020 Negative Negative  12/12/2020 Negative Negative  08/03/2020 Positive(A) Negative  08/03/2020  Negative Positive(A)  07/21/2020 Negative Negative  05/09/2020 Negative Positive(A)  05/09/2020 Negative Negative    Hepatitis B Lab Results  Component Value Date   HEPBSAB REACTIVE (A) 03/29/2019   HEPBSAG NON-REACTIVE 07/15/2018   HEPBCAB NON-REACTIVE 07/15/2018   Hepatitis C Lab Results  Component Value Date   HEPCAB NON-REACTIVE 07/15/2018   Hepatitis A Lab Results  Component Value Date   HAV REACTIVE (A) 07/15/2018   Lipids: Lab Results  Component Value Date   CHOL 141 05/02/2021   TRIG 51 05/02/2021   HDL 53 05/02/2021   CHOLHDL 2.7 05/02/2021   LDLCALC 75 05/02/2021    Current HIV Regimen: Dovato  TARGET DATE: The 16th of the month  Assessment: Kurt Simpson presents today for their first initiation injection for Cabenuva. Counseled that [place] is two separate intramuscular injections in the gluteal muscle on each side for each visit. Explained that the second injection is 30 days after the initial injection then every 2 months thereafter. Discussed the need for viral load monitoring every 2 months for the first 6 months and then periodically afterwards as their provider sees the need. Discussed the rare but significant chance of developing resistance despite compliance. Explained that showing up to injection appointments is very important and warned that if 2 appointments are missed, it will be reassessed by their provider whether they are a good candidate for injection therapy. Counseled on possible side effects associated with the injections such as injection site pain, which is usually mild to moderate in nature, injection site nodules, and injection site reactions. Asked to call the clinic or send me a mychart message if they experience any issues, such as fatigue, nausea, headache, rash, or dizziness. Advised that they can take ibuprofen or tylenol for injection site pain if needed.   Administered cabotegravir 635m/3mL in left upper outer quadrant of the gluteal muscle.  Administered rilpivirine 900 mg/359min the right upper outer quadrant of the gluteal muscle. Monitored patient for 10 minutes after injection. Injections were tolerated well without issue. Counseled to stop taking Dovato after today's dose and to call with any issues that may arise. Will make follow up appointments for second initiation injection in 30 days and then maintenance injections every 2 months thereafter for 6 months.   Plan: - Stop Dovato - First Cabenuva injections administered - Second initiation injection scheduled for 1/11 with me - Maintenance injections scheduled for 3/14 with [place] [place] 5[place]10 with me - Call with any issues or questions  AmAlfonse SprucePharmD, CPP Clinical Pharmacist Practitioner [facility]

## 2021-09-24 ENCOUNTER — Encounter: Payer: Self-pay | Admitting: Infectious Diseases

## 2021-09-24 NOTE — Assessment & Plan Note (Signed)
Screen today with GC/C urine/oral/rectal and RPR.  No active symptoms or known exposures.

## 2021-09-24 NOTE — Progress Notes (Signed)
Name: Kurt Simpson  DOB: June 17, 1996 MRN: [id number] PCP: Steele Sizer, MD    Brief Narrative:  Kurt Simpson is a 25 y.o. male with HIV infection diagnosed in October 2019.  History of OIs: none.  HIV Risk: MSM/bisexual.  CD4 nadir 310/VL 263,000.  YQM^[id number] (-), Quantiferon (-). Hep B sAg (-)  Previous Regimens: Dovato --> suppressed   Genotypes: 07/2018 - no mutations    Subjective:   Chief Complaint  Patient presents with   Follow-up    B20      HPI:  Kurt Simpson is here for follow up care.  Taking dovato everyday without any concern for missed doses. He got an approval letter to start cabenuva injections which he has with him today and would like to proceed with med switch.    No concern over anxious/depressed mood. Sleeping and eating well. Requested STI screening tests today. No active symptoms.     Review of Systems  Constitutional:  Negative for chills and fever.  HENT:  Negative for sore throat.   Eyes:  Negative for visual disturbance.  Gastrointestinal:  Negative for abdominal pain, anal bleeding and rectal pain.  Genitourinary:  Negative for dysuria, genital sores, penile discharge, penile pain, scrotal swelling and testicular pain.  Musculoskeletal:  Negative for arthralgias and joint swelling.  Skin:  Negative for rash.  Neurological:  Negative for headaches.  Hematological:  Negative for adenopathy.    Past Medical History:  Diagnosis Date   Allergy    Enlarged tonsils 04/25/2016   HIV infection ([place])    Mononucleosis    Palpitations    Vitamin D deficiency     Outpatient Medications Prior to Visit  Medication Sig Dispense Refill   dolutegravir-lamiVUDine (DOVATO) 50-300 MG tablet TAKE 1 TABLET BY MOUTH DAILY. 30 tablet 2   No facility-administered medications prior to visit.    No Known Allergies  Social History   Tobacco Use   Smoking status: Never   Smokeless tobacco: Never  Vaping Use   Vaping Use: Never used  Substance  Use Topics   Alcohol use: Yes    Alcohol/week: 0.0 standard drinks    Comment: occ   Drug use: No    Social History   Substance and Sexual Activity  Sexual Activity Yes   Partners: Male   Birth control/protection: Condom   Comment: Decline condoms     Objective:   Vitals:   09/18/21 1407  BP: 114/71  Pulse: (!) 48  Temp: 98.3 F (36.8 C)  TempSrc: Oral  Weight: 138 lb (62.6 kg)   Body mass index is 20.38 kg/m.  Physical Exam Constitutional:      Appearance: Normal appearance. He is not ill-appearing.  HENT:     Head: Normocephalic.     Mouth/Throat:     Mouth: Mucous membranes are moist.     Pharynx: Oropharynx is clear.  Eyes:     General: No scleral icterus. Pulmonary:     Effort: Pulmonary effort is normal.  Musculoskeletal:        General: Normal range of motion.     Cervical back: Normal range of motion.  Skin:    Coloration: Skin is not jaundiced or pale.  Neurological:     Mental Status: He is alert and oriented to person, place, and time.  Psychiatric:        Mood and Affect: Mood normal.        Judgment: Judgment normal.     Lab Results Lab Results  Component Value Date   WBC 3.5 (L) 05/02/2021   HGB 14.7 05/02/2021   HCT 45.5 05/02/2021   MCV 84.9 05/02/2021   PLT 256 05/02/2021    Lab Results  Component Value Date   CREATININE 0.97 05/02/2021   BUN 10 05/02/2021   NA 139 05/02/2021   K 4.3 05/02/2021   CL 104 05/02/2021   CO2 28 05/02/2021    Lab Results  Component Value Date   ALT 11 05/02/2021   AST 15 05/02/2021   ALKPHOS 67 05/10/2018   BILITOT 0.4 05/02/2021    Lab Results  Component Value Date   CHOL 141 05/02/2021   HDL 53 05/02/2021   LDLCALC 75 05/02/2021   TRIG 51 05/02/2021   CHOLHDL 2.7 05/02/2021   HIV 1 RNA Quant (Copies/mL)  Date Value  06/07/2021 Not Detected  03/12/2021 22 (H)  11/27/2020 45 (H)   CD4 T Cell Abs (/uL)  Date Value  06/07/2021 513  11/27/2020 415  07/21/2020 555      Assessment & Plan:   Problem List Items Addressed This Visit       Unprioritized   Asymptomatic HIV infection ([place]) - Primary (Chronic)    Counseled re: switching to LA injectable cabenuva. Patient assistance approved - will start loading dose 600/900 mg IM Friday. He will continue Dovato until then. He is interested in the q37mdosing schedule. Will plan for this and VL monitoring Q282m       Screening for STDs (sexually transmitted diseases)    Screen today with GC/C urine/oral/rectal and RPR.  No active symptoms or known exposures.       Relevant Orders   RPR (Completed)   Cytology (oral, anal, urethral) ancillary only (Completed)   Cytology (oral, anal, urethral) ancillary only (Completed)   Urine cytology ancillary only (Completed)    StJanene MadeiraMSN, NP-C [place] Co[facility] [email or web address] Pager: [phone]: [phone] 09/24/21  1:32 PM

## 2021-10-04 ENCOUNTER — Other Ambulatory Visit (HOSPITAL_COMMUNITY): Payer: Self-pay

## 2021-10-11 ENCOUNTER — Other Ambulatory Visit (HOSPITAL_COMMUNITY): Payer: Self-pay

## 2021-10-12 ENCOUNTER — Telehealth: Payer: Self-pay

## 2021-10-12 NOTE — Telephone Encounter (Signed)
[facility] Patient Advocate Encounter  Patient's medication Renaldo Harrison) have been couriered to [facility] from [facility] [employer] [facility] and will be administered on patient next office visit on 10/17/21.  [name] , CPhT Specialty Pharmacy Patient [facility] Phone: [phone] Fax:  [phone]

## 2021-10-17 ENCOUNTER — Other Ambulatory Visit: Payer: Self-pay

## 2021-10-17 ENCOUNTER — Ambulatory Visit (INDEPENDENT_AMBULATORY_CARE_PROVIDER_SITE_OTHER): Payer: 59 | Admitting: Pharmacist

## 2021-10-17 ENCOUNTER — Other Ambulatory Visit (HOSPITAL_COMMUNITY)
Admission: RE | Admit: 2021-10-17 | Discharge: 2021-10-17 | Disposition: A | Discharge status: Home / Self Care | Payer: 59 | Source: Ambulatory Visit | Attending: Infectious Diseases | Admitting: Infectious Diseases

## 2021-10-17 DIAGNOSIS — B20 Human immunodeficiency virus [HIV] disease: Secondary | ICD-10-CM | POA: Diagnosis present

## 2021-10-17 MED ORDER — CABOTEGRAVIR & RILPIVIRINE ER 600 & 900 MG/3ML IM SUER
1.0000 | Freq: Once | INTRAMUSCULAR | Status: AC
  Administered 2021-10-17: 1 via INTRAMUSCULAR

## 2021-10-17 NOTE — Progress Notes (Signed)
HPI: Kurt Simpson is a 26 y.o. male who presents to the [facility] for [place] administration.  Patient Active Problem List   Diagnosis Date Noted   Vaccine counseling 06/07/2021   Screening for STDs (sexually transmitted diseases) 03/29/2019   Asymptomatic HIV infection ([place]) 07/30/2018   Vitamin D deficiency 11/25/2009   Allergic rhinitis 01/17/2009    Patient's Medications  New Prescriptions   No medications on file  Previous Medications   CABOTEGRAVIR & RILPIVIRINE ER (CABENUVA) 600 & 900 MG/3ML INJECTION    Inject 1 kit into the muscle every 2 (two) months.   DOLUTEGRAVIR-LAMIVUDINE (DOVATO) 50-300 MG TABLET    TAKE 1 TABLET BY MOUTH DAILY.  Modified Medications   No medications on file  Discontinued Medications   No medications on file    Allergies: No Known Allergies  Past Medical History: Past Medical History:  Diagnosis Date   Allergy    Enlarged tonsils 04/25/2016   HIV infection ([place])    Mononucleosis    Palpitations    Vitamin D deficiency     Social History: Social History   Socioeconomic History   Marital status: Single    Spouse name: Not on file   Number of children: 0   Years of education: Not on file   Highest education level: Not on file  Occupational History   Not on file  Tobacco Use   Smoking status: Never   Smokeless tobacco: Never  Vaping Use   Vaping Use: Never used  Substance and Sexual Activity   Alcohol use: Yes    Alcohol/week: 0.0 standard drinks    Comment: occ   Drug use: No   Sexual activity: Yes    Partners: Male    Birth control/protection: Condom    Comment: Decline condoms  Other Topics Concern   Not on file  Social History Narrative   He lives with his mother, step-dad , younger sister.    He was taking classes a [employer], but has taken some time off.    Social Determinants of Health   Financial Resource Strain: Low Risk    Difficulty of Paying Living Expenses: Not hard at all  Food Insecurity:  No Food Insecurity   Worried About Charity fundraiser in the Last Year: Never true   [place] in the Last Year: Never true  Transportation Needs: No Transportation Needs   Lack of Transportation (Medical): No   Lack of Transportation (Non-Medical): No  Physical Activity: Sufficiently Active   Days of Exercise per Week: 5 days   Minutes of Exercise per Session: 80 min  Stress: Stress Concern Present   Feeling of Stress : To some extent  Social Connections: Moderately Integrated   Frequency of Communication with Friends and Family: Three times a week   Frequency of Social Gatherings with Friends and Family: Twice a week   Attends Religious Services: More than 4 times per year   Active Member of Clubs or Organizations: Yes   Attends Archivist Meetings: 1 to 4 times per year   Marital Status: Never married    Labs: Lab Results  Component Value Date   HIV1RNAQUANT Not Detected 06/07/2021   HIV1RNAQUANT 22 (H) 03/12/2021   HIV1RNAQUANT 45 (H) 11/27/2020   CD4TABS 513 06/07/2021   CD4TABS 415 11/27/2020   CD4TABS 555 07/21/2020    RPR and STI Lab Results  Component Value Date   LABRPR NON-REACTIVE 09/18/2021   LABRPR NON-REACTIVE 06/07/2021  LABRPR NON-REACTIVE 05/02/2021   LABRPR NON-REACTIVE 03/12/2021   LABRPR NON-REACTIVE 07/21/2020    STI Results GC CT  09/18/2021 Negative Negative  09/18/2021 Negative Negative  09/18/2021 Negative Negative  06/07/2021 Negative Negative  06/07/2021 Negative Negative  06/07/2021 Negative Negative  05/02/2021 Negative Negative  05/02/2021 Negative Negative  05/02/2021 Negative Negative  03/12/2021 Negative Negative  03/12/2021 Negative Negative  03/12/2021 Negative Negative  12/12/2020 Negative Negative  12/12/2020 Negative Negative  12/12/2020 Negative Negative  08/03/2020 Positive(A) Negative  08/03/2020 Negative Positive(A)  07/21/2020 Negative Negative  05/09/2020 Negative Positive(A)  05/09/2020 Negative Negative     Hepatitis B Lab Results  Component Value Date   HEPBSAB REACTIVE (A) 03/29/2019   HEPBSAG NON-REACTIVE 07/15/2018   HEPBCAB NON-REACTIVE 07/15/2018   Hepatitis C Lab Results  Component Value Date   HEPCAB NON-REACTIVE 07/15/2018   Hepatitis A Lab Results  Component Value Date   HAV REACTIVE (A) 07/15/2018   Lipids: Lab Results  Component Value Date   CHOL 141 05/02/2021   TRIG 51 05/02/2021   HDL 53 05/02/2021   CHOLHDL 2.7 05/02/2021   LDLCALC 75 05/02/2021    TARGET DATE:  The 16th of the month  Current HIV Regimen: Cabenuva 600/964m IM q211month Assessment: Mr. RoBerkovichresents today for their maintenance Cabenuva injections. Initial injections were tolerated well with mild injection site pain for only a couple of days, managed well with Tylenol. No problems with systemic effects of injections.   Administered cabotegravir 60043mmL in left upper outer quadrant of the gluteal muscle. Administered rilpivirine 900 mg/3mL7m the right upper outer quadrant of the gluteal muscle. Monitored patient for 10 minutes after injection. Injections were tolerated well without issue. Patient will follow up in 2 months for next injection.  Patient requests STI testing today in addition to HIV viral load.   Plan: - Cabenuva injections administered - Next injections scheduled for 12/18/2021 with StepJanene Madeira - HIV RNA lab collected today - STI screening: oral, rectal, and urine cytologies collected today - Call with any issues or questions  KinsDonald PorearmD Pharmacy Resident 10/17/2021, 10:40 AM

## 2021-10-18 LAB — URINE CYTOLOGY ANCILLARY ONLY
Chlamydia: NEGATIVE
Comment: NEGATIVE
Comment: NORMAL
Neisseria Gonorrhea: NEGATIVE

## 2021-10-18 LAB — CYTOLOGY, (ORAL, ANAL, URETHRAL) ANCILLARY ONLY
Chlamydia: NEGATIVE
Chlamydia: NEGATIVE
Comment: NEGATIVE
Comment: NEGATIVE
Comment: NORMAL
Comment: NORMAL
Neisseria Gonorrhea: NEGATIVE
Neisseria Gonorrhea: NEGATIVE

## 2021-10-20 LAB — HIV-1 RNA QUANT-NO REFLEX-BLD
HIV 1 RNA Quant: NOT DETECTED {copies}/mL
HIV-1 RNA Quant, Log: NOT DETECTED {Log_copies}/mL

## 2021-11-09 ENCOUNTER — Encounter: Payer: Self-pay | Admitting: Family Medicine

## 2021-11-30 ENCOUNTER — Other Ambulatory Visit (HOSPITAL_COMMUNITY): Payer: Self-pay

## 2021-12-03 ENCOUNTER — Other Ambulatory Visit (HOSPITAL_COMMUNITY): Payer: Self-pay

## 2021-12-04 ENCOUNTER — Telehealth: Payer: Self-pay

## 2021-12-04 NOTE — Telephone Encounter (Signed)
[facility] Patient Advocate Encounter  Patient's medication Kurt Simpson) have been couriered to [facility] from [facility] and will be administered on patient next office visit on 12/18/21.  [name] , CPhT Specialty Pharmacy Patient [facility] for Infectious Disease Phone: [phone] Fax:  [phone]

## 2021-12-05 ENCOUNTER — Other Ambulatory Visit: Payer: Self-pay

## 2021-12-13 ENCOUNTER — Other Ambulatory Visit (HOSPITAL_COMMUNITY): Payer: Self-pay

## 2021-12-18 ENCOUNTER — Other Ambulatory Visit: Payer: Self-pay

## 2021-12-18 ENCOUNTER — Ambulatory Visit (INDEPENDENT_AMBULATORY_CARE_PROVIDER_SITE_OTHER): Payer: 59 | Admitting: Infectious Diseases

## 2021-12-18 VITALS — BP 114/68 | HR 73 | Resp 16 | Ht 69.0 in | Wt 141.2 lb

## 2021-12-18 DIAGNOSIS — B20 Human immunodeficiency virus [HIV] disease: Secondary | ICD-10-CM | POA: Diagnosis not present

## 2021-12-18 DIAGNOSIS — Z21 Asymptomatic human immunodeficiency virus [HIV] infection status: Secondary | ICD-10-CM | POA: Diagnosis not present

## 2021-12-18 DIAGNOSIS — Z113 Encounter for screening for infections with a predominantly sexual mode of transmission: Secondary | ICD-10-CM | POA: Diagnosis not present

## 2021-12-18 MED ORDER — CABOTEGRAVIR & RILPIVIRINE ER 600 & 900 MG/3ML IM SUER
1.0000 | Freq: Once | INTRAMUSCULAR | Status: AC
  Administered 2021-12-18: 1 via INTRAMUSCULAR

## 2021-12-18 NOTE — Assessment & Plan Note (Signed)
Screen today with urine and extragenital site GC/CT. RPR in blood  ?

## 2021-12-18 NOTE — Progress Notes (Signed)
? ?Name: Kurt Simpson  ?DOB: March 17, 1996 ?MRN: [id number] ?PCP: Steele Sizer, MD  ? ? ?Brief Narrative:  ?Kurt Simpson is a 26 y.o. male with HIV infection diagnosed in October 2019.  ?History of OIs: none.  ?HIV Risk: MSM/bisexual.  ?CD4 nadir 310/VL 263,000.  ?CLE^X5170 (-), Quantiferon (-). Hep B sAg (-) ? ?Previous Regimens: ?Dovato --> suppressed  ? ?Genotypes: ?07/2018 - no mutations  ? ? ?Subjective:  ? ?No chief complaint on file. ?  ? ? ?HPI:  ?Kurt Simpson is here for 77mfollow up and Cabenuva maintenance injection today.  ?Loading dose given in December with first maintenance dose in January 2023. Target date is the 11th. Very mild injection site reactions. Takes tylenol and moves around frequently to overcome this. He is a little nervous not taking a pill a day but hopeful all is working well.  ?  ?No concern over anxious/depressed mood. Sleeping and eating well. Requested STI screening tests today. No active symptoms.  ? ?  ?Review of Systems  ?Constitutional:  Negative for chills and fever.  ?HENT:  Negative for sore throat.   ?Eyes:  Negative for visual disturbance.  ?Gastrointestinal:  Negative for abdominal pain, anal bleeding and rectal pain.  ?Genitourinary:  Negative for dysuria, genital sores, penile discharge, penile pain, scrotal swelling and testicular pain.  ?Musculoskeletal:  Negative for arthralgias and joint swelling.  ?Skin:  Negative for rash.  ?Neurological:  Negative for headaches.  ?Hematological:  Negative for adenopathy.  ? ? ?Past Medical History:  ?Diagnosis Date  ? Allergy   ? Enlarged tonsils 04/25/2016  ? HIV infection ([place]   ? Mononucleosis   ? Palpitations   ? Vitamin D deficiency   ? ? ?Outpatient Medications Prior to Visit  ?Medication Sig Dispense Refill  ? cabotegravir & rilpivirine ER (CABENUVA) 600 & 900 MG/3ML injection Inject 1 kit into the muscle every 2 (two) months. 6 mL 5  ? ?No facility-administered medications prior to visit.  ?  ?No Known Allergies ? ?Social  History  ? ?Tobacco Use  ? Smoking status: Never  ? Smokeless tobacco: Never  ?Vaping Use  ? Vaping Use: Never used  ?Substance Use Topics  ? Alcohol use: Yes  ?  Alcohol/week: 0.0 standard drinks  ?  Comment: occ  ? Drug use: No  ? ? ?Social History  ? ?Substance and Sexual Activity  ?Sexual Activity Yes  ? Partners: Male  ? Birth control/protection: Condom  ? Comment: Decline condoms  ? ? ? ?Objective:  ? ?Vitals:  ? 12/18/21 0859  ?BP: 114/68  ?Pulse: 73  ?Resp: 16  ?SpO2: 100%  ?Weight: 141 lb 3.2 oz (64 kg)  ?Height: '5\' 9"'  (1.753 m)  ? ?Body mass index is 20.85 kg/m?. ? ?Physical Exam ?Constitutional:   ?   Appearance: Normal appearance. He is not ill-appearing.  ?HENT:  ?   Head: Normocephalic.  ?   Mouth/Throat:  ?   Mouth: Mucous membranes are moist.  ?   Pharynx: Oropharynx is clear.  ?Eyes:  ?   General: No scleral icterus. ?Pulmonary:  ?   Effort: Pulmonary effort is normal.  ?Musculoskeletal:     ?   General: Normal range of motion.  ?   Cervical back: Normal range of motion.  ?Skin: ?   Coloration: Skin is not jaundiced or pale.  ?Neurological:  ?   Mental Status: He is alert and oriented to person, place, and time.  ?Psychiatric:     ?  Mood and Affect: Mood normal.     ?   Judgment: Judgment normal.  ? ? ? ?Lab Results ?Lab Results  ?Component Value Date  ? WBC 3.5 (L) 05/02/2021  ? HGB 14.7 05/02/2021  ? HCT 45.5 05/02/2021  ? MCV 84.9 05/02/2021  ? PLT 256 05/02/2021  ?  ?Lab Results  ?Component Value Date  ? CREATININE 0.97 05/02/2021  ? BUN 10 05/02/2021  ? NA 139 05/02/2021  ? K 4.3 05/02/2021  ? CL 104 05/02/2021  ? CO2 28 05/02/2021  ?  ?Lab Results  ?Component Value Date  ? ALT 11 05/02/2021  ? AST 15 05/02/2021  ? ALKPHOS 67 05/10/2018  ? BILITOT 0.4 05/02/2021  ?  ?Lab Results  ?Component Value Date  ? CHOL 141 05/02/2021  ? HDL 53 05/02/2021  ? [place] 75 05/02/2021  ? TRIG 51 05/02/2021  ? CHOLHDL 2.7 05/02/2021  ? ?HIV 1 RNA Quant (Copies/mL)  ?Date Value  ?10/17/2021 Not Detected   ?06/07/2021 Not Detected  ?03/12/2021 22 (H)  ? ?CD4 T Cell Abs (/uL)  ?Date Value  ?06/07/2021 513  ?11/27/2020 415  ?07/21/2020 555  ? ? ? ?Assessment & Plan:  ? ?Problem List Items Addressed This Visit   ? ?  ? Unprioritized  ? Asymptomatic HIV infection ([place]) (Chronic)  ?  Doing well on every 8 week injections of Cabenuva. Tolerating well with only minor injection site reactions. All injections have been administered at appropriate treatment interval.  ?Will continue with Q81mviral load monitoring during first 6 - 8 months for therapeutic treatment monitoring.  ? ?Last VL result:  ?HIV 1 RNA Quant (Copies/mL)  ?Date Value  ?10/17/2021 Not Detected  ? ?Dose Interval: Q294mTarget Date: 11th ?Next Appointment: 02/13/2022 ? ?Will continue to follow with pharmacy team for injections an I will plan to see him back for routine follow up again in September or November injection dose.  ?  ?  ? Routine screening for STI (sexually transmitted infection)  ?  Screen today with urine and extragenital site GC/CT. RPR in blood  ?  ?  ? ?Other Visit Diagnoses   ? ? HIV disease (HCFarnam   -  Primary  ? Relevant Orders  ? HIV 1 RNA quant-no reflex-bld  ? ?  ? ?StJanene MadeiraMSN, NP-C ?[facility] ?[facility]  ?[email or web address] ?Pager: [phone]: [phone] Main Line: [phone]? ?12/18/21  ?9:23 AM ? ? ?

## 2021-12-18 NOTE — Assessment & Plan Note (Addendum)
Doing well on every 8 week injections of Cabenuva. Tolerating well with only minor injection site reactions. All injections have been administered at appropriate treatment interval.  ?Will continue with Q14m viral load monitoring during first 6 - 8 months for therapeutic treatment monitoring.  ? ?Last VL result:  ?HIV 1 RNA Quant (Copies/mL)  ?Date Value  ?10/17/2021 Not Detected  ? ? ?Dose Interval: Q67m ?Target Date: 11th ?Next Appointment: 02/13/2022 ? ?Will continue to follow with pharmacy team for injections an I will plan to see him back for routine follow up again in September or November injection dose.  ?

## 2021-12-18 NOTE — Patient Instructions (Signed)
We gave you your injection of CABENUVA for treatment today.  ? ?Your next appointment has been scheduled in 2 month on 02/13/2022 with our pharmacy team.  ? ?Target Date for Injections - around the 11th day of the odd months.  ? ?I will plan on seeing you back to check in again at your September or November injection visit.  ? ?It is very important to keep your appointments for treatment scheduled in a specific range to ensure that the medication works well for you. For this reason you will have appointments set out a few months ahead of time to keep you on track.  ? ? ? ? ?Helpful Tips:  ?If you experience any knots under the skin please use a warm compress to help.  Hot baths or showers can also help.  ? ?Do not massage the muscles where you received your injection.  ? ?Some people experience some redness at the site of the injection. This can be normal and should go away soon.  ? ?Moving around today is a good idea, if you sit too much it hurts more.  ? ?Please call the office to speak with our pharmacy or triage team if you have any questions or concerns.  ? ?

## 2021-12-18 NOTE — Addendum Note (Signed)
Addended by: Clayborne Artist A on: 12/18/2021 10:23 AM ? ? Modules accepted: Orders ? ?

## 2021-12-19 LAB — C. TRACHOMATIS/N. GONORRHOEAE RNA
C. trachomatis RNA, TMA: NOT DETECTED
N. gonorrhoeae RNA, TMA: NOT DETECTED

## 2021-12-21 LAB — RPR: RPR Ser Ql: NONREACTIVE

## 2021-12-21 LAB — HIV-1 RNA QUANT-NO REFLEX-BLD
HIV 1 RNA Quant: NOT DETECTED Copies/mL
HIV-1 RNA Quant, Log: NOT DETECTED Log cps/mL

## 2021-12-21 LAB — GC/CHLAMYDIA PROBE, AMP (THROAT)
Chlamydia trachomatis RNA: NOT DETECTED
Neisseria gonorrhoeae RNA: NOT DETECTED

## 2021-12-21 LAB — CT/NG RNA, TMA RECTAL
Chlamydia Trachomatis RNA: NOT DETECTED
Neisseria Gonorrhoeae RNA: NOT DETECTED

## 2022-01-30 ENCOUNTER — Other Ambulatory Visit (HOSPITAL_COMMUNITY): Payer: Self-pay

## 2022-01-31 ENCOUNTER — Other Ambulatory Visit (HOSPITAL_COMMUNITY): Payer: Self-pay

## 2022-02-01 ENCOUNTER — Telehealth: Payer: Self-pay

## 2022-02-01 NOTE — Telephone Encounter (Signed)
[facility] Patient Advocate Encounter ? ?Patient's medication Kurt Simpson) have been couriered to [facility] from [facility] and will be administered on the patient next office visit on 02/13/22. ? ?[name] , CPhT ?Specialty Pharmacy Patient Advocate ?[facility] ?Phone: [phone] ?Fax:  [phone]  ?

## 2022-02-13 ENCOUNTER — Ambulatory Visit (INDEPENDENT_AMBULATORY_CARE_PROVIDER_SITE_OTHER): Payer: 59 | Admitting: Pharmacist

## 2022-02-13 ENCOUNTER — Other Ambulatory Visit: Payer: Self-pay

## 2022-02-13 DIAGNOSIS — B20 Human immunodeficiency virus [HIV] disease: Secondary | ICD-10-CM

## 2022-02-13 MED ORDER — CABOTEGRAVIR & RILPIVIRINE ER 600 & 900 MG/3ML IM SUER
1.0000 | Freq: Once | INTRAMUSCULAR | Status: AC
  Administered 2022-02-13: 1 via INTRAMUSCULAR

## 2022-02-13 NOTE — Progress Notes (Signed)
? ?HPI: Kurt Simpson is a 26 y.o. male who presents to the [facility] for [place] administration. ? ?Patient Active Problem List  ? Diagnosis Date Noted  ? Vaccine counseling 06/07/2021  ? Routine screening for STI (sexually transmitted infection) 03/29/2019  ? Asymptomatic HIV infection ([place]) 07/30/2018  ? Vitamin D deficiency 11/25/2009  ? Allergic rhinitis 01/17/2009  ? ? ?Patient's Medications  ?New Prescriptions  ? No medications on file  ?Previous Medications  ? CABOTEGRAVIR & RILPIVIRINE ER (CABENUVA) 600 & 900 MG/3ML INJECTION    Inject 1 kit into the muscle every 2 (two) months.  ?Modified Medications  ? No medications on file  ?Discontinued Medications  ? No medications on file  ? ? ?Allergies: ?No Known Allergies ? ?Past Medical History: ?Past Medical History:  ?Diagnosis Date  ? Allergy   ? Enlarged tonsils 04/25/2016  ? HIV infection ([place])   ? Mononucleosis   ? Palpitations   ? Vitamin D deficiency   ? ? ?Social History: ?Social History  ? ?Socioeconomic History  ? Marital status: Single  ?  Spouse name: Not on file  ? Number of children: 0  ? Years of education: Not on file  ? Highest education level: Not on file  ?Occupational History  ? Not on file  ?Tobacco Use  ? Smoking status: Never  ? Smokeless tobacco: Never  ?Vaping Use  ? Vaping Use: Never used  ?Substance and Sexual Activity  ? Alcohol use: Yes  ?  Alcohol/week: 0.0 standard drinks  ?  Comment: occ  ? Drug use: No  ? Sexual activity: Yes  ?  Partners: Male  ?  Birth control/protection: Condom  ?  Comment: Decline condoms  ?Other Topics Concern  ? Not on file  ?Social History Narrative  ? He lives with his mother, step-dad , younger sister.   ? He was taking classes a [employer], but has taken some time off.   ? ?Social Determinants of Health  ? ?Financial Resource Strain: Low Risk   ? Difficulty of Paying Living Expenses: Not hard at all  ?Food Insecurity: No Food Insecurity  ? Worried About Charity fundraiser in the Last Year: Never  true  ? Ran Out of Food in the Last Year: Never true  ?Transportation Needs: No Transportation Needs  ? Lack of Transportation (Medical): No  ? Lack of Transportation (Non-Medical): No  ?Physical Activity: Sufficiently Active  ? Days of Exercise per Week: 5 days  ? Minutes of Exercise per Session: 80 min  ?Stress: Stress Concern Present  ? Feeling of Stress : To some extent  ?Social Connections: Moderately Integrated  ? Frequency of Communication with Friends and Family: Three times a week  ? Frequency of Social Gatherings with Friends and Family: Twice a week  ? Attends Religious Services: More than 4 times per year  ? Active Member of Clubs or Organizations: Yes  ? Attends Archivist Meetings: 1 to 4 times per year  ? Marital Status: Never married  ? ? ?Labs: ?Lab Results  ?Component Value Date  ? HIV1RNAQUANT Not Detected 12/18/2021  ? HIV1RNAQUANT Not Detected 10/17/2021  ? HIV1RNAQUANT Not Detected 06/07/2021  ? CD4TABS 513 06/07/2021  ? CD4TABS 415 11/27/2020  ? CD4TABS 555 07/21/2020  ? ? ?RPR and STI ?Lab Results  ?Component Value Date  ? LABRPR NON-REACTIVE 12/18/2021  ? LABRPR NON-REACTIVE 09/18/2021  ? LABRPR NON-REACTIVE 06/07/2021  ? LABRPR NON-REACTIVE 05/02/2021  ? LABRPR NON-REACTIVE 03/12/2021  ? ? ?  STI Results GC CT  ?10/17/2021 ?10:22 AM Negative    ? Negative    ? Negative   Negative    ? Negative    ? Negative    ?09/18/2021 ? 2:34 PM Negative   Negative    ?09/18/2021 ? 2:33 PM Negative    ? Negative   Negative    ? Negative    ?06/07/2021 ? 3:12 PM Negative   Negative    ?06/07/2021 ? 3:11 PM Negative    ? Negative   Negative    ? Negative    ?05/02/2021 ?12:03 PM Negative    ? Negative   Negative    ? Negative    ?05/02/2021 ?11:48 AM Negative   Negative    ?03/12/2021 ?10:38 AM Negative    ? Negative   Negative    ? Negative    ?03/12/2021 ?10:37 AM Negative   Negative    ?12/12/2020 ?10:56 AM Negative    ? Negative    ? Negative   Negative    ? Negative    ? Negative    ?08/03/2020 ?11:05 AM  Positive   Negative    ?08/03/2020 ?11:04 AM Negative   Positive    ?07/21/2020 ?11:00 AM Negative   Negative    ?05/09/2020 ?11:04 AM Negative   Positive    ?05/09/2020 ?11:03 AM Negative   Negative    ?05/09/2020 ?11:01 AM Positive   Negative    ?09/23/2019 ? 7:30 AM Positive    ? Negative    ? Negative   Negative    ? Negative    ? Negative    ?04/15/2019 ?12:00 AM Negative    ? Negative   Negative    ? Negative    ?03/29/2019 ?12:00 AM Negative   Negative    ?07/15/2018 ?12:00 AM Negative   Negative    ? ? ?Hepatitis B ?Lab Results  ?Component Value Date  ? HEPBSAB REACTIVE (A) 03/29/2019  ? HEPBSAG NON-REACTIVE 07/15/2018  ? HEPBCAB NON-REACTIVE 07/15/2018  ? ?Hepatitis C ?Lab Results  ?Component Value Date  ? HEPCAB NON-REACTIVE 07/15/2018  ? ?Hepatitis A ?Lab Results  ?Component Value Date  ? HAV REACTIVE (A) 07/15/2018  ? ?Lipids: ?Lab Results  ?Component Value Date  ? CHOL 141 05/02/2021  ? TRIG 51 05/02/2021  ? HDL 53 05/02/2021  ? CHOLHDL 2.7 05/02/2021  ? [place] 75 05/02/2021  ? ? ?TARGET DATE: ?16th of the month ? ?Assessment: ?Kurt Simpson presents today for their maintenance Cabenuva injections. Past injections were tolerated well without issues. No problems with systemic side effects of injections. Patient did experience some soreness with the first shot which is resolved with walking and tylenol for the first day. STI testing and HIV RNA ordered for today. ? ?Administered cabotegravir 634m/3mL in left upper outer quadrant of the gluteal muscle. Administered rilpivirine 900 mg/324min the right upper outer quadrant of the gluteal muscle. Injections were tolerated well. Patient will follow up in 2 months for next set of injections. He prefers receiving the injections at the same time. ? ?Plan: ?- Cabenuva injections administered ?- Next injections scheduled for 7/12 with Dr. WoRogers Blocker- Call with any issues or questions ?- Follow up with lab results for HIV RNA and STI testing ? ?Kurt Simpson ?PGY1 Pharmacy  Resident ?[facility] ? ?

## 2022-02-14 LAB — CYTOLOGY, (ORAL, ANAL, URETHRAL) ANCILLARY ONLY
Chlamydia: NEGATIVE
Chlamydia: NEGATIVE
Comment: NEGATIVE
Comment: NEGATIVE
Comment: NORMAL
Comment: NORMAL
Neisseria Gonorrhea: NEGATIVE
Neisseria Gonorrhea: NEGATIVE

## 2022-02-14 LAB — URINE CYTOLOGY ANCILLARY ONLY
Chlamydia: NEGATIVE
Comment: NEGATIVE
Comment: NORMAL
Neisseria Gonorrhea: NEGATIVE

## 2022-02-16 LAB — HIV-1 RNA QUANT-NO REFLEX-BLD
HIV 1 RNA Quant: NOT DETECTED copies/mL
HIV-1 RNA Quant, Log: NOT DETECTED Log copies/mL

## 2022-04-02 ENCOUNTER — Other Ambulatory Visit (HOSPITAL_COMMUNITY): Payer: Self-pay

## 2022-04-03 ENCOUNTER — Other Ambulatory Visit (HOSPITAL_COMMUNITY): Payer: Self-pay

## 2022-04-04 ENCOUNTER — Telehealth: Payer: Self-pay

## 2022-04-04 NOTE — Telephone Encounter (Signed)
[facility] Patient Advocate Encounter  Patient's medication Renaldo Harrison) have been couriered to [facility] from [facility] pharmacy and will be administered on the patient next office visit on 04/17/22.  [name] , CPhT Specialty Pharmacy Patient [facility] for Infect[facility] Phone: [phone] Fax:  [phone]

## 2022-04-17 ENCOUNTER — Other Ambulatory Visit (HOSPITAL_COMMUNITY)
Admission: RE | Admit: 2022-04-17 | Discharge: 2022-04-17 | Disposition: A | Discharge status: Home / Self Care | Payer: 59 | Source: Ambulatory Visit | Attending: Infectious Diseases | Admitting: Infectious Diseases

## 2022-04-17 ENCOUNTER — Other Ambulatory Visit: Payer: Self-pay

## 2022-04-17 ENCOUNTER — Ambulatory Visit (INDEPENDENT_AMBULATORY_CARE_PROVIDER_SITE_OTHER): Payer: 59 | Admitting: Pharmacist

## 2022-04-17 DIAGNOSIS — B20 Human immunodeficiency virus [HIV] disease: Secondary | ICD-10-CM | POA: Diagnosis present

## 2022-04-17 MED ORDER — CABOTEGRAVIR & RILPIVIRINE ER 600 & 900 MG/3ML IM SUER
1.0000 | Freq: Once | INTRAMUSCULAR | Status: AC
  Administered 2022-04-17: 1 via INTRAMUSCULAR

## 2022-04-17 NOTE — Progress Notes (Signed)
HPI: Kurt Simpson is a 26 y.o. male who presents to the [facility] for [place] administration.  Patient Active Problem List   Diagnosis Date Noted   Vaccine counseling 06/07/2021   Routine screening for STI (sexually transmitted infection) 03/29/2019   Asymptomatic HIV infection ([place]) 07/30/2018   Vitamin D deficiency 11/25/2009   Allergic rhinitis 01/17/2009    Patient's Medications  New Prescriptions   No medications on file  Previous Medications   CABOTEGRAVIR & RILPIVIRINE ER (CABENUVA) 600 & 900 MG/3ML INJECTION    Inject 1 kit into the muscle every 2 (two) months.  Modified Medications   No medications on file  Discontinued Medications   No medications on file    Allergies: No Known Allergies  Past Medical History: Past Medical History:  Diagnosis Date   Allergy    Enlarged tonsils 04/25/2016   HIV infection ([place])    Mononucleosis    Palpitations    Vitamin D deficiency     Social History: Social History   Socioeconomic History   Marital status: Single    Spouse name: Not on file   Number of children: 0   Years of education: Not on file   Highest education level: Not on file  Occupational History   Not on file  Tobacco Use   Smoking status: Never   Smokeless tobacco: Never  Vaping Use   Vaping Use: Never used  Substance and Sexual Activity   Alcohol use: Yes    Alcohol/week: 0.0 standard drinks of alcohol    Comment: occ   Drug use: No   Sexual activity: Yes    Partners: Male    Birth control/protection: Condom    Comment: Decline condoms  Other Topics Concern   Not on file  Social History Narrative   He lives with his mother, step-dad , younger sister.    He was taking classes a [employer], but has taken some time off.    Social Determinants of Health   Financial Resource Strain: Low Risk  (05/02/2021)   Overall Financial Resource Strain (CARDIA)    Difficulty of Paying Living Expenses: Not hard at all  Food Insecurity: No Food  Insecurity (05/02/2021)   Hunger Vital Sign    Worried About Running Out of Food in the Last Year: Never true    Ran Out of Food in the Last Year: Never true  Transportation Needs: No Transportation Needs (05/02/2021)   PRAPARE - Hydrologist (Medical): No    Lack of Transportation (Non-Medical): No  Physical Activity: Sufficiently Active (05/02/2021)   Exercise Vital Sign    Days of Exercise per Week: 5 days    Minutes of Exercise per Session: 80 min  Stress: Stress Concern Present (05/02/2021)   [place]    Feeling of Stress : To some extent  Social Connections: Moderately Integrated (05/02/2021)   Social Connection and Isolation Panel [NHANES]    Frequency of Communication with Friends and Family: Three times a week    Frequency of Social Gatherings with Friends and Family: Twice a week    Attends Religious Services: More than 4 times per year    Active Member of [employer] or Organizations: Yes    Attends Archivist Meetings: 1 to 4 times per year    Marital Status: Never married    Labs: Lab Results  Component Value Date   HIV1RNAQUANT NOT DETECTED 02/13/2022  HIV1RNAQUANT Not Detected 12/18/2021   HIV1RNAQUANT Not Detected 10/17/2021   CD4TABS 513 06/07/2021   CD4TABS 415 11/27/2020   CD4TABS 555 07/21/2020    RPR and STI Lab Results  Component Value Date   LABRPR NON-REACTIVE 12/18/2021   LABRPR NON-REACTIVE 09/18/2021   LABRPR NON-REACTIVE 06/07/2021   LABRPR NON-REACTIVE 05/02/2021   LABRPR NON-REACTIVE 03/12/2021    STI Results GC CT  02/13/2022  9:38 AM Negative    Negative    Negative  Negative    Negative    Negative   10/17/2021 10:22 AM Negative    Negative    Negative  Negative    Negative    Negative   09/18/2021  2:34 PM Negative  Negative   09/18/2021  2:33 PM Negative    Negative  Negative    Negative   06/07/2021  3:12 PM Negative   Negative   06/07/2021  3:11 PM Negative    Negative  Negative    Negative   05/02/2021 12:03 PM Negative    Negative  Negative    Negative   05/02/2021 11:48 AM Negative  Negative   03/12/2021 10:38 AM Negative    Negative  Negative    Negative   03/12/2021 10:37 AM Negative  Negative   12/12/2020 10:56 AM Negative    Negative    Negative  Negative    Negative    Negative   08/03/2020 11:05 AM Positive  Negative   08/03/2020 11:04 AM Negative  Positive   07/21/2020 11:00 AM Negative  Negative   05/09/2020 11:04 AM Negative  Positive   05/09/2020 11:03 AM Negative  Negative   05/09/2020 11:01 AM Positive  Negative   09/23/2019  7:30 AM Positive    Negative    Negative  Negative    Negative    Negative   04/15/2019 12:00 AM Negative    Negative  Negative    Negative   03/29/2019 12:00 AM Negative  Negative     Hepatitis B Lab Results  Component Value Date   HEPBSAB REACTIVE (A) 03/29/2019   HEPBSAG NON-REACTIVE 07/15/2018   HEPBCAB NON-REACTIVE 07/15/2018   Hepatitis C Lab Results  Component Value Date   HEPCAB NON-REACTIVE 07/15/2018   Hepatitis A Lab Results  Component Value Date   HAV REACTIVE (A) 07/15/2018   Lipids: Lab Results  Component Value Date   CHOL 141 05/02/2021   TRIG 51 05/02/2021   HDL 53 05/02/2021   CHOLHDL 2.7 05/02/2021   LDLCALC 75 05/02/2021    TARGET DATE:  The 16th of the month  Current HIV Regimen: Cabenuva  Assessment: [place] presents today for their maintenance Cabenuva injections. Initial/past injections were tolerated well without issues. No problems with systemic effects of injections. Administered cabotegravir 645m/3mL in left upper outer quadrant of the gluteal muscle. Administered rilpivirine 900 mg/343min the right upper outer quadrant of the gluteal muscle. Monitored patient for 10 minutes after injection. Injections were tolerated well without issue. Patient will follow up in 2 months for next injection.  Will check  HIV RNA today. Patient requested full STI screening today. Will check RPR and urine/oral/rectal cytologies. Declined condoms today. Also politely declined Jynneos vaccine today. Will be due for Menveo booster this fall.   Plan: - Cabenuva injections administered - Next injections scheduled for 9/18 with [place] 11/14 with me  - Call with any issues or questions  AmAlfonse SprucePharmD, CPP Clinical Pharmacist Practitioner Infectious Diseases [facility]

## 2022-04-18 LAB — CYTOLOGY, (ORAL, ANAL, URETHRAL) ANCILLARY ONLY
Chlamydia: NEGATIVE
Chlamydia: NEGATIVE
Comment: NEGATIVE
Comment: NEGATIVE
Comment: NORMAL
Comment: NORMAL
Neisseria Gonorrhea: NEGATIVE
Neisseria Gonorrhea: NEGATIVE

## 2022-04-18 LAB — URINE CYTOLOGY ANCILLARY ONLY
Chlamydia: NEGATIVE
Comment: NEGATIVE
Comment: NORMAL
Neisseria Gonorrhea: NEGATIVE

## 2022-04-21 ENCOUNTER — Other Ambulatory Visit: Payer: Self-pay

## 2022-04-21 ENCOUNTER — Encounter (HOSPITAL_BASED_OUTPATIENT_CLINIC_OR_DEPARTMENT_OTHER): Payer: Self-pay | Admitting: Emergency Medicine

## 2022-04-21 DIAGNOSIS — R509 Fever, unspecified: Secondary | ICD-10-CM | POA: Diagnosis present

## 2022-04-21 DIAGNOSIS — Z21 Asymptomatic human immunodeficiency virus [HIV] infection status: Secondary | ICD-10-CM | POA: Diagnosis not present

## 2022-04-21 DIAGNOSIS — J020 Streptococcal pharyngitis: Secondary | ICD-10-CM | POA: Insufficient documentation

## 2022-04-21 LAB — RPR: RPR Ser Ql: NONREACTIVE

## 2022-04-21 LAB — HIV-1 RNA QUANT-NO REFLEX-BLD
HIV 1 RNA Quant: NOT DETECTED Copies/mL
HIV-1 RNA Quant, Log: NOT DETECTED Log cps/mL

## 2022-04-21 NOTE — ED Triage Notes (Signed)
Fever x 3 days, headache started yesterday, today noticee bumps on the right side of tongue and cheek. Pt states it feels like his tongue and right side of body are swelling.

## 2022-04-22 ENCOUNTER — Emergency Department (HOSPITAL_BASED_OUTPATIENT_CLINIC_OR_DEPARTMENT_OTHER)
Admission: EM | Admit: 2022-04-22 | Discharge: 2022-04-22 | Disposition: A | Discharge status: Home / Self Care | Payer: 59 | Attending: Emergency Medicine | Admitting: Emergency Medicine

## 2022-04-22 ENCOUNTER — Other Ambulatory Visit: Payer: Self-pay

## 2022-04-22 ENCOUNTER — Encounter: Payer: Self-pay | Admitting: Nurse Practitioner

## 2022-04-22 ENCOUNTER — Telehealth (INDEPENDENT_AMBULATORY_CARE_PROVIDER_SITE_OTHER): Payer: 59 | Admitting: Nurse Practitioner

## 2022-04-22 DIAGNOSIS — J020 Streptococcal pharyngitis: Secondary | ICD-10-CM

## 2022-04-22 DIAGNOSIS — R220 Localized swelling, mass and lump, head: Secondary | ICD-10-CM

## 2022-04-22 LAB — GROUP A STREP BY PCR: Group A Strep by PCR: DETECTED — AB

## 2022-04-22 MED ORDER — FAMOTIDINE 20 MG PO TABS: 20.0000 mg | ORAL_TABLET | Freq: Two times a day (BID) | ORAL | 0 refills | Status: DC

## 2022-04-22 MED ORDER — AMOXICILLIN 500 MG PO CAPS: 500.0000 mg | ORAL_CAPSULE | Freq: Two times a day (BID) | ORAL | 0 refills | Status: DC

## 2022-04-22 MED ORDER — LIDOCAINE VISCOUS HCL 2 % MT SOLN: 5.0000 mL | Freq: Three times a day (TID) | OROMUCOSAL | 0 refills | Status: DC

## 2022-04-22 MED ORDER — AMOXICILLIN 500 MG PO CAPS
500.0000 mg | ORAL_CAPSULE | Freq: Once | ORAL | Status: AC
  Administered 2022-04-22: 500 mg via ORAL
  Filled 2022-04-22: qty 1

## 2022-04-22 MED ORDER — PREDNISONE 10 MG (21) PO TBPK: ORAL_TABLET | ORAL | 0 refills | Status: DC

## 2022-04-22 MED ORDER — DIPHENHYDRAMINE HCL 50 MG PO TABS: 25.0000 mg | ORAL_TABLET | Freq: Four times a day (QID) | ORAL | 0 refills | Status: DC | PRN

## 2022-04-22 NOTE — Discharge Instructions (Signed)
You were seen today for fever and headache.  Your work-up is notable for strep throat.  You will be treated with amoxicillin.  Take antibiotics as directed.  If you have any new or worsening symptoms, you should be reevaluated.  Take ibuprofen as needed for pain.

## 2022-04-22 NOTE — Progress Notes (Signed)
Name: Kurt Simpson   MRN: [id number]    DOB: 1996/04/04   Date:04/22/2022       Progress Note  Subjective  Chief Complaint  Chief Complaint  Patient presents with   Facial Swelling   Follow-up    Seen in ER   Sore Throat    Positive strep    I connected with  Wyvonnia Dusky  on 04/22/22 at 11:35 am by a video enabled telemedicine application and verified that I am speaking with the correct person using two identifiers.  I discussed the limitations of evaluation and management by telemedicine and the availability of in person appointments. The patient expressed understanding and agreed to proceed with a virtual visit  Staff also discussed with the patient that there may be a patient responsible charge related to this service. Patient Location: home Provider Location: [facility] Additional Individuals present: alone  HPI  Sore throat/right side tongue and cheek swelling: Patient was seen in the emergency department last night.  Patient reported to them that he had a fever and a headache.  Temperatures ranging from 99.5-101.  He had been taking over-the-counter cold medication for relief.  That he felt like the right side of his tongue and cheek were swollen.  He denied any difficulty swallowing.  No new medications or foods prior to the onset of symptoms.  Er provider did not note any swelling upon exam. They did do a rapid strep which was positive.  They started him on amoxicillin 500 mg 2 times a day.  Today that over the weekend he had a fever of 99.5-101.6 which broke on Saturday.  He says yesterday he noticed that he was having right-sided welling to his cheek and tongue.  Patient states he had not eaten or drank anything new.  Also reported that he had not started any new medications.  Patient denies any itching, shortness of breath or difficulty swallowing.  States he did notice he had a knot on the back of his head and he felt like that was causing his headache.  He says the knot is gone.   Patient reports that the swelling had gone away while he waited to see the ER doctor.  Patient reports that he can feel like it is coming back.  Discussed with patient that it is difficult to evaluate over virtually. We will send in prescription of steroid pack, benadryl, pepcid and magic mouthwash.  Patient is aware of reasons to seek emergency care.  We will also refer to allergy for allergy testing.  Patient Active Problem List   Diagnosis Date Noted   Vaccine counseling 06/07/2021   Routine screening for STI (sexually transmitted infection) 03/29/2019   Asymptomatic HIV infection ([place]) 07/30/2018   Vitamin D deficiency 11/25/2009   Allergic rhinitis 01/17/2009    Social History   Tobacco Use   Smoking status: Never   Smokeless tobacco: Never  Substance Use Topics   Alcohol use: Yes    Alcohol/week: 0.0 standard drinks of alcohol    Comment: occ     Current Outpatient Medications:    amoxicillin (AMOXIL) 500 MG capsule, Take 1 capsule (500 mg total) by mouth 2 (two) times daily., Disp: 21 capsule, Rfl: 0   cabotegravir & rilpivirine ER (CABENUVA) 600 & 900 MG/3ML injection, Inject 1 kit into the muscle every 2 (two) months., Disp: 6 mL, Rfl: 5   diphenhydrAMINE (BENADRYL) 50 MG tablet, Take 0.5 tablets (25 mg total) by mouth every 6 (six) hours as  needed for allergies., Disp: 30 tablet, Rfl: 0   famotidine (PEPCID) 20 MG tablet, Take 1 tablet (20 mg total) by mouth 2 (two) times daily., Disp: 30 tablet, Rfl: 0   magic mouthwash (lidocaine, diphenhydrAMINE, alum & mag hydroxide) suspension, Swish and spit 5 mLs 3 (three) times daily., Disp: 360 mL, Rfl: 0   predniSONE (STERAPRED UNI-PAK 21 TAB) 10 MG (21) TBPK tablet, Take as directed on package.  (60 mg po on day 1, 50 mg po on day 2...), Disp: 21 tablet, Rfl: 0  No Known Allergies  I personally reviewed active problem list, medication list, allergies, notes from last encounter with the patient/caregiver  today.  ROS  Constitutional: Negative for fever or weight change.  Respiratory: Negative for cough and shortness of breath.   Cardiovascular: Negative for chest pain or palpitations.  Gastrointestinal: Negative for abdominal pain, no bowel changes.  Musculoskeletal: Negative for gait problem or joint swelling.  Skin: Negative for rash.  Neurological: Negative for dizziness or headache.  No other specific complaints in a complete review of systems (except as listed in HPI above).   Objective  Virtual encounter, vitals not obtained.  There is no height or weight on file to calculate BMI.  Nursing Note and Vital Signs reviewed.  Physical Exam  awake, alert, oriented x3, speaking complete sentences Unable to visualize any swelling   Results for orders placed or performed during the hospital encounter of 04/22/22 (from the past 72 hour(s))  Group A Strep by PCR     Status: Abnormal   Collection Time: 04/22/22 12:36 AM   Specimen: Throat; Sterile Swab  Result Value Ref Range   Group A Strep by PCR DETECTED (A) NOT DETECTED    Comment: Performed at [facility], [address], [place], [place] [address]    Assessment & Plan  1. Mild tongue swelling Comments: start taking prednisone, benadryl, pepcid and magic mouth wash. Also put in a referral to allergy for allergy testing.  - predniSONE (STERAPRED UNI-PAK 21 TAB) 10 MG (21) TBPK tablet; Take as directed on package.  (60 mg po on day 1, 50 mg po on day 2...)  Dispense: 21 tablet; Refill: 0 - Ambulatory referral to Allergy - diphenhydrAMINE (BENADRYL) 50 MG tablet; Take 0.5 tablets (25 mg total) by mouth every 6 (six) hours as needed for allergies.  Dispense: 30 tablet; Refill: 0 - famotidine (PEPCID) 20 MG tablet; Take 1 tablet (20 mg total) by mouth 2 (two) times daily.  Dispense: 30 tablet; Refill: 0 - magic mouthwash (lidocaine, diphenhydrAMINE, alum & mag hydroxide) suspension; Swish and spit 5 mLs 3  (three) times daily.  Dispense: 360 mL; Refill: 0  2. Cheek swelling Comments: start taking prednisone, benadryl, pepcid and magic mouth wash. Also put in a referral to allergy for allergy testing.  - predniSONE (STERAPRED UNI-PAK 21 TAB) 10 MG (21) TBPK tablet; Take as directed on package.  (60 mg po on day 1, 50 mg po on day 2...)  Dispense: 21 tablet; Refill: 0 - Ambulatory referral to Allergy - diphenhydrAMINE (BENADRYL) 50 MG tablet; Take 0.5 tablets (25 mg total) by mouth every 6 (six) hours as needed for allergies.  Dispense: 30 tablet; Refill: 0 - famotidine (PEPCID) 20 MG tablet; Take 1 tablet (20 mg total) by mouth 2 (two) times daily.  Dispense: 30 tablet; Refill: 0 - magic mouthwash (lidocaine, diphenhydrAMINE, alum & mag hydroxide) suspension; Swish and spit 5 mLs 3 (three) times daily.  Dispense: 360 mL;  Refill: 0    -Red flags and when to present for emergency care or RTC including fever >101.18F, chest pain, shortness of breath, new/worsening/un-resolving symptoms,  reviewed with patient at time of visit. Follow up and care instructions discussed and provided in AVS. - I discussed the assessment and treatment plan with the patient. The patient was provided an opportunity to ask questions and all were answered. The patient agreed with the plan and demonstrated an understanding of the instructions.  I provided 15 minutes of non-face-to-face time during this encounter.  Bo Merino, FNP

## 2022-04-22 NOTE — ED Provider Notes (Signed)
[facility] EMERGENCY DEPT Provider Note   CSN: [id number] Arrival date & time: 04/21/22  2018     History  Chief Complaint  Patient presents with   Headache   Fever    Kurt Simpson is a 26 y.o. male.  HPI     This is a 26 year old male with history of HIV who presents with fever and headache.  Patient states that throughout the weekend he had fevers from 99.5-1 01.  Last fever was approximately 24 hours ago.  He states he has had an occipital headache that comes and goes.  He has been taking over-the-counter cold medication with minimal relief.  Prior to arrival he states that he felt like the right side of his tongue and cheek were swollen.  Denied difficulty swallowing.  No new medications or foods prior to onset of symptoms.  This was approximately 4 to 5 hours ago.  Since that time it has self resolved without any intervention including Benadryl.  Chart reviewed.  Patient compliance with HIV medications.  Recent undetected viral load.  Home Medications Prior to Admission medications   Medication Sig Start Date End Date Taking? Authorizing Provider  amoxicillin (AMOXIL) 500 MG capsule Take 1 capsule (500 mg total) by mouth 2 (two) times daily. 04/22/22  Yes Ragan Duhon, Barbette Hair, MD  cabotegravir & rilpivirine ER (CABENUVA) 600 & 900 MG/3ML injection Inject 1 kit into the muscle every 2 (two) months. 09/18/21   Esmond Plants, RPH-CPP      Allergies    Patient has no known allergies.    Review of Systems   Review of Systems  Constitutional:  Positive for fever.  Neurological:  Positive for headaches.  All other systems reviewed and are negative.   Physical Exam Updated Vital Signs BP 114/70   Pulse 68   Temp 98.5 F (36.9 C)   Resp 17   Ht 1.753 m (_0 )   Wt 65.8 kg   SpO2 99%   BMI 21.41 kg/m  Physical Exam Vitals and nursing note reviewed.  Constitutional:      Appearance: He is well-developed.  HENT:     Head: Normocephalic and  atraumatic.     Mouth/Throat:     Mouth: Mucous membranes are moist.     Comments: Palatal petechiae noted, no exudate noted, uvula midline Eyes:     Pupils: Pupils are equal, round, and reactive to light.  Neck:     Comments: No meningismus Cardiovascular:     Rate and Rhythm: Normal rate and regular rhythm.     Heart sounds: Normal heart sounds.  Pulmonary:     Effort: Pulmonary effort is normal. No respiratory distress.     Breath sounds: Normal breath sounds. No wheezing.  Abdominal:     Palpations: Abdomen is soft.     Tenderness: There is no rebound.  Musculoskeletal:     Cervical back: Normal range of motion and neck supple.  Lymphadenopathy:     Cervical: Cervical adenopathy present.  Skin:    General: Skin is warm and dry.  Neurological:     Mental Status: He is alert and oriented to person, place, and time.  Psychiatric:        Mood and Affect: Mood normal.     ED Results / Procedures / Treatments   Labs (all labs ordered are listed, but only abnormal results are displayed) Labs Reviewed  GROUP A STREP BY PCR - Abnormal; Notable for the following components:  Result Value   Group A Strep by PCR DETECTED (*)    All other components within normal limits    EKG None  Radiology No results found.  Procedures Procedures    Medications Ordered in ED Medications  amoxicillin (AMOXIL) capsule 500 mg (has no administration in time range)    ED Course/ Medical Decision Making/ A&P                           Medical Decision Making Risk Prescription drug management.   This patient presents to the ED for concern of fever, headache, this involves an extensive number of treatment options, and is a complaint that carries with it a high risk of complications and morbidity.  I considered the following differential and admission for this acute, potentially life threatening condition.  The differential diagnosis includes viral syndrome such as COVID or  influenza, strep pharyngitis, meningitis  MDM:    This is a 26 year old male who presents with headache and fever.  He is nontoxic and vital signs are reassuring.  He is afebrile here.  Exam is fairly benign.  Normal range of motion of the neck and no meningismus.  Low suspicion for meningitis at this time.  He does have palatal petechiae as well as cervical adenopathy.  Question strep throat given ongoing fever although he does not have significant sore throat at this time.  Will screen.  Breath sounds are clear.  Doubt pneumonia.  Strep screen was positive.  Patient elected for 10 days of amoxicillin.  He was given first dose here.  He is generally well-appearing.  He reported swelling prior to arrival but this reportedly self resolved.  I do not see any evidence of acute allergic reaction or significant tongue or facial swelling at this time.  (Labs, imaging, consults)  Labs: I Ordered, and personally interpreted labs.  The pertinent results include: Strep screen  Imaging Studies ordered: I ordered imaging studies including none I independently visualized and interpreted imaging. I agree with the radiologist interpretation  Additional history obtained from mother at bedside.  External records from outside source obtained and reviewed including prior evaluations and medication history  Cardiac Monitoring: The patient was maintained on a cardiac monitor.  I personally viewed and interpreted the cardiac monitored which showed an underlying rhythm of: Normal sinus rhythm  Reevaluation: After the interventions noted above, I reevaluated the patient and found that they have :stayed the same  Social Determinants of Health: Lives independently  Disposition: Discharge  Co morbidities that complicate the patient evaluation  Past Medical History:  Diagnosis Date   Allergy    Enlarged tonsils 04/25/2016   HIV infection ([place])    Mononucleosis    Palpitations    Vitamin D deficiency       Medicines Meds ordered this encounter  Medications   amoxicillin (AMOXIL) capsule 500 mg   amoxicillin (AMOXIL) 500 MG capsule    Sig: Take 1 capsule (500 mg total) by mouth 2 (two) times daily.    Dispense:  21 capsule    Refill:  0    I have reviewed the patients home medicines and have made adjustments as needed  Problem List / ED Course: Problem List Items Addressed This Visit   None Visit Diagnoses     Strep throat    -  Primary                   Final Clinical Impression(s) /  ED Diagnoses Final diagnoses:  Strep throat    Rx / DC Orders ED Discharge Orders          Ordered    amoxicillin (AMOXIL) 500 MG capsule  2 times daily        04/22/22 0115              Zeph Riebel, Barbette Hair, MD 04/22/22 [id number]

## 2022-05-07 ENCOUNTER — Encounter: Payer: Self-pay | Admitting: Family Medicine

## 2022-06-03 ENCOUNTER — Other Ambulatory Visit (HOSPITAL_COMMUNITY): Payer: Self-pay

## 2022-06-04 ENCOUNTER — Other Ambulatory Visit (HOSPITAL_COMMUNITY): Payer: Self-pay

## 2022-06-04 ENCOUNTER — Telehealth: Payer: Self-pay

## 2022-06-04 NOTE — Telephone Encounter (Signed)
[facility] Patient Advocate Encounter  Patient's medication (Cabenuva) have been couriered to [facility] from [facility] and will be administered on the patient next office visit on 06/24/22.  Wong Steadham , CPhT [facility] Patient Advocate [facility] Phone: [phone] Fax:  [phone]  

## 2022-06-24 ENCOUNTER — Ambulatory Visit (INDEPENDENT_AMBULATORY_CARE_PROVIDER_SITE_OTHER): Payer: 59 | Admitting: Infectious Diseases

## 2022-06-24 ENCOUNTER — Other Ambulatory Visit (HOSPITAL_COMMUNITY)
Admission: RE | Admit: 2022-06-24 | Discharge: 2022-06-24 | Disposition: A | Discharge status: Home / Self Care | Payer: 59 | Source: Ambulatory Visit | Attending: Infectious Diseases | Admitting: Infectious Diseases

## 2022-06-24 ENCOUNTER — Other Ambulatory Visit: Payer: Self-pay

## 2022-06-24 ENCOUNTER — Encounter: Payer: Self-pay | Admitting: Infectious Diseases

## 2022-06-24 VITALS — BP 121/63 | HR 72 | Temp 98.6°F | Ht 70.0 in | Wt 131.0 lb

## 2022-06-24 DIAGNOSIS — Z7185 Encounter for immunization safety counseling: Secondary | ICD-10-CM

## 2022-06-24 DIAGNOSIS — B20 Human immunodeficiency virus [HIV] disease: Secondary | ICD-10-CM | POA: Diagnosis not present

## 2022-06-24 DIAGNOSIS — Z21 Asymptomatic human immunodeficiency virus [HIV] infection status: Secondary | ICD-10-CM

## 2022-06-24 DIAGNOSIS — Z113 Encounter for screening for infections with a predominantly sexual mode of transmission: Secondary | ICD-10-CM

## 2022-06-24 DIAGNOSIS — Z202 Contact with and (suspected) exposure to infections with a predominantly sexual mode of transmission: Secondary | ICD-10-CM

## 2022-06-24 MED ORDER — CABOTEGRAVIR & RILPIVIRINE ER 600 & 900 MG/3ML IM SUER
1.0000 | Freq: Once | INTRAMUSCULAR | Status: AC
  Administered 2022-06-24: 1 via INTRAMUSCULAR

## 2022-06-24 MED ORDER — CEFTRIAXONE SODIUM 500 MG IJ SOLR
500.0000 mg | Freq: Once | INTRAMUSCULAR | Status: AC
  Administered 2022-06-24: 500 mg via INTRAMUSCULAR

## 2022-06-24 NOTE — Progress Notes (Signed)
Name: Kurt Simpson  DOB: [birth year] MRN: [id number] PCP: Steele Sizer, MD    Brief Narrative:  Kurt Simpson is a 26 y.o. male with HIV infection diagnosed in October 2019.  History of OIs: none.  HIV Risk: MSM/bisexual.  CD4 nadir 310/VL 263,000.  YBO^F7510 (-), Quantiferon (-). Hep B sAg (-)  Previous Regimens: Dovato --> suppressed  Cabenuva, 09/18/2021  Genotypes: 07/2018 - no mutations    Subjective:   Chief Complaint  Patient presents with   Follow-up     HPI:  Kurt Simpson is here for routine follow up and Cabenuva maintenance injection today.  He has been doing well with injections thusfar with only mild injection site reactions. VLs have been durably < 20 throughout the last 8 months on this treatment. He has no concerns to continue this - prefers to get them done at the same time.    No concern over anxious/depressed mood. Sleeping and eating well. Requested STI screening tests today. No active symptoms but has had an exposure to gonorrhea that he was informed of recently.     Review of Systems  Constitutional:  Negative for chills and fever.  HENT:  Negative for sore throat.   Eyes:  Negative for visual disturbance.  Gastrointestinal:  Negative for abdominal pain, anal bleeding and rectal pain.  Genitourinary:  Negative for dysuria, genital sores, penile discharge, penile pain, scrotal swelling and testicular pain.  Musculoskeletal:  Negative for arthralgias and joint swelling.  Skin:  Negative for rash.  Neurological:  Negative for headaches.  Hematological:  Negative for adenopathy.     Past Medical History:  Diagnosis Date   Allergy    Enlarged tonsils 04/25/2016   HIV infection ([place])    Mononucleosis    Palpitations    Vitamin D deficiency     Outpatient Medications Prior to Visit  Medication Sig Dispense Refill   cabotegravir & rilpivirine ER (CABENUVA) 600 & 900 MG/3ML injection Inject 1 kit into the muscle every 2 (two) months. 6 mL 5    diphenhydrAMINE (BENADRYL) 50 MG tablet Take 0.5 tablets (25 mg total) by mouth every 6 (six) hours as needed for allergies. 30 tablet 0   amoxicillin (AMOXIL) 500 MG capsule Take 1 capsule (500 mg total) by mouth 2 (two) times daily. (Patient not taking: Reported on 06/24/2022) 21 capsule 0   famotidine (PEPCID) 20 MG tablet Take 1 tablet (20 mg total) by mouth 2 (two) times daily. (Patient not taking: Reported on 06/24/2022) 30 tablet 0   magic mouthwash (lidocaine, diphenhydrAMINE, alum & mag hydroxide) suspension Swish and spit 5 mLs 3 (three) times daily. (Patient not taking: Reported on 06/24/2022) 360 mL 0   predniSONE (STERAPRED UNI-PAK 21 TAB) 10 MG (21) TBPK tablet Take as directed on package.  (60 mg po on day 1, 50 mg po on day 2...) (Patient not taking: Reported on 06/24/2022) 21 tablet 0   No facility-administered medications prior to visit.    No Known Allergies  Social History   Tobacco Use   Smoking status: Never   Smokeless tobacco: Never  Vaping Use   Vaping Use: Never used  Substance Use Topics   Alcohol use: Not Currently    Comment: occ   Drug use: Not Currently    Types: Marijuana    Social History   Substance and Sexual Activity  Sexual Activity Yes   Partners: Male   Birth control/protection: Condom   Comment: Declined condoms     Objective:  Vitals:   06/24/22 0938  BP: 121/63  Pulse: 72  Temp: 98.6 F (37 C)  TempSrc: Oral  SpO2: 100%  Weight: 131 lb (59.4 kg)  Height: '5\' 10"'  (1.778 m)   Body mass index is 18.8 kg/m.  Physical Exam Constitutional:      Appearance: Normal appearance. He is not ill-appearing.  HENT:     Head: Normocephalic.     Mouth/Throat:     Mouth: Mucous membranes are moist.     Pharynx: Oropharynx is clear.  Eyes:     General: No scleral icterus. Pulmonary:     Effort: Pulmonary effort is normal.  Musculoskeletal:        General: Normal range of motion.     Cervical back: Normal range of motion.  Skin:     Coloration: Skin is not jaundiced or pale.  Neurological:     Mental Status: He is alert and oriented to person, place, and time.  Psychiatric:        Mood and Affect: Mood normal.        Judgment: Judgment normal.      Lab Results Lab Results  Component Value Date   WBC 3.5 (L) 05/02/2021   HGB 14.7 05/02/2021   HCT 45.5 05/02/2021   MCV 84.9 05/02/2021   PLT 256 05/02/2021    Lab Results  Component Value Date   CREATININE 0.97 05/02/2021   BUN 10 05/02/2021   NA 139 05/02/2021   K 4.3 05/02/2021   CL 104 05/02/2021   CO2 28 05/02/2021    Lab Results  Component Value Date   ALT 11 05/02/2021   AST 15 05/02/2021   ALKPHOS 67 05/10/2018   BILITOT 0.4 05/02/2021    Lab Results  Component Value Date   CHOL 141 05/02/2021   HDL 53 05/02/2021   LDLCALC 75 05/02/2021   TRIG 51 05/02/2021   CHOLHDL 2.7 05/02/2021   HIV 1 RNA Quant  Date Value  04/17/2022 Not Detected Copies/mL  02/13/2022 NOT DETECTED copies/mL  12/18/2021 Not Detected Copies/mL   CD4 T Cell Abs (/uL)  Date Value  06/07/2021 513  11/27/2020 415  07/21/2020 555     Assessment & Plan:   Problem List Items Addressed This Visit       Unprioritized   Asymptomatic HIV infection ([place]) (Chronic)    Very well controlled on every 56mCabenuva 600/900 mg injections. No concerns with access or adherence to medication. They are tolerating the medication well without significant side effects. No drug interactions identified. Pertinent lab tests ordered today - we can start spacing out his VL's to every 421mWill need CD4, RPR, CMP and CBC at next visit.   No changes to insurance coverage.  No dental needs today.  No concern over anxious/depressed mood.  Sexual health and family planning discussed - presumptive tx with (+) contact exposure today for gonorrhea 500 mg x 1 IM.  Vaccines updated today - see health maintenance section          Vaccine counseling    menveo due in November 2023. Prevnar  20 in 2025.  Recommend annual flu vaccine at his convenience sometime before November preferably.       Gonorrhea contact    Treat with 500 mg ceftriaxone IM x 1 today. Swabs collected today as well from urine and extragenital locations.      Other Visit Diagnoses     [place] disease ([place]   -  Primary  Relevant Orders   HIV 1 RNA quant-no reflex-bld   T-helper cells (CD4) count   COMPLETE METABOLIC PANEL WITH GFR   CBC   Screening for STDs (sexually transmitted diseases)       Relevant Orders   Cytology (oral, anal, urethral) ancillary only   Cytology (oral, anal, urethral) ancillary only   Urine cytology ancillary only   RPR       Janene Madeira, MSN, NP-C [facility] [email or web address] Pager: [phone] Office: [place]: [phone]   06/24/22  10:05 AM

## 2022-06-24 NOTE — Assessment & Plan Note (Signed)
menveo due in November 2023. Prevnar 20 in 2025.  Recommend annual flu vaccine at his convenience sometime before November preferably.

## 2022-06-24 NOTE — Addendum Note (Signed)
Addended by: Lin Landsman E on: 06/24/2022 11:52 AM   Modules accepted: Orders

## 2022-06-24 NOTE — Assessment & Plan Note (Addendum)
Treat with 500 mg ceftriaxone IM x 1 today. Swabs collected today as well from urine and extragenital locations.

## 2022-06-24 NOTE — Assessment & Plan Note (Signed)
Very well controlled on every 60m Cabenuva 600/900 mg injections. No concerns with access or adherence to medication. They are tolerating the medication well without significant side effects. No drug interactions identified. Pertinent lab tests ordered today - we can start spacing out his VL's to every 75m. Will need CD4, RPR, CMP and CBC at next visit.   No changes to insurance coverage.  No dental needs today.  No concern over anxious/depressed mood.  Sexual health and family planning discussed - presumptive tx with (+) contact exposure today for gonorrhea 500 mg x 1 IM.  Vaccines updated today - see health maintenance section

## 2022-06-24 NOTE — Patient Instructions (Addendum)
Nice to see you!   All seems to be working very well for you. Will space out blood draws to every other injection visit.   Will plan to give you your meningitis booster vaccine at your November appointment.   **Please also plan to get the flu shot at your convenience. We can give it to you here once they are available.   Will let you know what your test results show today.   Next appt: 08/20/2022 with our pharmacy team.   I will plan to see you back at your March 2024 appointment while our pharmacy team helps you with your appointments in between.

## 2022-06-25 LAB — CYTOLOGY, (ORAL, ANAL, URETHRAL) ANCILLARY ONLY
Chlamydia: NEGATIVE
Chlamydia: NEGATIVE
Comment: NEGATIVE
Comment: NEGATIVE
Comment: NORMAL
Comment: NORMAL
Neisseria Gonorrhea: NEGATIVE
Neisseria Gonorrhea: POSITIVE — AB

## 2022-06-25 LAB — URINE CYTOLOGY ANCILLARY ONLY
Chlamydia: NEGATIVE
Comment: NEGATIVE
Comment: NORMAL
Neisseria Gonorrhea: NEGATIVE

## 2022-06-26 ENCOUNTER — Telehealth: Payer: Self-pay

## 2022-06-26 NOTE — Telephone Encounter (Signed)
Spoke with patient to relay positive gonorrhea results. He was treated with ceftriaxone 500 mg IM at office visit 9/18.   Advised him to refrain from sex for 7-10 days and to notify partners for testing and treatment. Patient verbalized understanding and has no further questions.   Beryle Flock, RN

## 2022-06-26 NOTE — Telephone Encounter (Signed)
-----   Message from Montrose Callas, NP sent at 06/25/2022  9:14 PM EDT ----- Please call Marjory Lies to arrange 500 mg Ceftriaxone x 1 IM for gonorrhea infection.   I sent him a result note on mychart as well.  Thank you

## 2022-07-23 ENCOUNTER — Other Ambulatory Visit: Payer: Self-pay

## 2022-07-23 MED ORDER — FLUTICASONE PROPIONATE 50 MCG/ACT NA SUSP
NASAL | 3 refills | Status: DC
  Filled 2022-07-23: qty 16, 30d supply, fill #0

## 2022-07-24 ENCOUNTER — Other Ambulatory Visit (HOSPITAL_COMMUNITY): Payer: Self-pay

## 2022-07-25 ENCOUNTER — Other Ambulatory Visit (HOSPITAL_COMMUNITY): Payer: Self-pay

## 2022-07-29 ENCOUNTER — Other Ambulatory Visit (HOSPITAL_COMMUNITY): Payer: Self-pay

## 2022-07-29 ENCOUNTER — Telehealth: Payer: Self-pay

## 2022-07-29 NOTE — Patient Instructions (Signed)
Preventive Care 21-25 Years Old, Male Preventive care refers to lifestyle choices and visits with your health care provider that can promote health and wellness. Preventive care visits are also called wellness exams. What can I expect for my preventive care visit? Counseling During your preventive care visit, your health care provider may ask about your: Medical history, including: Past medical problems. Family medical history. Current health, including: Emotional well-being. Home life and relationship well-being. Sexual activity. Lifestyle, including: Alcohol, nicotine or tobacco, and drug use. Access to firearms. Diet, exercise, and sleep habits. Safety issues such as seatbelt and bike helmet use. Sunscreen use. Work and work environment. Physical exam Your health care provider may check your: Height and weight. These may be used to calculate your BMI (body mass index). BMI is a measurement that tells if you are at a healthy weight. Waist circumference. This measures the distance around your waistline. This measurement also tells if you are at a healthy weight and may help predict your risk of certain diseases, such as type 2 diabetes and high blood pressure. Heart rate and blood pressure. Body temperature. Skin for abnormal spots. What immunizations do I need?  Vaccines are usually given at various ages, according to a schedule. Your health care provider will recommend vaccines for you based on your age, medical history, and lifestyle or other factors, such as travel or where you work. What tests do I need? Screening Your health care provider may recommend screening tests for certain conditions. This may include: Lipid and cholesterol levels. Diabetes screening. This is done by checking your blood sugar (glucose) after you have not eaten for a while (fasting). Hepatitis B test. Hepatitis C test. HIV (human immunodeficiency virus) test. STI (sexually transmitted infection)  testing, if you are at risk. Talk with your health care provider about your test results, treatment options, and if necessary, the need for more tests. Follow these instructions at home: Eating and drinking  Eat a healthy diet that includes fresh fruits and vegetables, whole grains, lean protein, and low-fat dairy products. Drink enough fluid to keep your urine pale yellow. Take vitamin and mineral supplements as recommended by your health care provider. Do not drink alcohol if your health care provider tells you not to drink. If you drink alcohol: Limit how much you have to 0-2 drinks a day. Know how much alcohol is in your drink. In the [place] one drink equals one 12 oz bottle of beer (355 mL), one 5 oz glass of wine (148 mL), or one 1 oz glass of hard liquor (44 mL). Lifestyle Brush your teeth every morning and night with fluoride toothpaste. Floss one time each day. Exercise for at least 30 minutes 5 or more days each week. Do not use any products that contain nicotine or tobacco. These products include cigarettes, chewing tobacco, and vaping devices, such as e-cigarettes. If you need help quitting, ask your health care provider. Do not use drugs. If you are sexually active, practice safe sex. Use a condom or other form of protection to prevent STIs. Find healthy ways to manage stress, such as: Meditation, yoga, or listening to music. Journaling. Talking to a trusted person. Spending time with friends and family. Minimize exposure to UV radiation to reduce your risk of skin cancer. Safety Always wear your seat belt while driving or riding in a vehicle. Do not drive: If you have been drinking alcohol. Do not ride with someone who has been drinking. If you have been using any mind-altering substances   or drugs. While texting. When you are tired or distracted. Wear a helmet and other protective equipment during sports activities. If you have firearms in your house, make sure you  follow all gun safety procedures. Seek help if you have been physically or sexually abused. What's next? Go to your health care provider once a year for an annual wellness visit. Ask your health care provider how often you should have your eyes and teeth checked. Stay up to date on all vaccines. This information is not intended to replace advice given to you by your health care provider. Make sure you discuss any questions you have with your health care provider. Document Revised: 03/21/2021 Document Reviewed: 03/21/2021 Elsevier Patient Education  2023 [employer]  

## 2022-07-29 NOTE — Telephone Encounter (Signed)
[facility] Patient Advocate Encounter  Patient's medication (Cabenuva) have been couriered to [facility] from [facility] and will be administered on the patient next office visit on 08/20/22.  Shalawn Wynder , CPhT [facility] Phone: [phone] Fax:  [phone]  

## 2022-07-29 NOTE — Progress Notes (Signed)
Name: Kurt Simpson   MRN: [id number]    DOB: Mar 14, 1996   Date:07/30/2022       Progress Note  Subjective  Chief Complaint  Annual Exam  HPI  Patient presents for annual CPE.  Diet: he cooks at home , avoiding a lot of red meat, eating more seafood , chicken and [place], he eat plenty of vegetables and is trying to eat more fruit  Exercise: active at work  Last Dental Exam: yearly  Last Eye Exam: yearly   Depression: phq 9 is negative    07/30/2022    3:35 PM 06/24/2022    9:48 AM 04/22/2022   11:28 AM 12/18/2021    9:01 AM 09/18/2021    2:10 PM  Depression screen PHQ 2/9  Decreased Interest 0 0 0 0 0  Down, Depressed, Hopeless 0 0 0 0 0  PHQ - 2 Score 0 0 0 0 0  Altered sleeping 0      Tired, decreased energy 0      Change in appetite 0      Feeling bad or failure about yourself  0      Trouble concentrating 0      Moving slowly or fidgety/restless 0      Suicidal thoughts 0      PHQ-9 Score 0        Hypertension:  BP Readings from Last 3 Encounters:  07/30/22 116/66  06/24/22 121/63  04/22/22 115/70    Obesity: Wt Readings from Last 3 Encounters:  07/30/22 138 lb 8 oz (62.8 kg)  06/24/22 131 lb (59.4 kg)  04/21/22 145 lb (65.8 kg)   BMI Readings from Last 3 Encounters:  07/30/22 19.73 kg/m  06/24/22 18.80 kg/m  04/21/22 21.41 kg/m     Lipids:  Lab Results  Component Value Date   CHOL 141 05/02/2021   CHOL 157 07/21/2020   CHOL 159 08/27/2019   Lab Results  Component Value Date   HDL 53 05/02/2021   HDL 35 (L) 07/21/2020   HDL 43 08/27/2019   Lab Results  Component Value Date   LDLCALC 75 05/02/2021   LDLCALC 102 (H) 07/21/2020   LDLCALC 102 (H) 08/27/2019   Lab Results  Component Value Date   TRIG 51 05/02/2021   TRIG 106 07/21/2020   TRIG 46 08/27/2019   Lab Results  Component Value Date   CHOLHDL 2.7 05/02/2021   CHOLHDL 4.5 07/21/2020   CHOLHDL 3.7 08/27/2019   No results found for: "LDLDIRECT" Glucose:  Glucose, Bld   Date Value Ref Range Status  05/02/2021 78 65 - 99 mg/dL Final    Comment:    .            Fasting reference interval .   11/27/2020 93 65 - 99 mg/dL Final    Comment:    .            Fasting reference interval .   07/21/2020 87 65 - 99 mg/dL Final    Comment:    .            Fasting reference interval .     [place] Office Visit from 07/30/2022 in [facility]  AUDIT-C Score 1      Single STD testing and prevention (HIV/chl/gon/syphilis): 06/24/22 Sexual history: same partner, together over one year , gets for STI by ID provider  Hep C Screening: 07/15/18 Skin cancer: Discussed monitoring for atypical lesions Colorectal cancer: N/A Prostate cancer: N/A  ECG:  11/05/07  Vaccines:   HPV: up to date Tdap: up to date Shingrix: N/A Pneumonia: up to date  Flu: today  COVID-19: up to date  Advanced Care Planning: A voluntary discussion about advance care planning including the explanation and discussion of advance directives.  Discussed health care proxy and Living will, and the patient was able to identify a health care proxy as mother .  Patient does not have a living will and power of attorney of health care   Patient Active Problem List   Diagnosis Date Noted   Gonorrhea contact 06/24/2022   Vaccine counseling 06/07/2021   Routine screening for STI (sexually transmitted infection) 03/29/2019   Asymptomatic HIV infection ([place]) 07/30/2018   Vitamin D deficiency 11/25/2009   Allergic rhinitis 01/17/2009    Past Surgical History:  Procedure Laterality Date   TONSILLECTOMY AND ADENOIDECTOMY N/A 09/08/2018   Procedure: TONSILLECTOMY AND possible  ADENOIDECTOMY;  Surgeon: Clyde Canterbury, MD;  Location: [facility];  Service: ENT;  Laterality: N/A;    Family History  Problem Relation Age of Onset   Healthy Mother    Migraines Mother    Healthy Father     Social History   Socioeconomic History   Marital status: Single     Spouse name: Not on file   Number of children: 0   Years of education: Not on file   Highest education level: Not on file  Occupational History   Not on file  Tobacco Use   Smoking status: Never   Smokeless tobacco: Never  Vaping Use   Vaping Use: Never used  Substance and Sexual Activity   Alcohol use: Not Currently    Comment: occ   Drug use: Not Currently    Types: Marijuana   Sexual activity: Yes    Partners: Male    Birth control/protection: Condom  Other Topics Concern   Not on file  Social History Narrative   He lives with his mother, step-dad , younger sister.    He was taking classes a [employer], but has taken some time off.    Social Determinants of Health   Financial Resource Strain: Low Risk  (07/30/2022)   Overall Financial Resource Strain (CARDIA)    Difficulty of Paying Living Expenses: Not hard at all  Food Insecurity: No Food Insecurity (07/30/2022)   Hunger Vital Sign    Worried About Running Out of Food in the Last Year: Never true    Ran Out of Food in the Last Year: Never true  Transportation Needs: No Transportation Needs (07/30/2022)   PRAPARE - Hydrologist (Medical): No    Lack of Transportation (Non-Medical): No  Physical Activity: Sufficiently Active (07/30/2022)   Exercise Vital Sign    Days of Exercise per Week: 5 days    Minutes of Exercise per Session: 80 min  Stress: No Stress Concern Present (07/30/2022)   [place]    Feeling of Stress : Not at all  Social Connections: Moderately Integrated (07/30/2022)   Social Connection and Isolation Panel [NHANES]    Frequency of Communication with Friends and Family: More than three times a week    Frequency of Social Gatherings with Friends and Family: Once a week    Attends Religious Services: More than 4 times per year    Active Member of [employer] or Organizations: Yes    Attends Archivist  Meetings: More than 4 times per  year    Marital Status: Never married  Intimate Partner Violence: Not At Risk (07/30/2022)   Humiliation, Afraid, Rape, and Kick questionnaire    Fear of Current or Ex-Partner: No    Emotionally Abused: No    Physically Abused: No    Sexually Abused: No     Current Outpatient Medications:    cabotegravir & rilpivirine ER (CABENUVA) 600 & 900 MG/3ML injection, Inject 1 kit into the muscle every 2 (two) months., Disp: 6 mL, Rfl: 5   diphenhydrAMINE (BENADRYL) 50 MG tablet, Take 0.5 tablets (25 mg total) by mouth every 6 (six) hours as needed for allergies. (Patient not taking: Reported on 07/30/2022), Disp: 30 tablet, Rfl: 0   fluticasone (FLONASE) 50 MCG/ACT nasal spray, 1-2 sprays in each nostril  once a day 30 days (Patient not taking: Reported on 07/30/2022), Disp: 16 g, Rfl: 3  No Known Allergies   ROS  Constitutional: Negative for fever or weight change.  Respiratory: Negative for cough and shortness of breath.   Cardiovascular: Negative for chest pain or palpitations.  Gastrointestinal: Negative for abdominal pain, no bowel changes.  Musculoskeletal: Negative for gait problem or joint swelling.  Skin: Negative for rash.  Neurological: Negative for dizziness or headache.  No other specific complaints in a complete review of systems (except as listed in HPI above).    Objective  Vitals:   07/30/22 1532  BP: 116/66  Pulse: 82  Resp: 14  Temp: 98.7 F (37.1 C)  TempSrc: Oral  SpO2: 98%  Weight: 138 lb 8 oz (62.8 kg)  Height: 5' 10.25" (1.784 m)    Body mass index is 19.73 kg/m.  Physical Exam  Constitutional: Patient appears well-developed and well-nourished. No distress.  HENT: Head: Normocephalic and atraumatic. Ears: B TMs ok, no erythema or effusion; Nose: Nose normal. Mouth/Throat: Oropharynx is clear and moist. No oropharyngeal exudate.  Eyes: Conjunctivae and EOM are normal. Pupils are equal, round, and reactive to light. No  scleral icterus.  Neck: Normal range of motion. Neck supple. No JVD present. No thyromegaly present.  Cardiovascular: Normal rate, regular rhythm and normal heart sounds.  No murmur heard. No BLE edema. Pulmonary/Chest: Effort normal and breath sounds normal. No respiratory distress. Abdominal: Soft. Bowel sounds are normal, no distension. There is no tenderness. no masses MALE GENITALIA: Normal descended testes bilaterally, no masses palpated, no hernias, no lesions, no discharge RECTAL: not done  Musculoskeletal: Normal range of motion, no joint effusions. No gross deformities Neurological: he is alert and oriented to person, place, and time. No cranial nerve deficit. Coordination, balance, strength, speech and gait are normal.  Skin: Skin is warm and dry. No rash noted. No erythema.  Psychiatric: Patient has a normal mood and affect. behavior is normal. Judgment and thought content normal.   Recent Results (from the past 2160 hour(s))  Cytology (oral, anal, urethral) ancillary only     Status: None   Collection Time: 06/24/22  9:43 AM  Result Value Ref Range   Neisseria Gonorrhea Negative    Chlamydia Negative    Comment Normal Reference Ranger Chlamydia - Negative    Comment      Normal Reference Range Neisseria Gonorrhea - Negative  Cytology (oral, anal, urethral) ancillary only     Status: Abnormal   Collection Time: 06/24/22  9:43 AM  Result Value Ref Range   Neisseria Gonorrhea Positive (A)    Chlamydia Negative    Comment Normal Reference Ranger Chlamydia - Negative    Comment  Normal Reference Range Neisseria Gonorrhea - Negative  Urine cytology ancillary only     Status: None   Collection Time: 06/24/22  9:43 AM  Result Value Ref Range   Neisseria Gonorrhea Negative    Chlamydia Negative    Comment Normal Reference Ranger Chlamydia - Negative    Comment      Normal Reference Range Neisseria Gonorrhea - Negative     Fall Risk:    07/30/2022    3:34 PM  06/24/2022    9:48 AM 04/22/2022   11:27 AM 12/18/2021    9:01 AM 09/18/2021    2:10 PM  Fall Risk   Falls in the past year? 0 0 0 0 0  Number falls in past yr: 0  0 0   Injury with Fall? 0  0 0   Risk for fall due to : No Fall Risks No Fall Risks     Follow up Falls prevention discussed;Education provided;Falls evaluation completed Falls evaluation completed Falls evaluation completed       Functional Status Survey: Is the patient deaf or have difficulty hearing?: No Does the patient have difficulty seeing, even when wearing glasses/contacts?: No Does the patient have difficulty concentrating, remembering, or making decisions?: No Does the patient have difficulty walking or climbing stairs?: No Does the patient have difficulty dressing or bathing?: No Does the patient have difficulty doing errands alone such as visiting a doctor's office or shopping?: No    Assessment & Plan  1. Well adult exam   2. Need for immunization against influenza  - Flu Vaccine QUAD 6+ mos PF IM (Fluarix Quad PF)     -Prostate cancer screening and PSA options (with potential risks and benefits of testing vs not testing) were discussed along with recent recs/guidelines. -USPSTF grade A and B recommendations reviewed with patient; age-appropriate recommendations, preventive care, screening tests, etc discussed and encouraged; healthy living encouraged; see AVS for patient education given to patient -Discussed importance of 150 minutes of physical activity weekly, eat two servings of fish weekly, eat one serving of tree nuts ( cashews, pistachios, pecans, almonds.Marland Kitchen) every other day, eat 6 servings of fruit/vegetables daily and drink plenty of water and avoid sweet beverages.  -Reviewed Health Maintenance: yes

## 2022-07-30 ENCOUNTER — Other Ambulatory Visit: Payer: Self-pay

## 2022-07-30 ENCOUNTER — Encounter: Payer: Self-pay | Admitting: Family Medicine

## 2022-07-30 ENCOUNTER — Ambulatory Visit (INDEPENDENT_AMBULATORY_CARE_PROVIDER_SITE_OTHER): Payer: 59 | Admitting: Family Medicine

## 2022-07-30 VITALS — BP 116/66 | HR 82 | Temp 98.7°F | Resp 14 | Ht 70.25 in | Wt 138.5 lb

## 2022-07-30 DIAGNOSIS — Z23 Encounter for immunization: Secondary | ICD-10-CM | POA: Diagnosis not present

## 2022-07-30 DIAGNOSIS — Z0000 Encounter for general adult medical examination without abnormal findings: Secondary | ICD-10-CM

## 2022-08-20 ENCOUNTER — Other Ambulatory Visit: Payer: Self-pay

## 2022-08-20 ENCOUNTER — Ambulatory Visit (INDEPENDENT_AMBULATORY_CARE_PROVIDER_SITE_OTHER): Payer: 59 | Admitting: Pharmacist

## 2022-08-20 ENCOUNTER — Other Ambulatory Visit (HOSPITAL_COMMUNITY)
Admission: RE | Admit: 2022-08-20 | Discharge: 2022-08-20 | Disposition: A | Discharge status: Home / Self Care | Payer: 59 | Source: Ambulatory Visit | Attending: Infectious Diseases | Admitting: Infectious Diseases

## 2022-08-20 DIAGNOSIS — B20 Human immunodeficiency virus [HIV] disease: Secondary | ICD-10-CM | POA: Diagnosis not present

## 2022-08-20 DIAGNOSIS — Z113 Encounter for screening for infections with a predominantly sexual mode of transmission: Secondary | ICD-10-CM

## 2022-08-20 MED ORDER — CABOTEGRAVIR & RILPIVIRINE ER 600 & 900 MG/3ML IM SUER
1.0000 | Freq: Once | INTRAMUSCULAR | Status: AC
  Administered 2022-08-20: 1 via INTRAMUSCULAR

## 2022-08-20 NOTE — Progress Notes (Signed)
HPI: Kurt Simpson is a 26 y.o. male who presents to the [facility] for [place] administration.  Patient Active Problem List   Diagnosis Date Noted   Routine screening for STI (sexually transmitted infection) 03/29/2019   Asymptomatic HIV infection ([place]) 07/30/2018   Vitamin D deficiency 11/25/2009   Allergic rhinitis 01/17/2009    Patient's Medications  New Prescriptions   No medications on file  Previous Medications   CABOTEGRAVIR & RILPIVIRINE ER (CABENUVA) 600 & 900 MG/3ML INJECTION    Inject 1 kit into the muscle every 2 (two) months.  Modified Medications   No medications on file  Discontinued Medications   No medications on file    Allergies: No Known Allergies  Past Medical History: Past Medical History:  Diagnosis Date   Allergy    Enlarged tonsils 04/25/2016   HIV infection ([place])    Mononucleosis    Palpitations    Vitamin D deficiency     Social History: Social History   Socioeconomic History   Marital status: Single    Spouse name: Not on file   Number of children: 0   Years of education: Not on file   Highest education level: Not on file  Occupational History   Not on file  Tobacco Use   Smoking status: Never   Smokeless tobacco: Never  Vaping Use   Vaping Use: Never used  Substance and Sexual Activity   Alcohol use: Not Currently    Comment: occ   Drug use: Not Currently    Types: Marijuana   Sexual activity: Yes    Partners: Male    Birth control/protection: Condom  Other Topics Concern   Not on file  Social History Narrative   He lives with his mother, step-dad , younger sister.    He was taking classes a [employer], but has taken some time off.    Social Determinants of Health   Financial Resource Strain: Low Risk  (07/30/2022)   Overall Financial Resource Strain (CARDIA)    Difficulty of Paying Living Expenses: Not hard at all  Food Insecurity: No Food Insecurity (07/30/2022)   Hunger Vital Sign    Worried About  Running Out of Food in the Last Year: Never true    Ran Out of Food in the Last Year: Never true  Transportation Needs: No Transportation Needs (07/30/2022)   PRAPARE - Hydrologist (Medical): No    Lack of Transportation (Non-Medical): No  Physical Activity: Sufficiently Active (07/30/2022)   Exercise Vital Sign    Days of Exercise per Week: 5 days    Minutes of Exercise per Session: 80 min  Stress: No Stress Concern Present (07/30/2022)   [place]    Feeling of Stress : Not at all  Social Connections: Moderately Integrated (07/30/2022)   Social Connection and Isolation Panel [NHANES]    Frequency of Communication with Friends and Family: More than three times a week    Frequency of Social Gatherings with Friends and Family: Once a week    Attends Religious Services: More than 4 times per year    Active Member of Clubs or Organizations: Yes    Attends Archivist Meetings: More than 4 times per year    Marital Status: Never married    Labs: Lab Results  Component Value Date   HIV1RNAQUANT Not Detected 04/17/2022   HIV1RNAQUANT NOT DETECTED 02/13/2022   HIV1RNAQUANT Not Detected 12/18/2021  CD4TABS 513 06/07/2021   CD4TABS 415 11/27/2020   CD4TABS 555 07/21/2020    RPR and STI Lab Results  Component Value Date   LABRPR NON-REACTIVE 04/17/2022   LABRPR NON-REACTIVE 12/18/2021   LABRPR NON-REACTIVE 09/18/2021   LABRPR NON-REACTIVE 06/07/2021   LABRPR NON-REACTIVE 05/02/2021    STI Results GC CT  06/24/2022  9:43 AM Negative    Positive    Negative  Negative    Negative    Negative   04/17/2022  9:34 AM Negative    Negative    Negative  Negative    Negative    Negative   02/13/2022  9:38 AM Negative    Negative    Negative  Negative    Negative    Negative   10/17/2021 10:22 AM Negative    Negative    Negative  Negative    Negative    Negative    09/18/2021  2:34 PM Negative  Negative   09/18/2021  2:33 PM Negative    Negative  Negative    Negative   06/07/2021  3:12 PM Negative  Negative   06/07/2021  3:11 PM Negative    Negative  Negative    Negative   05/02/2021 12:03 PM Negative    Negative  Negative    Negative   05/02/2021 11:48 AM Negative  Negative   03/12/2021 10:38 AM Negative    Negative  Negative    Negative   03/12/2021 10:37 AM Negative  Negative   12/12/2020 10:56 AM Negative    Negative    Negative  Negative    Negative    Negative   08/03/2020 11:05 AM Positive  Negative   08/03/2020 11:04 AM Negative  Positive   07/21/2020 11:00 AM Negative  Negative   05/09/2020 11:04 AM Negative  Positive   05/09/2020 11:03 AM Negative  Negative   05/09/2020 11:01 AM Positive  Negative   09/23/2019  7:30 AM Positive    Negative    Negative  Negative    Negative    Negative     Hepatitis B Lab Results  Component Value Date   HEPBSAB REACTIVE (A) 03/29/2019   HEPBSAG NON-REACTIVE 07/15/2018   HEPBCAB NON-REACTIVE 07/15/2018   Hepatitis C Lab Results  Component Value Date   HEPCAB NON-REACTIVE 07/15/2018   Hepatitis A Lab Results  Component Value Date   HAV REACTIVE (A) 07/15/2018   Lipids: Lab Results  Component Value Date   CHOL 141 05/02/2021   TRIG 51 05/02/2021   HDL 53 05/02/2021   CHOLHDL 2.7 05/02/2021   LDLCALC 75 05/02/2021    TARGET DATE: 16th of each month   Assessment: [name] presents today for his maintenance Cabenuva injections. Past injections were tolerated well without issues. No symptoms of an STI today. Last STI screening 06/24/22 positive for gonorrhea, which was appropriately treated with IM ceftriaxone. Patient offfered condoms during the visit and politely declined.   Administered cabotegravir 641m/3mL in left upper outer quadrant of the gluteal muscle. Administered rilpivirine 900 mg/339min the right upper outer quadrant of the gluteal muscle. No issues with  injections. He will follow up in 2 months for next set of injections.  Discussed risks and benefits of Menveo and patient declined vaccination during this visit. He has already received the influenza vaccine and declined the COVID vaccine during today's visit.   Plan: - Cabenuva injections administered - F/U urine, rectal, and oral cytologies, RPR  - F/U CD4, HIV RNA, CMET, lipid panel,  CBC - Offer meningococcal booster again at future visit   - Next injections scheduled for 10/22/22 with Estill Bamberg  - Call with any issues or questions  Adria Dill, PharmD PGY-2 Infectious Diseases Resident  08/20/2022 9:22 AM

## 2022-08-21 LAB — CYTOLOGY, (ORAL, ANAL, URETHRAL) ANCILLARY ONLY
Chlamydia: NEGATIVE
Chlamydia: POSITIVE — AB
Comment: NEGATIVE
Comment: NEGATIVE
Comment: NORMAL
Comment: NORMAL
Neisseria Gonorrhea: NEGATIVE
Neisseria Gonorrhea: NEGATIVE

## 2022-08-21 LAB — T-HELPER CELLS (CD4) COUNT (NOT AT ARMC)
CD4 % Helper T Cell: 38 % (ref 33–65)
CD4 T Cell Abs: 497 /uL (ref 400–1790)

## 2022-08-21 LAB — URINE CYTOLOGY ANCILLARY ONLY
Chlamydia: NEGATIVE
Comment: NEGATIVE
Comment: NORMAL
Neisseria Gonorrhea: NEGATIVE

## 2022-08-22 ENCOUNTER — Telehealth: Payer: Self-pay

## 2022-08-22 NOTE — Telephone Encounter (Signed)
Thank you - Kurt Simpson is reaching out to him today!

## 2022-08-22 NOTE — Telephone Encounter (Signed)
Patient's rectal swab (+) for chlamydia. Will route to provider for orders.   Sandie Ano, RN

## 2022-08-23 ENCOUNTER — Telehealth: Payer: Self-pay | Admitting: Pharmacist

## 2022-08-23 ENCOUNTER — Other Ambulatory Visit: Payer: Self-pay | Admitting: Pharmacist

## 2022-08-23 DIAGNOSIS — A749 Chlamydial infection, unspecified: Secondary | ICD-10-CM

## 2022-08-23 LAB — HIV-1 RNA QUANT-NO REFLEX-BLD
HIV 1 RNA Quant: NOT DETECTED Copies/mL
HIV-1 RNA Quant, Log: NOT DETECTED Log cps/mL

## 2022-08-23 LAB — COMPLETE METABOLIC PANEL WITH GFR
AG Ratio: 1.8 (calc) (ref 1.0–2.5)
ALT: 12 U/L (ref 9–46)
AST: 12 U/L (ref 10–40)
Albumin: 4.5 g/dL (ref 3.6–5.1)
Alkaline phosphatase (APISO): 67 U/L (ref 36–130)
BUN: 13 mg/dL (ref 7–25)
CO2: 29 mmol/L (ref 20–32)
Calcium: 9.6 mg/dL (ref 8.6–10.3)
Chloride: 104 mmol/L (ref 98–110)
Creat: 0.83 mg/dL (ref 0.60–1.24)
Globulin: 2.5 g/dL (calc) (ref 1.9–3.7)
Glucose, Bld: 79 mg/dL (ref 65–99)
Potassium: 4.3 mmol/L (ref 3.5–5.3)
Sodium: 140 mmol/L (ref 135–146)
Total Bilirubin: 0.3 mg/dL (ref 0.2–1.2)
Total Protein: 7 g/dL (ref 6.1–8.1)
eGFR: 125 mL/min/{1.73_m2} (ref >=60)

## 2022-08-23 LAB — CBC
HCT: 41.9 % (ref 38.5–50.0)
Hemoglobin: 13.8 g/dL (ref 13.2–17.1)
MCH: 25.4 pg — ABNORMAL LOW (ref 27.0–33.0)
MCHC: 32.9 g/dL (ref 32.0–36.0)
MCV: 77.2 fL — ABNORMAL LOW (ref 80.0–100.0)
MPV: 9.4 fL (ref 7.5–12.5)
Platelets: 320 10*3/uL (ref 140–400)
RBC: 5.43 10*6/uL (ref 4.20–5.80)
RDW: 13.9 % (ref 11.0–15.0)
WBC: 4.5 10*3/uL (ref 3.8–10.8)

## 2022-08-23 LAB — RPR: RPR Ser Ql: NONREACTIVE

## 2022-08-23 MED ORDER — DOXYCYCLINE HYCLATE 100 MG PO TABS: 100.0000 mg | ORAL_TABLET | Freq: Two times a day (BID) | ORAL | 0 refills | Status: DC

## 2022-08-23 NOTE — Telephone Encounter (Signed)
Called Kurt Simpson regarding his rectal swab from a recent visit that was positive for chlamydia. I informed him I would be sending in a prescription for 7 days of doxycycline 100 mg PO BID. He was counseled on side effects of the medication including GI upset which may be mitigated by taking the medication with food, the potential for photosensitivity, and the potential for esophagitis, which may be mitigated by taking the medication with a full glass of water and staying upright for 30 minutes after taking the doxycycline. I further counseled him to abstain from sexual activity for 7 days after completing the course of doxycycline . He voiced understanding to all counseling and asked the medication be sent to the [facility] on [facility].    Jani Gravel, PharmD PGY-2 Infectious Diseases Resident  08/23/2022 2:00 PM

## 2022-08-28 ENCOUNTER — Other Ambulatory Visit (HOSPITAL_COMMUNITY): Payer: Self-pay

## 2022-10-11 ENCOUNTER — Other Ambulatory Visit (HOSPITAL_COMMUNITY): Payer: Self-pay

## 2022-10-11 ENCOUNTER — Other Ambulatory Visit: Payer: Self-pay | Admitting: Pharmacist

## 2022-10-11 DIAGNOSIS — B20 Human immunodeficiency virus [HIV] disease: Secondary | ICD-10-CM

## 2022-10-11 MED ORDER — CABOTEGRAVIR & RILPIVIRINE ER 600 & 900 MG/3ML IM SUER
1.0000 | INTRAMUSCULAR | 5 refills | Status: DC
  Filled 2022-10-11: qty 6, 60d supply, fill #0
  Filled 2022-10-14 (×2): qty 6, 30d supply, fill #0
  Filled 2022-12-05: qty 6, 56d supply, fill #1
  Filled 2023-10-09: qty 6, 30d supply, fill #1

## 2022-10-14 ENCOUNTER — Other Ambulatory Visit (HOSPITAL_COMMUNITY): Payer: Self-pay

## 2022-10-15 ENCOUNTER — Other Ambulatory Visit (HOSPITAL_COMMUNITY): Payer: Self-pay

## 2022-10-15 ENCOUNTER — Telehealth: Payer: Self-pay

## 2022-10-15 NOTE — Telephone Encounter (Signed)
[facility] Patient Advocate Encounter  Patient's medication Kern Reap) have been couriered to [facility] from [facility] and will be administered on the patient next office visit on 10/22/22.  Ileene Patrick , [facility] Patient [facility] Phone: [phone] Fax:  [phone]

## 2022-10-22 ENCOUNTER — Other Ambulatory Visit: Payer: Self-pay

## 2022-10-22 ENCOUNTER — Ambulatory Visit (INDEPENDENT_AMBULATORY_CARE_PROVIDER_SITE_OTHER): Payer: 59 | Admitting: Pharmacist

## 2022-10-22 ENCOUNTER — Other Ambulatory Visit (HOSPITAL_COMMUNITY)
Admission: RE | Admit: 2022-10-22 | Discharge: 2022-10-22 | Disposition: A | Discharge status: Home / Self Care | Payer: 59 | Source: Ambulatory Visit | Attending: Infectious Diseases | Admitting: Infectious Diseases

## 2022-10-22 DIAGNOSIS — Z113 Encounter for screening for infections with a predominantly sexual mode of transmission: Secondary | ICD-10-CM | POA: Insufficient documentation

## 2022-10-22 DIAGNOSIS — B20 Human immunodeficiency virus [HIV] disease: Secondary | ICD-10-CM | POA: Diagnosis not present

## 2022-10-22 MED ORDER — CABOTEGRAVIR & RILPIVIRINE ER 600 & 900 MG/3ML IM SUER
1.0000 | Freq: Once | INTRAMUSCULAR | Status: AC
  Administered 2022-10-22: 1 via INTRAMUSCULAR

## 2022-10-22 NOTE — Progress Notes (Signed)
HPI: Kurt Simpson is a 27 y.o. male who presents to the [facility] pharmacy clinic for [place] administration.  Patient Active Problem List   Diagnosis Date Noted   Routine screening for STI (sexually transmitted infection) 03/29/2019   Asymptomatic HIV infection (HCC) 07/30/2018   Vitamin D deficiency 11/25/2009   Allergic rhinitis 01/17/2009    Patient's Medications  New Prescriptions   No medications on file  Previous Medications   CABOTEGRAVIR & RILPIVIRINE ER (CABENUVA) 600 & 900 MG/3ML INJECTION    Inject 1 kit into the muscle every 2 (two) months.   DOXYCYCLINE (VIBRA-TABS) 100 MG TABLET    Take 1 tablet (100 mg total) by mouth 2 (two) times daily.  Modified Medications   No medications on file  Discontinued Medications   No medications on file    Allergies: No Known Allergies  Past Medical History: Past Medical History:  Diagnosis Date   Allergy    Enlarged tonsils 04/25/2016   HIV infection (HCC)    Mononucleosis    Palpitations    Vitamin D deficiency     Social History: Social History   Socioeconomic History   Marital status: Single    Spouse name: Not on file   Number of children: 0   Years of education: Not on file   Highest education level: Not on file  Occupational History   Not on file  Tobacco Use   Smoking status: Never   Smokeless tobacco: Never  Vaping Use   Vaping Use: Never used  Substance and Sexual Activity   Alcohol use: Not Currently    Comment: occ   Drug use: Not Currently    Types: Marijuana   Sexual activity: Yes    Partners: Male    Birth control/protection: Condom  Other Topics Concern   Not on file  Social History Narrative   He lives with his mother, step-dad , younger sister.    He was taking classes a [employer], but has taken some time off.    Social Determinants of Health   Financial Resource Strain: Low Risk  (07/30/2022)   Overall Financial Resource Strain (CARDIA)    Difficulty of Paying Living Expenses: Not hard  at all  Food Insecurity: No Food Insecurity (07/30/2022)   Hunger Vital Sign    Worried About Running Out of Food in the Last Year: Never true    Ran Out of Food in the Last Year: Never true  Transportation Needs: No Transportation Needs (07/30/2022)   PRAPARE - Administrator, Civil Service (Medical): No    Lack of Transportation (Non-Medical): No  Physical Activity: Sufficiently Active (07/30/2022)   Exercise Vital Sign    Days of Exercise per Week: 5 days    Minutes of Exercise per Session: 80 min  Stress: No Stress Concern Present (07/30/2022)   [employer] of Occupational Health - Occupational Stress Questionnaire    Feeling of Stress : Not at all  Social Connections: Moderately Integrated (07/30/2022)   Social Connection and Isolation Panel [NHANES]    Frequency of Communication with Friends and Family: More than three times a week    Frequency of Social Gatherings with Friends and Family: Once a week    Attends Religious Services: More than 4 times per year    Active Member of [employer] or Organizations: Yes    Attends Engineer, structural: More than 4 times per year    Marital Status: Never married    Labs: Lab Results  Component Value Date   HIV1RNAQUANT Not Detected 08/20/2022   HIV1RNAQUANT Not Detected 04/17/2022   HIV1RNAQUANT NOT DETECTED 02/13/2022   CD4TABS 497 08/20/2022   CD4TABS 513 06/07/2021   CD4TABS 415 11/27/2020    RPR and STI Lab Results  Component Value Date   LABRPR NON-REACTIVE 08/20/2022   LABRPR NON-REACTIVE 04/17/2022   LABRPR NON-REACTIVE 12/18/2021   LABRPR NON-REACTIVE 09/18/2021   LABRPR NON-REACTIVE 06/07/2021    STI Results GC CT  08/20/2022  9:29 AM Negative    Negative    Negative  Positive    Negative    Negative   06/24/2022  9:43 AM Negative    Positive    Negative  Negative    Negative    Negative   04/17/2022  9:34 AM Negative    Negative    Negative  Negative    Negative    Negative    02/13/2022  9:38 AM Negative    Negative    Negative  Negative    Negative    Negative   10/17/2021 10:22 AM Negative    Negative    Negative  Negative    Negative    Negative   09/18/2021  2:34 PM Negative  Negative   09/18/2021  2:33 PM Negative    Negative  Negative    Negative   06/07/2021  3:12 PM Negative  Negative   06/07/2021  3:11 PM Negative    Negative  Negative    Negative   05/02/2021 12:03 PM Negative    Negative  Negative    Negative   05/02/2021 11:48 AM Negative  Negative   03/12/2021 10:38 AM Negative    Negative  Negative    Negative   03/12/2021 10:37 AM Negative  Negative   12/12/2020 10:56 AM Negative    Negative    Negative  Negative    Negative    Negative   08/03/2020 11:05 AM Positive  Negative   08/03/2020 11:04 AM Negative  Positive   07/21/2020 11:00 AM Negative  Negative   05/09/2020 11:04 AM Negative  Positive   05/09/2020 11:03 AM Negative  Negative   05/09/2020 11:01 AM Positive  Negative     Hepatitis B Lab Results  Component Value Date   HEPBSAB REACTIVE (A) 03/29/2019   HEPBSAG NON-REACTIVE 07/15/2018   HEPBCAB NON-REACTIVE 07/15/2018   Hepatitis C Lab Results  Component Value Date   HEPCAB NON-REACTIVE 07/15/2018   Hepatitis A Lab Results  Component Value Date   HAV REACTIVE (A) 07/15/2018   Lipids: Lab Results  Component Value Date   CHOL 141 05/02/2021   TRIG 51 05/02/2021   HDL 53 05/02/2021   CHOLHDL 2.7 05/02/2021   LDLCALC 75 05/02/2021    TARGET DATE:  The 16th of the month  Current HIV Regimen: Cabenuva  Assessment: Walker presents today for their maintenance Cabenuva injections. Initial/past injections were tolerated well without issues. No problems with systemic effects of injections.   Administered cabotegravir 600mg /83mL in left upper outer quadrant of the gluteal muscle. Administered rilpivirine 900 mg/25mL in the right upper outer quadrant of the gluteal muscle. Monitored patient for 10 minutes  after injection. Injections were tolerated well without issue. Patient will follow up in 2 months for next injection. Will defer HIV RNA until next visit per Stephanie's recommendation to assess every 4 months.  He requests urine/oral/rectal cytologies today. Briefly discussed doxyPEP and how he could potentially be a candidate given lack of condom use "in the  spur of the moment". He states he uses condoms 90% of the time and accepted some during our visit today, but he has tested positive for a couple STIs recently. Stated that I would defer to [place].   He politely declines COVID vaccination today and defers Menveo until his next appointment.   Plan: - Cabenuva injections administered - Check urine/oral/rectal cytologies - Next injections scheduled for 3/12 with Colletta Maryland and 5/9 with me  - Call with any issues or questions  Alfonse Spruce, PharmD, CPP, BCIDP, [place] Clinical Pharmacist Practitioner Infectious Diseases [place] [facility]

## 2022-10-23 ENCOUNTER — Other Ambulatory Visit: Payer: Self-pay | Admitting: Pharmacist

## 2022-10-23 DIAGNOSIS — A749 Chlamydial infection, unspecified: Secondary | ICD-10-CM

## 2022-10-23 LAB — URINE CYTOLOGY ANCILLARY ONLY
Chlamydia: NEGATIVE
Comment: NEGATIVE
Comment: NORMAL
Neisseria Gonorrhea: NEGATIVE

## 2022-10-23 LAB — CYTOLOGY, (ORAL, ANAL, URETHRAL) ANCILLARY ONLY
Chlamydia: NEGATIVE
Chlamydia: POSITIVE — AB
Comment: NEGATIVE
Comment: NEGATIVE
Comment: NORMAL
Comment: NORMAL
Neisseria Gonorrhea: NEGATIVE
Neisseria Gonorrhea: NEGATIVE

## 2022-10-23 MED ORDER — DOXYCYCLINE HYCLATE 100 MG PO TABS: 100.0000 mg | ORAL_TABLET | Freq: Two times a day (BID) | ORAL | 0 refills | Status: DC

## 2022-10-23 NOTE — Progress Notes (Signed)
Patient's oral cytologies resulted positive for chlamydia just as in November. Sent doxycycline 100mg  BID x 7 days to [facility]. Estill Bamberg

## 2022-10-23 NOTE — Progress Notes (Signed)
No he is not - I reviewed it with him yesterday since he has had a recent string of STIs due to him not using condoms "in the spur of the moment". I will follow up with him in a couple weeks once he has completed his doxycycline and send in doxyPEP script. Thanks Colletta Maryland!

## 2022-11-06 ENCOUNTER — Encounter: Payer: Self-pay | Admitting: Pharmacist

## 2022-12-04 ENCOUNTER — Other Ambulatory Visit (HOSPITAL_COMMUNITY): Payer: Self-pay

## 2022-12-05 ENCOUNTER — Other Ambulatory Visit (HOSPITAL_COMMUNITY): Payer: Self-pay

## 2022-12-09 ENCOUNTER — Other Ambulatory Visit: Payer: Self-pay

## 2022-12-11 ENCOUNTER — Other Ambulatory Visit (HOSPITAL_COMMUNITY): Payer: Self-pay

## 2022-12-12 ENCOUNTER — Telehealth: Payer: Self-pay

## 2022-12-12 ENCOUNTER — Other Ambulatory Visit (HOSPITAL_COMMUNITY): Payer: Self-pay

## 2022-12-12 ENCOUNTER — Other Ambulatory Visit: Payer: Self-pay

## 2022-12-12 NOTE — Telephone Encounter (Signed)
[facility] Patient Advocate Encounter  I called [employer] @ [phone] J Code [id number] & [phone] is valid & billable , does not require PA .  REF # [id number] Patient will have a $35.00 office visit copay   Patient is enrolled in [place] Patient [facility] for Infectious Disease Phone: [phone] Fax:  [phone]

## 2022-12-13 ENCOUNTER — Other Ambulatory Visit (HOSPITAL_COMMUNITY): Payer: Self-pay

## 2022-12-17 ENCOUNTER — Other Ambulatory Visit: Payer: Self-pay

## 2022-12-17 ENCOUNTER — Ambulatory Visit (INDEPENDENT_AMBULATORY_CARE_PROVIDER_SITE_OTHER): Payer: 59 | Admitting: Infectious Diseases

## 2022-12-17 ENCOUNTER — Other Ambulatory Visit (HOSPITAL_COMMUNITY)
Admission: RE | Admit: 2022-12-17 | Discharge: 2022-12-17 | Disposition: A | Discharge status: Home / Self Care | Payer: 59 | Source: Ambulatory Visit | Attending: Infectious Diseases | Admitting: Infectious Diseases

## 2022-12-17 ENCOUNTER — Encounter: Payer: Self-pay | Admitting: Infectious Diseases

## 2022-12-17 VITALS — BP 106/69 | HR 75 | Resp 16 | Ht 70.5 in | Wt 143.0 lb

## 2022-12-17 DIAGNOSIS — Z113 Encounter for screening for infections with a predominantly sexual mode of transmission: Secondary | ICD-10-CM | POA: Diagnosis not present

## 2022-12-17 DIAGNOSIS — Z0000 Encounter for general adult medical examination without abnormal findings: Secondary | ICD-10-CM | POA: Insufficient documentation

## 2022-12-17 DIAGNOSIS — Z21 Asymptomatic human immunodeficiency virus [HIV] infection status: Secondary | ICD-10-CM | POA: Diagnosis not present

## 2022-12-17 MED ORDER — DOXYCYCLINE HYCLATE 100 MG PO TABS: 100.0000 mg | ORAL_TABLET | Freq: Two times a day (BID) | ORAL | 1 refills | Status: DC

## 2022-12-17 MED ORDER — CABOTEGRAVIR & RILPIVIRINE ER 600 & 900 MG/3ML IM SUER
1.0000 | Freq: Once | INTRAMUSCULAR | Status: AC
  Administered 2022-12-17: 1 via INTRAMUSCULAR

## 2022-12-17 NOTE — Addendum Note (Signed)
Addended by: Theresia Majors A on: 12/17/2022 11:04 AM   Modules accepted: Orders

## 2022-12-17 NOTE — Patient Instructions (Addendum)
  I would like to try a new way to prevent sexually transmitted infections for you with DOXY-PEP.  This has been shown to reduce syphilis and chlamydia re-infections by up to 70% in some studies.   How to take your antibiotic? Take TWO tablets (200 mg) of Doxycycline ONCE with food and a full glass of water within 24 hours (no later than 72 hours) after condomless sex (oral or rectal).    Next appointment with pharmacy 02/13/2023   [place] Medicaid Expansion Flyer      How To Apply for Medicaid?

## 2022-12-17 NOTE — Assessment & Plan Note (Signed)
Due for menveo vaccine booster - deferred until next visit.

## 2022-12-17 NOTE — Assessment & Plan Note (Addendum)
Asymptomatic urine and extragenital screening today.  Discussed Doxy Pep and rx sent in.

## 2022-12-17 NOTE — Progress Notes (Signed)
Name: Kurt Simpson  DOB: [birth year] MRN: [id number] PCP: Steele Sizer, MD    Brief Narrative:  Kurt Simpson is a 27 y.o. male with HIV infection diagnosed in October 2019.  History of OIs: none.  HIV Risk: MSM/bisexual.  CD4 nadir 310/VL 263,000.  [id number] (-), Quantiferon (-). Hep B sAg (-)  Previous Regimens: Dovato --> suppressed  Cabenuva, 09/18/2021  Genotypes: 07/2018 - no mutations    Subjective:   Chief Complaint  Patient presents with   Follow-up    Cab today-Declines vaccines. Will drop off moms insurance on 12/23/22. Advised to bring check stub in and see finance before next injection visit so he can apply for RW. Butch Penny did get approval or ViiV to help cover meds.      HPI:  Kurt Simpson is here today for follow-up HIV care and Cabenuva administration.  He continues to do very well on these injections and would like to continue them for treatment. No new medications or health conditions since her last office visit together. Had wisdom teeth removed, right lower one may have a little dry socket in there.  All other 3 sites have healed over.  Removal was a week ago.  No concern over anxious/depressed mood. Sleeping and eating well. Requested STI screening tests today.  Wants to know more about Doxy Pep.     Review of Systems  Constitutional:  Negative for chills and fever.  HENT:  Negative for sore throat.   Eyes:  Negative for visual disturbance.  Gastrointestinal:  Negative for abdominal pain, anal bleeding and rectal pain.  Genitourinary:  Negative for dysuria, genital sores, penile discharge, penile pain, scrotal swelling and testicular pain.  Musculoskeletal:  Negative for arthralgias and joint swelling.  Skin:  Negative for rash.  Neurological:  Negative for headaches.  Hematological:  Negative for adenopathy.     Past Medical History:  Diagnosis Date   Allergy    Enlarged tonsils 04/25/2016   HIV infection ([place])    Mononucleosis     Palpitations    Vitamin D deficiency     Outpatient Medications Prior to Visit  Medication Sig Dispense Refill   acetaminophen (TYLENOL) 325 MG tablet Take 650 mg by mouth every 6 (six) hours as needed.     cabotegravir & rilpivirine ER (CABENUVA) 600 & 900 MG/3ML injection Inject 1 kit into the muscle every 2 (two) months. 6 mL 5   doxycycline (VIBRA-TABS) 100 MG tablet Take 1 tablet (100 mg total) by mouth 2 (two) times daily. 14 tablet 0   No facility-administered medications prior to visit.    No Known Allergies  Social History   Tobacco Use   Smoking status: Never    Passive exposure: Never   Smokeless tobacco: Never  Vaping Use   Vaping Use: Never used  Substance Use Topics   Alcohol use: Not Currently    Comment: occ   Drug use: Not Currently    Types: Marijuana    Social History   Substance and Sexual Activity  Sexual Activity Yes   Partners: Male   Birth control/protection: Condom     Objective:   Vitals:   12/17/22 1000  BP: 106/69  Pulse: 75  Resp: 16  SpO2: 98%  Weight: 143 lb (64.9 kg)  Height: 5' 10.5" (1.791 m)   Body mass index is 20.23 kg/m.  Physical Exam Constitutional:      Appearance: Normal appearance. He is not ill-appearing.  HENT:     Head: Normocephalic.  Mouth/Throat:     Mouth: Mucous membranes are moist.     Pharynx: Oropharynx is clear.  Eyes:     General: No scleral icterus. Pulmonary:     Effort: Pulmonary effort is normal.  Musculoskeletal:        General: Normal range of motion.     Cervical back: Normal range of motion.  Skin:    Coloration: Skin is not jaundiced or pale.  Neurological:     Mental Status: He is alert and oriented to person, place, and time.  Psychiatric:        Mood and Affect: Mood normal.        Judgment: Judgment normal.      Lab Results Lab Results  Component Value Date   WBC 4.5 08/20/2022   HGB 13.8 08/20/2022   HCT 41.9 08/20/2022   MCV 77.2 (L) 08/20/2022   PLT 320  08/20/2022    Lab Results  Component Value Date   CREATININE 0.83 08/20/2022   BUN 13 08/20/2022   NA 140 08/20/2022   K 4.3 08/20/2022   CL 104 08/20/2022   CO2 29 08/20/2022    Lab Results  Component Value Date   ALT 12 08/20/2022   AST 12 08/20/2022   ALKPHOS 67 05/10/2018   BILITOT 0.3 08/20/2022    Lab Results  Component Value Date   CHOL 141 05/02/2021   HDL 53 05/02/2021   LDLCALC 75 05/02/2021   TRIG 51 05/02/2021   CHOLHDL 2.7 05/02/2021   HIV 1 RNA Quant  Date Value  08/20/2022 Not Detected Copies/mL  04/17/2022 Not Detected Copies/mL  02/13/2022 NOT DETECTED copies/mL   CD4 T Cell Abs (/uL)  Date Value  08/20/2022 497  06/07/2021 513  11/27/2020 415     Assessment & Plan:   Problem List Items Addressed This Visit       Unprioritized   Asymptomatic HIV infection ([place]) (Chronic)    Well-controlled on every 2 month Cabenuva injections.  He has had a good track record of suppressed viral loads.  We both feel comfortable spacing out blood work to twice a year..  He has a birthday coming up soon and will lose current insurance through his mother's policy.  He is exploring other options right now.  Our pharmacy team has been helping with medication assistance through [place].  Will meet with our financial team about other options.  I also gave him information to see if he is a candidate for [place] Medicaid today. Due for Menveo booster but deferred until next office visit  Completed HPV series.  Discussed new recommendations surrounding into rectal cancer screening at 83.   02/13/2023 will be his next appointment for injections with our pharmacy team       Routine screening for STI (sexually transmitted infection)    Asymptomatic urine and extragenital screening today.  Discussed Doxy Pep and rx sent in.       Relevant Medications   doxycycline (VIBRA-TABS) 100 MG tablet   Health care maintenance    Due for menveo vaccine booster - deferred until next visit.        Other Visit Diagnoses     Screening for STDs (sexually transmitted diseases)    -  Primary   Relevant Medications   doxycycline (VIBRA-TABS) 100 MG tablet   Other Relevant Orders   Cytology (oral, anal, urethral) ancillary only   Urine cytology ancillary only   Cytology (oral, anal, urethral) ancillary only  Janene Madeira, MSN, NP-C [facility] for Infectious Disease [email or web address] Pager: [phone] Office: [phone] [facility] Main Line: [phone]   12/17/22  10:31 AM

## 2022-12-17 NOTE — Assessment & Plan Note (Signed)
Well-controlled on every 2 month Cabenuva injections.  He has had a good track record of suppressed viral loads.  We both feel comfortable spacing out blood work to twice a year..  He has a birthday coming up soon and will lose current insurance through his mother's policy.  He is exploring other options right now.  Our pharmacy team has been helping with medication assistance through [place].  Will meet with our financial team about other options.  I also gave him information to see if he is a candidate for [place] Medicaid today. Due for Menveo booster but deferred until next office visit  Completed HPV series.  Discussed new recommendations surrounding into rectal cancer screening at 8.   02/13/2023 will be his next appointment for injections with our pharmacy team

## 2022-12-18 LAB — CYTOLOGY, (ORAL, ANAL, URETHRAL) ANCILLARY ONLY
Chlamydia: NEGATIVE
Chlamydia: NEGATIVE
Comment: NEGATIVE
Comment: NEGATIVE
Comment: NORMAL
Comment: NORMAL
Neisseria Gonorrhea: NEGATIVE
Neisseria Gonorrhea: POSITIVE — AB

## 2022-12-18 LAB — URINE CYTOLOGY ANCILLARY ONLY
Chlamydia: NEGATIVE
Comment: NEGATIVE
Comment: NORMAL
Neisseria Gonorrhea: NEGATIVE

## 2022-12-19 ENCOUNTER — Telehealth: Payer: Self-pay

## 2022-12-19 NOTE — Telephone Encounter (Signed)
-----   Message from Pacolet Callas, NP sent at 12/18/2022  6:08 PM EDT ----- Please give Kurt Simpson a call to get 500 mg I'm Ceftriaxone tx arranged for gonorrhea.  Thank you!

## 2022-12-19 NOTE — Telephone Encounter (Signed)
-----   Message from Stephanie N Dixon, NP sent at 12/18/2022  6:08 PM EDT ----- Please give Jayvien a call to get 500 mg I'm Ceftriaxone tx arranged for gonorrhea.  Thank you! 

## 2022-12-19 NOTE — Telephone Encounter (Signed)
Spoke to patient to schedule patient for an appointment for treatment for gonorrhea. Patient stated he seen mychart message from [facility] regarding positive results. Patient scheduled for tomorrow for treatment. [place] Dinneen T Brooks Sailors

## 2022-12-19 NOTE — Telephone Encounter (Signed)
Patient needs appointment for gonorrhea treatment. Patient has read Mychart message with results and treatment needed. Send patient another Mychart to contact our office for treatment.   Kurt Mcalpine, LPN

## 2022-12-20 ENCOUNTER — Ambulatory Visit (INDEPENDENT_AMBULATORY_CARE_PROVIDER_SITE_OTHER): Payer: 59

## 2022-12-20 ENCOUNTER — Other Ambulatory Visit: Payer: Self-pay

## 2022-12-20 DIAGNOSIS — Z202 Contact with and (suspected) exposure to infections with a predominantly sexual mode of transmission: Secondary | ICD-10-CM | POA: Diagnosis not present

## 2022-12-20 MED ORDER — CEFTRIAXONE SODIUM 500 MG IJ SOLR
500.0000 mg | Freq: Once | INTRAMUSCULAR | Status: AC
  Administered 2022-12-20: 500 mg via INTRAMUSCULAR

## 2022-12-20 MED ORDER — DOXYCYCLINE HYCLATE 100 MG PO TABS: 200.0000 mg | ORAL_TABLET | ORAL | 2 refills | Status: DC

## 2023-02-11 ENCOUNTER — Telehealth: Payer: Self-pay

## 2023-02-11 ENCOUNTER — Other Ambulatory Visit (HOSPITAL_COMMUNITY): Payer: Self-pay

## 2023-02-11 NOTE — Telephone Encounter (Signed)
Pharmacy Patient Advocate Encounter- Kurt Simpson BIV-Medical Benefit:  J code: [id number]  CPT code: 91478  Dx Code: B20  Patient brought in new ID # [id number] for [facility].

## 2023-02-12 NOTE — Progress Notes (Unsigned)
HPI: Kurt Simpson is a 27 y.o. male who presents to the [facility] pharmacy clinic for [place] administration.  Patient Active Problem List   Diagnosis Date Noted   Health care maintenance 12/17/2022   Routine screening for STI (sexually transmitted infection) 03/29/2019   Asymptomatic HIV infection (HCC) 07/30/2018   Vitamin D deficiency 11/25/2009   Allergic rhinitis 01/17/2009    Patient's Medications  New Prescriptions   No medications on file  Previous Medications   ACETAMINOPHEN (TYLENOL) 325 MG TABLET    Take 650 mg by mouth every 6 (six) hours as needed.   CABOTEGRAVIR & RILPIVIRINE ER (CABENUVA) 600 & 900 MG/3ML INJECTION    Inject 1 kit into the muscle every 2 (two) months.   DOXYCYCLINE (VIBRA-TABS) 100 MG TABLET    Take 2 tablets (200 mg total) by mouth See admin instructions. Within 24-72 hours of unprotected sex  Modified Medications   No medications on file  Discontinued Medications   No medications on file    Allergies: No Known Allergies  Past Medical History: Past Medical History:  Diagnosis Date   Allergy    Enlarged tonsils 04/25/2016   HIV infection (HCC)    Mononucleosis    Palpitations    Vitamin D deficiency     Social History: Social History   Socioeconomic History   Marital status: Single    Spouse name: Not on file   Number of children: 0   Years of education: Not on file   Highest education level: Not on file  Occupational History   Not on file  Tobacco Use   Smoking status: Never    Passive exposure: Never   Smokeless tobacco: Never  Vaping Use   Vaping Use: Never used  Substance and Sexual Activity   Alcohol use: Not Currently    Comment: occ   Drug use: Not Currently    Types: Marijuana   Sexual activity: Yes    Partners: Male    Birth control/protection: Condom  Other Topics Concern   Not on file  Social History Narrative   He lives with his mother, step-dad , younger sister.    He was taking classes a ACC, but has  taken some time off.    Social Determinants of Health   Financial Resource Strain: Low Risk  (07/30/2022)   Overall Financial Resource Strain (CARDIA)    Difficulty of Paying Living Expenses: Not hard at all  Food Insecurity: No Food Insecurity (07/30/2022)   Hunger Vital Sign    Worried About Running Out of Food in the Last Year: Never true    Ran Out of Food in the Last Year: Never true  Transportation Needs: No Transportation Needs (07/30/2022)   PRAPARE - Administrator, Civil Service (Medical): No    Lack of Transportation (Non-Medical): No  Physical Activity: Sufficiently Active (07/30/2022)   Exercise Vital Sign    Days of Exercise per Week: 5 days    Minutes of Exercise per Session: 80 min  Stress: No Stress Concern Present (07/30/2022)   [employer] of Occupational Health - Occupational Stress Questionnaire    Feeling of Stress : Not at all  Social Connections: Moderately Integrated (07/30/2022)   Social Connection and Isolation Panel [NHANES]    Frequency of Communication with Friends and Family: More than three times a week    Frequency of Social Gatherings with Friends and Family: Once a week    Attends Religious Services: More than 4 times per  year    Active Member of Clubs or Organizations: Yes    Attends Banker Meetings: More than 4 times per year    Marital Status: Never married    Labs: Lab Results  Component Value Date   HIV1RNAQUANT Not Detected 08/20/2022   HIV1RNAQUANT Not Detected 04/17/2022   HIV1RNAQUANT NOT DETECTED 02/13/2022   CD4TABS 497 08/20/2022   CD4TABS 513 06/07/2021   CD4TABS 415 11/27/2020    RPR and STI Lab Results  Component Value Date   LABRPR NON-REACTIVE 08/20/2022   LABRPR NON-REACTIVE 04/17/2022   LABRPR NON-REACTIVE 12/18/2021   LABRPR NON-REACTIVE 09/18/2021   LABRPR NON-REACTIVE 06/07/2021    STI Results GC CT  12/17/2022 10:21 AM Negative  Negative   12/17/2022 10:20 AM Negative   Negative   12/17/2022 10:18 AM Positive  Negative   10/22/2022  9:42 AM Negative    Negative    Negative  Negative    Positive    Negative   08/20/2022  9:29 AM Negative    Negative    Negative  Positive    Negative    Negative   06/24/2022  9:43 AM Negative    Positive    Negative  Negative    Negative    Negative   04/17/2022  9:34 AM Negative    Negative    Negative  Negative    Negative    Negative   02/13/2022  9:38 AM Negative    Negative    Negative  Negative    Negative    Negative   10/17/2021 10:22 AM Negative    Negative    Negative  Negative    Negative    Negative   09/18/2021  2:34 PM Negative  Negative   09/18/2021  2:33 PM Negative    Negative  Negative    Negative   06/07/2021  3:12 PM Negative  Negative   06/07/2021  3:11 PM Negative    Negative  Negative    Negative   05/02/2021 12:03 PM Negative    Negative  Negative    Negative   05/02/2021 11:48 AM Negative  Negative   03/12/2021 10:38 AM Negative    Negative  Negative    Negative   03/12/2021 10:37 AM Negative  Negative   12/12/2020 10:56 AM Negative    Negative    Negative  Negative    Negative    Negative   08/03/2020 11:05 AM Positive  Negative   08/03/2020 11:04 AM Negative  Positive     Hepatitis B Lab Results  Component Value Date   HEPBSAB REACTIVE (A) 03/29/2019   HEPBSAG NON-REACTIVE 07/15/2018   HEPBCAB NON-REACTIVE 07/15/2018   Hepatitis C Lab Results  Component Value Date   HEPCAB NON-REACTIVE 07/15/2018   Hepatitis A Lab Results  Component Value Date   HAV REACTIVE (A) 07/15/2018   Lipids: Lab Results  Component Value Date   CHOL 141 05/02/2021   TRIG 51 05/02/2021   HDL 53 05/02/2021   CHOLHDL 2.7 05/02/2021   LDLCALC 75 05/02/2021    TARGET DATE: the 16th  Assessment: Kurt Simpson presents today for his maintenance Cabenuva injections. Past injections were tolerated well without issues.  Last STI screening 12/17/22 positive for gonorrhea, which was  appropriately treated with IM ceftriaxone. He was also prescribed Doxy-PEP at his last appointment. He has not taken this medication as of yet. He denies any new sexual partners since last visit. He agrees to full STI testing today. He  is eligible for monkeypox vaccination, COVID-19 vaccination, and Menveo booster. He politely declines at this time.   Administered cabotegravir 600mg /37mL in left upper outer quadrant of the gluteal muscle. Administered rilpivirine 900 mg/59mL in the right upper outer quadrant of the gluteal muscle. No issues with injections. He will follow up in 2 months for next set of injections.  Plan: - Cabenuva injections administered - Check HIV-RNA viral load, RPR, and urine/oral/rectal cytologies  - Next injections scheduled for 04/17/23 with Margarite Gouge and 06/24/2023 with Rexene Alberts - Call with any issues or questions  Irish Elders, PharmD PGY-1 [facility] Pharmacy Resident

## 2023-02-13 ENCOUNTER — Other Ambulatory Visit: Payer: Self-pay

## 2023-02-13 ENCOUNTER — Other Ambulatory Visit (HOSPITAL_COMMUNITY)
Admission: RE | Admit: 2023-02-13 | Discharge: 2023-02-13 | Disposition: A | Discharge status: Home / Self Care | Payer: 59 | Source: Ambulatory Visit | Attending: Infectious Diseases | Admitting: Infectious Diseases

## 2023-02-13 ENCOUNTER — Ambulatory Visit (INDEPENDENT_AMBULATORY_CARE_PROVIDER_SITE_OTHER): Payer: 59 | Admitting: Pharmacist

## 2023-02-13 DIAGNOSIS — Z21 Asymptomatic human immunodeficiency virus [HIV] infection status: Secondary | ICD-10-CM | POA: Diagnosis not present

## 2023-02-13 DIAGNOSIS — Z113 Encounter for screening for infections with a predominantly sexual mode of transmission: Secondary | ICD-10-CM | POA: Insufficient documentation

## 2023-02-13 MED ORDER — CABOTEGRAVIR & RILPIVIRINE ER 600 & 900 MG/3ML IM SUER
1.0000 | Freq: Once | INTRAMUSCULAR | Status: AC
  Administered 2023-02-13: 1 via INTRAMUSCULAR

## 2023-02-14 LAB — URINE CYTOLOGY ANCILLARY ONLY
Chlamydia: NEGATIVE
Comment: NEGATIVE
Comment: NORMAL
Neisseria Gonorrhea: NEGATIVE

## 2023-02-14 LAB — CYTOLOGY, (ORAL, ANAL, URETHRAL) ANCILLARY ONLY
Chlamydia: NEGATIVE
Chlamydia: NEGATIVE
Comment: NEGATIVE
Comment: NEGATIVE
Comment: NORMAL
Comment: NORMAL
Neisseria Gonorrhea: NEGATIVE
Neisseria Gonorrhea: NEGATIVE

## 2023-02-15 LAB — HIV-1 RNA QUANT-NO REFLEX-BLD
HIV 1 RNA Quant: NOT DETECTED Copies/mL
HIV-1 RNA Quant, Log: NOT DETECTED Log cps/mL

## 2023-02-15 LAB — RPR: RPR Ser Ql: NONREACTIVE

## 2023-04-17 ENCOUNTER — Other Ambulatory Visit: Payer: Self-pay

## 2023-04-17 ENCOUNTER — Encounter: Payer: Self-pay | Admitting: Pharmacist

## 2023-04-17 ENCOUNTER — Ambulatory Visit: Payer: 59 | Admitting: Pharmacist

## 2023-04-17 VITALS — Resp 16 | Ht 70.5 in

## 2023-04-17 DIAGNOSIS — Z202 Contact with and (suspected) exposure to infections with a predominantly sexual mode of transmission: Secondary | ICD-10-CM | POA: Diagnosis not present

## 2023-04-17 DIAGNOSIS — B20 Human immunodeficiency virus [HIV] disease: Secondary | ICD-10-CM

## 2023-04-17 DIAGNOSIS — Z21 Asymptomatic human immunodeficiency virus [HIV] infection status: Secondary | ICD-10-CM

## 2023-04-17 MED ORDER — CEFTRIAXONE SODIUM 500 MG IJ SOLR
500.0000 mg | Freq: Once | INTRAMUSCULAR | Status: AC
  Administered 2023-04-17: 500 mg via INTRAMUSCULAR

## 2023-04-17 MED ORDER — CABOTEGRAVIR & RILPIVIRINE ER 600 & 900 MG/3ML IM SUER
1.0000 | Freq: Once | INTRAMUSCULAR | Status: AC
  Administered 2023-04-17: 1 via INTRAMUSCULAR

## 2023-04-17 NOTE — Progress Notes (Signed)
HPI: Kurt Simpson is a 27 y.o. male who presents to the [facility] pharmacy clinic for [place] administration.  Patient Active Problem List   Diagnosis Date Noted   Health care maintenance 12/17/2022   Routine screening for STI (sexually transmitted infection) 03/29/2019   Asymptomatic HIV infection (HCC) 07/30/2018   Vitamin D deficiency 11/25/2009   Allergic rhinitis 01/17/2009    Patient's Medications  New Prescriptions   No medications on file  Previous Medications   ACETAMINOPHEN (TYLENOL) 325 MG TABLET    Take 650 mg by mouth every 6 (six) hours as needed.   CABOTEGRAVIR & RILPIVIRINE ER (CABENUVA) 600 & 900 MG/3ML INJECTION    Inject 1 kit into the muscle every 2 (two) months.   DOXYCYCLINE (VIBRA-TABS) 100 MG TABLET    Take 2 tablets (200 mg total) by mouth See admin instructions. Within 24-72 hours of unprotected sex  Modified Medications   No medications on file  Discontinued Medications   No medications on file    Allergies: No Known Allergies  Past Medical History: Past Medical History:  Diagnosis Date   Allergy    Enlarged tonsils 04/25/2016   HIV infection (HCC)    Mononucleosis    Palpitations    Vitamin D deficiency     Social History: Social History   Socioeconomic History   Marital status: Single    Spouse name: Not on file   Number of children: 0   Years of education: Not on file   Highest education level: Not on file  Occupational History   Not on file  Tobacco Use   Smoking status: Never    Passive exposure: Never   Smokeless tobacco: Never  Vaping Use   Vaping status: Never Used  Substance and Sexual Activity   Alcohol use: Not Currently    Comment: occ   Drug use: Not Currently    Types: Marijuana   Sexual activity: Yes    Partners: Male    Birth control/protection: Condom  Other Topics Concern   Not on file  Social History Narrative   He lives with his mother, step-dad , younger sister.    He was taking classes a [employer], but has  taken some time off.    Social Determinants of Health   Financial Resource Strain: Low Risk  (07/30/2022)   Overall Financial Resource Strain (CARDIA)    Difficulty of Paying Living Expenses: Not hard at all  Food Insecurity: No Food Insecurity (07/30/2022)   Hunger Vital Sign    Worried About Running Out of Food in the Last Year: Never true    Ran Out of Food in the Last Year: Never true  Transportation Needs: No Transportation Needs (07/30/2022)   PRAPARE - Administrator, Civil Service (Medical): No    Lack of Transportation (Non-Medical): No  Physical Activity: Sufficiently Active (07/30/2022)   Exercise Vital Sign    Days of Exercise per Week: 5 days    Minutes of Exercise per Session: 80 min  Stress: No Stress Concern Present (07/30/2022)   [employer] of Occupational Health - Occupational Stress Questionnaire    Feeling of Stress : Not at all  Social Connections: Moderately Integrated (07/30/2022)   Social Connection and Isolation Panel [NHANES]    Frequency of Communication with Friends and Family: More than three times a week    Frequency of Social Gatherings with Friends and Family: Once a week    Attends Religious Services: More than 4 times per  year    Active Member of Clubs or Organizations: Yes    Attends Banker Meetings: More than 4 times per year    Marital Status: Never married    Labs: Lab Results  Component Value Date   HIV1RNAQUANT Not Detected 02/13/2023   HIV1RNAQUANT Not Detected 08/20/2022   HIV1RNAQUANT Not Detected 04/17/2022   CD4TABS 497 08/20/2022   CD4TABS 513 06/07/2021   CD4TABS 415 11/27/2020    RPR and STI Lab Results  Component Value Date   LABRPR NON-REACTIVE 02/13/2023   LABRPR NON-REACTIVE 08/20/2022   LABRPR NON-REACTIVE 04/17/2022   LABRPR NON-REACTIVE 12/18/2021   LABRPR NON-REACTIVE 09/18/2021    STI Results GC CT  02/13/2023  9:57 AM Negative    Negative    Negative  Negative    Negative     Negative   12/17/2022 10:21 AM Negative  Negative   12/17/2022 10:20 AM Negative  Negative   12/17/2022 10:18 AM Positive  Negative   10/22/2022  9:42 AM Negative    Negative    Negative  Negative    Positive    Negative   08/20/2022  9:29 AM Negative    Negative    Negative  Positive    Negative    Negative   06/24/2022  9:43 AM Negative    Positive    Negative  Negative    Negative    Negative   04/17/2022  9:34 AM Negative    Negative    Negative  Negative    Negative    Negative   02/13/2022  9:38 AM Negative    Negative    Negative  Negative    Negative    Negative   10/17/2021 10:22 AM Negative    Negative    Negative  Negative    Negative    Negative   09/18/2021  2:34 PM Negative  Negative   09/18/2021  2:33 PM Negative    Negative  Negative    Negative   06/07/2021  3:12 PM Negative  Negative   06/07/2021  3:11 PM Negative    Negative  Negative    Negative   05/02/2021 12:03 PM Negative    Negative  Negative    Negative   05/02/2021 11:48 AM Negative  Negative   03/12/2021 10:38 AM Negative    Negative  Negative    Negative   03/12/2021 10:37 AM Negative  Negative   12/12/2020 10:56 AM Negative    Negative    Negative  Negative    Negative    Negative   08/03/2020 11:05 AM Positive  Negative     Hepatitis B Lab Results  Component Value Date   HEPBSAB REACTIVE (A) 03/29/2019   HEPBSAG NON-REACTIVE 07/15/2018   HEPBCAB NON-REACTIVE 07/15/2018   Hepatitis C Lab Results  Component Value Date   HEPCAB NON-REACTIVE 07/15/2018   Hepatitis A Lab Results  Component Value Date   HAV REACTIVE (A) 07/15/2018   Lipids: Lab Results  Component Value Date   CHOL 141 05/02/2021   TRIG 51 05/02/2021   HDL 53 05/02/2021   CHOLHDL 2.7 05/02/2021   LDLCALC 75 05/02/2021    TARGET DATE:  The 16th of the month  Current HIV Regimen: Cabenuva  Assessment: Rihan presents today for their maintenance Cabenuva injections. Initial/past  injections were tolerated well without issues. No problems with systemic effects of injections.   Administered cabotegravir 600mg /38mL in left upper outer quadrant of the gluteal muscle. Administered rilpivirine 900  mg/49mL in the right upper outer quadrant of the gluteal muscle. Monitored patient for 10 minutes after injection. Injections were tolerated well without issue. Patient will follow up in 2 months for next injection. Will defer HIV RNA as was recently assessed in May.  Patient states he was told he was exposed to gonorrhea since his last appointment. Denies any symptoms and politely refuses STI testing today. Will empirically treat with ceftriaxone 500mg  x 1 for possible gonorrhea exposure. Did not discuss vaccines during his visit today.   Plan: - Cabenuva injections administered - Administer ceftriaxone 500 mg x 1  - Next injections scheduled for 9/17 with Judeth Cornfield and 11/12 with me  - Call with any issues or questions  Margarite Gouge, PharmD, CPP, BCIDP, AAHIVP Clinical Pharmacist Practitioner Infectious Diseases Clinical Pharmacist [facility]

## 2023-05-29 ENCOUNTER — Ambulatory Visit: Payer: 59 | Admitting: Nurse Practitioner

## 2023-05-29 ENCOUNTER — Encounter: Payer: Self-pay | Admitting: Nurse Practitioner

## 2023-05-29 ENCOUNTER — Other Ambulatory Visit (HOSPITAL_COMMUNITY): Admission: RE | Admit: 2023-05-29 | Payer: 59 | Source: Ambulatory Visit

## 2023-05-29 ENCOUNTER — Other Ambulatory Visit: Payer: Self-pay

## 2023-05-29 VITALS — BP 118/74 | HR 84 | Temp 98.1°F | Resp 18 | Ht 70.25 in | Wt 141.5 lb

## 2023-05-29 DIAGNOSIS — Z113 Encounter for screening for infections with a predominantly sexual mode of transmission: Secondary | ICD-10-CM | POA: Insufficient documentation

## 2023-05-29 NOTE — Progress Notes (Signed)
BP 118/74   Pulse 84   Temp 98.1 F (36.7 C) (Oral)   Resp 18   Ht 5' 10.25" (1.784 m)   Wt 141 lb 8 oz (64.2 kg)   SpO2 96%   BMI 20.16 kg/m    Subjective:    Patient ID: Kurt Simpson, male    DOB: 05-Nov-1995, 27 y.o.   MRN: [id number]  HPI: Kurt Simpson is a 27 y.o. male  Chief Complaint  Patient presents with   Exposure to STD    STD SCREENING Sexual activity:  Recent unprotected sexual encounter Contraception: no Recent unprotected intercourse: yes History of sexually transmitted diseases: yes Previous sexually transmitted disease screening: yes Lifetime sexual partners:  Genital lesions: no Penile discharge: no Dysuria: no Swollen lymph nodes: no Fevers: no Rash: no   He would like to be tested for gonorrhea and chlamydia declined hiv and rpr.   Relevant past medical, surgical, family and social history reviewed and updated as indicated. Interim medical history since our last visit reviewed. Allergies and medications reviewed and updated.  Review of Systems  Constitutional: Negative for fever or weight change.  Respiratory: Negative for cough and shortness of breath.   Cardiovascular: Negative for chest pain or palpitations.  Gastrointestinal: Negative for abdominal pain, no bowel changes.  Musculoskeletal: Negative for gait problem or joint swelling.  Skin: Negative for rash.  Neurological: Negative for dizziness or headache.  No other specific complaints in a complete review of systems (except as listed in HPI above).      Objective:    BP 118/74   Pulse 84   Temp 98.1 F (36.7 C) (Oral)   Resp 18   Ht 5' 10.25" (1.784 m)   Wt 141 lb 8 oz (64.2 kg)   SpO2 96%   BMI 20.16 kg/m   Wt Readings from Last 3 Encounters:  05/29/23 141 lb 8 oz (64.2 kg)  12/17/22 143 lb (64.9 kg)  07/30/22 138 lb 8 oz (62.8 kg)    Physical Exam  Constitutional: Patient appears well-developed and well-nourished.  No distress.  HEENT: head atraumatic,  normocephalic, pupils equal and reactive to light, neck supple, throat within normal limits Cardiovascular: Normal rate, regular rhythm and normal heart sounds.  No murmur heard. No BLE edema. Pulmonary/Chest: Effort normal and breath sounds normal. No respiratory distress. Abdominal: Soft.  There is no tenderness. Psychiatric: Patient has a normal mood and affect. behavior is normal. Judgment and thought content normal.  Results for orders placed or performed in visit on 02/13/23  RPR  Result Value Ref Range   RPR Ser Ql NON-REACTIVE NON-REACTIVE  HIV-1 RNA quant-no reflex-bld  Result Value Ref Range   HIV 1 RNA Quant Not Detected Copies/mL   HIV-1 RNA Quant, Log Not Detected Log cps/mL  Cytology (Rectal) ancillary only  Result Value Ref Range   Neisseria Gonorrhea Negative    Chlamydia Negative    Comment Normal Reference Ranger Chlamydia - Negative    Comment      Normal Reference Range Neisseria Gonorrhea - Negative  Cytology (Oral) ancillary only  Result Value Ref Range   Neisseria Gonorrhea Negative    Chlamydia Negative    Comment Normal Reference Ranger Chlamydia - Negative    Comment      Normal Reference Range Neisseria Gonorrhea - Negative  Urine cytology ancillary only  Result Value Ref Range   Neisseria Gonorrhea Negative    Chlamydia Negative    Comment Normal Reference Ranger Chlamydia -  Negative    Comment      Normal Reference Range Neisseria Gonorrhea - Negative      Assessment & Plan:   Problem List Items Addressed This Visit   None Visit Diagnoses     Screening for STD (sexually transmitted disease)    -  Primary   GC/chlmaydia testing ordered, urine, anal and oral recommend practicing safe sex, decllined rpr and hiv   Relevant Orders   Urine cytology ancillary only   Cytology (oral, anal, urethral) ancillary only   Cytology (oral, anal, urethral) ancillary only        Follow up plan: Return if symptoms worsen or fail to improve.

## 2023-06-02 ENCOUNTER — Other Ambulatory Visit: Payer: Self-pay | Admitting: Nurse Practitioner

## 2023-06-02 ENCOUNTER — Encounter: Payer: Self-pay | Admitting: Nurse Practitioner

## 2023-06-02 DIAGNOSIS — A749 Chlamydial infection, unspecified: Secondary | ICD-10-CM

## 2023-06-02 DIAGNOSIS — A549 Gonococcal infection, unspecified: Secondary | ICD-10-CM

## 2023-06-02 LAB — CYTOLOGY, (ORAL, ANAL, URETHRAL) ANCILLARY ONLY
Chlamydia: NEGATIVE
Chlamydia: POSITIVE — AB
Comment: NEGATIVE
Comment: NEGATIVE
Comment: NORMAL
Comment: NORMAL
Neisseria Gonorrhea: POSITIVE — AB
Neisseria Gonorrhea: POSITIVE — AB

## 2023-06-02 LAB — URINE CYTOLOGY ANCILLARY ONLY
Chlamydia: NEGATIVE
Comment: NEGATIVE
Comment: NEGATIVE
Comment: NORMAL
Neisseria Gonorrhea: NEGATIVE
Trichomonas: NEGATIVE

## 2023-06-02 NOTE — Progress Notes (Unsigned)
   There were no vitals taken for this visit.   Subjective:    Patient ID: Kurt Simpson, male    DOB: 1996/04/01, 27 y.o.   MRN: [id number]  HPI: Kurt Simpson is a 27 y.o. male  No chief complaint on file.  Gonorrhea/chlamydia: patient was seen on 05/29/2023 for sti screening. Patient was positive for gonorrhea and chlamydia oral swab and positive for gonorrhea in his anal swab.  Urine cytology was negative. Discussed safe sex practices with patient. Will treat with rocephin 500 mg IM and doxycycline 100 mg BID for 7 days. Return in 10 days for retesting.    Relevant past medical, surgical, family and social history reviewed and updated as indicated. Interim medical history since our last visit reviewed. Allergies and medications reviewed and updated.  Review of Systems  Per HPI unless specifically indicated above     Objective:    There were no vitals taken for this visit.  Wt Readings from Last 3 Encounters:  05/29/23 141 lb 8 oz (64.2 kg)  12/17/22 143 lb (64.9 kg)  07/30/22 138 lb 8 oz (62.8 kg)    Physical Exam  Results for orders placed or performed in visit on 05/29/23  Urine cytology ancillary only  Result Value Ref Range   Neisseria Gonorrhea Negative    Chlamydia Negative    Trichomonas Negative    Comment Normal Reference Ranger Chlamydia - Negative    Comment      Normal Reference Range Neisseria Gonorrhea - Negative   Comment Normal Reference Range Trichomonas - Negative   Cytology (oral, anal, urethral) ancillary only  Result Value Ref Range   Neisseria Gonorrhea Positive (A)    Chlamydia Positive (A)    Comment Normal Reference Ranger Chlamydia - Negative    Comment      Normal Reference Range Neisseria Gonorrhea - Negative  Cytology (oral, anal, urethral) ancillary only  Result Value Ref Range   Neisseria Gonorrhea Positive (A)    Chlamydia Negative    Comment Normal Reference Ranger Chlamydia - Negative    Comment      Normal Reference Range  Neisseria Gonorrhea - Negative      Assessment & Plan:   Problem List Items Addressed This Visit   None    Follow up plan: No follow-ups on file.

## 2023-06-03 ENCOUNTER — Other Ambulatory Visit (HOSPITAL_COMMUNITY)
Admission: RE | Admit: 2023-06-03 | Discharge: 2023-06-03 | Disposition: A | Discharge status: Home / Self Care | Payer: 59 | Source: Ambulatory Visit | Attending: Nurse Practitioner | Admitting: Nurse Practitioner

## 2023-06-03 ENCOUNTER — Other Ambulatory Visit: Payer: Self-pay

## 2023-06-03 ENCOUNTER — Encounter: Payer: Self-pay | Admitting: Nurse Practitioner

## 2023-06-03 ENCOUNTER — Ambulatory Visit (INDEPENDENT_AMBULATORY_CARE_PROVIDER_SITE_OTHER): Payer: 59 | Admitting: Nurse Practitioner

## 2023-06-03 VITALS — BP 118/72 | HR 88 | Temp 97.8°F | Resp 18 | Ht 70.25 in | Wt 141.0 lb

## 2023-06-03 DIAGNOSIS — A749 Chlamydial infection, unspecified: Secondary | ICD-10-CM | POA: Diagnosis present

## 2023-06-03 DIAGNOSIS — A549 Gonococcal infection, unspecified: Secondary | ICD-10-CM

## 2023-06-03 MED ORDER — CEFTRIAXONE SODIUM 500 MG IJ SOLR
500.0000 mg | Freq: Once | INTRAMUSCULAR | Status: AC
Start: 2023-06-03 — End: 2023-06-03
  Administered 2023-06-03: 500 mg via INTRAMUSCULAR

## 2023-06-03 MED ORDER — DOXYCYCLINE HYCLATE 100 MG PO TABS
100.0000 mg | ORAL_TABLET | Freq: Two times a day (BID) | ORAL | 0 refills | Status: AC
Start: 2023-06-03 — End: 2023-06-10

## 2023-06-13 LAB — CYTOLOGY, (ORAL, ANAL, URETHRAL) ANCILLARY ONLY
Chlamydia: NEGATIVE
Comment: NEGATIVE
Comment: NORMAL
Neisseria Gonorrhea: NEGATIVE

## 2023-06-24 ENCOUNTER — Ambulatory Visit (INDEPENDENT_AMBULATORY_CARE_PROVIDER_SITE_OTHER): Payer: 59 | Admitting: Infectious Diseases

## 2023-06-24 ENCOUNTER — Other Ambulatory Visit: Payer: Self-pay

## 2023-06-24 ENCOUNTER — Other Ambulatory Visit (HOSPITAL_COMMUNITY)
Admission: RE | Admit: 2023-06-24 | Discharge: 2023-06-24 | Disposition: A | Discharge status: Home / Self Care | Payer: 59 | Source: Ambulatory Visit | Attending: Infectious Diseases | Admitting: Infectious Diseases

## 2023-06-24 ENCOUNTER — Encounter: Payer: Self-pay | Admitting: Infectious Diseases

## 2023-06-24 VITALS — BP 121/73 | HR 88 | Temp 98.3°F | Resp 16 | Wt 133.2 lb

## 2023-06-24 DIAGNOSIS — Z113 Encounter for screening for infections with a predominantly sexual mode of transmission: Secondary | ICD-10-CM | POA: Diagnosis present

## 2023-06-24 DIAGNOSIS — Z21 Asymptomatic human immunodeficiency virus [HIV] infection status: Secondary | ICD-10-CM | POA: Diagnosis not present

## 2023-06-24 MED ORDER — CABOTEGRAVIR & RILPIVIRINE ER 600 & 900 MG/3ML IM SUER
1.0000 | Freq: Once | INTRAMUSCULAR | Status: AC
Start: 2023-06-24 — End: 2023-06-24
  Administered 2023-06-24: 1 via INTRAMUSCULAR

## 2023-06-24 NOTE — Progress Notes (Signed)
Name: Kurt Simpson  DOB: 12-02-1995 MRN: [id number] PCP: Alba Cory, MD    Brief Narrative:  Kurt Simpson is a 27 y.o. male with HIV infection diagnosed in October 2019.  History of OIs: none.  HIV Risk: MSM/bisexual.  CD4 nadir 310/VL 263,000.  [id number]^[id number] (-), Quantiferon (-). Hep B sAg (-)  Previous Regimens: Dovato --> suppressed  Cabenuva, 09/18/2021  Genotypes: 07/2018 - no mutations    Subjective:   Chief Complaint  Patient presents with   Follow-up    B20      HPI:  Kurt Simpson is here today for follow-up HIV care and Cabenuva administration.  He continues to do very well on these injections and would like to continue them for treatment. No new medications or health conditions since our last office visit together.  No concern over anxious/depressed mood. Sleeping and eating well. Requested STI screening tests today. One episode of unprotected sex. Tx for gonorrhea/chlamydia d/t contact exposure. Uses Doxy pep routinely - causes nausea if taken on empty stomach.   Deferred covid vaccine - uses masks and distancing. Plans for flu shot in October.     Review of Systems  Constitutional:  Negative for chills and fever.  HENT:  Negative for sore throat.   Eyes:  Negative for visual disturbance.  Gastrointestinal:  Negative for abdominal pain, anal bleeding and rectal pain.  Genitourinary:  Negative for dysuria, genital sores, penile discharge, penile pain, scrotal swelling and testicular pain.  Musculoskeletal:  Negative for arthralgias and joint swelling.  Skin:  Negative for rash.  Neurological:  Negative for headaches.  Hematological:  Negative for adenopathy.     Past Medical History:  Diagnosis Date   Allergy    Enlarged tonsils 04/25/2016   HIV infection (HCC)    Mononucleosis    Palpitations    Vitamin D deficiency     Outpatient Medications Prior to Visit  Medication Sig Dispense Refill   cabotegravir & rilpivirine ER (CABENUVA) 600 & 900  MG/3ML injection Inject 1 kit into the muscle every 2 (two) months. 6 mL 5   No facility-administered medications prior to visit.    No Known Allergies  Social History   Tobacco Use   Smoking status: Never    Passive exposure: Never   Smokeless tobacco: Never  Vaping Use   Vaping status: Never Used  Substance Use Topics   Alcohol use: Not Currently    Comment: occ   Drug use: Not Currently    Types: Marijuana    Social History   Substance and Sexual Activity  Sexual Activity Yes   Partners: Male   Birth control/protection: Condom   Comment: declined condoms     Objective:   Vitals:   06/24/23 0929  BP: 121/73  Pulse: 88  Resp: 16  Temp: 98.3 F (36.8 C)  TempSrc: Temporal  SpO2: 98%  Weight: 133 lb 3.2 oz (60.4 kg)   Body mass index is 18.98 kg/m.  Physical Exam Constitutional:      Appearance: Normal appearance. He is not ill-appearing.  HENT:     Head: Normocephalic.     Mouth/Throat:     Mouth: Mucous membranes are moist.     Pharynx: Oropharynx is clear.  Eyes:     General: No scleral icterus. Pulmonary:     Effort: Pulmonary effort is normal.  Musculoskeletal:        General: Normal range of motion.     Cervical back: Normal range of motion.  Skin:  Coloration: Skin is not jaundiced or pale.  Neurological:     Mental Status: He is alert and oriented to person, place, and time.  Psychiatric:        Mood and Affect: Mood normal.        Judgment: Judgment normal.      Lab Results Lab Results  Component Value Date   WBC 4.5 08/20/2022   HGB 13.8 08/20/2022   HCT 41.9 08/20/2022   MCV 77.2 (L) 08/20/2022   PLT 320 08/20/2022    Lab Results  Component Value Date   CREATININE 0.83 08/20/2022   BUN 13 08/20/2022   NA 140 08/20/2022   K 4.3 08/20/2022   CL 104 08/20/2022   CO2 29 08/20/2022    Lab Results  Component Value Date   ALT 12 08/20/2022   AST 12 08/20/2022   ALKPHOS 67 05/10/2018   BILITOT 0.3 08/20/2022    Lab  Results  Component Value Date   CHOL 141 05/02/2021   HDL 53 05/02/2021   LDLCALC 75 05/02/2021   TRIG 51 05/02/2021   CHOLHDL 2.7 05/02/2021   HIV 1 RNA Quant (Copies/mL)  Date Value  02/13/2023 Not Detected  08/20/2022 Not Detected  04/17/2022 Not Detected   CD4 T Cell Abs (/uL)  Date Value  08/20/2022 497  06/07/2021 513  11/27/2020 415     Assessment & Plan:   Problem List Items Addressed This Visit       Unprioritized   Routine screening for STI (sexually transmitted infection)    Recent GC/CT exposure- s/p treatment. Screening today and will include RPR. No symptoms.       Relevant Orders   RPR   Urine cytology ancillary only   Cytology (oral, anal, urethral) ancillary only   Cytology (oral, anal, urethral) ancillary only   Asymptomatic HIV infection (HCC) - Primary (Chronic)    Very well controlled on parenteral cabenuva Q18m. No concerns with access or adherence to medication. They are tolerating the medication well without side effects. No drug interactions identified. Pertinent lab tests ordered today.  No changes to insurance coverage.  No dental needs today.  No concern over anxious/depressed mood.  Sexual health and family planning discussed - asymptomatic screening collected today.  Deferred covid booster, prefers flu shot in October.    08/19/2023 with our pharmacy team is his next dose of cabenuva.  TD 16th        Relevant Orders   T-helper cells (CD4) count   HIV 1 RNA quant-no reflex-bld   COMPLETE METABOLIC PANEL WITH GFR   CBC    Rexene Alberts, MSN, NP-C [facility]  [email or web address] Pager: [phone] Office: [phone] [facility] Main Line: [phone]   06/24/23  9:51 AM

## 2023-06-24 NOTE — Assessment & Plan Note (Signed)
Recent GC/CT exposure- s/p treatment. Screening today and will include RPR. No symptoms.

## 2023-06-24 NOTE — Assessment & Plan Note (Signed)
Very well controlled on parenteral cabenuva Q72m. No concerns with access or adherence to medication. They are tolerating the medication well without side effects. No drug interactions identified. Pertinent lab tests ordered today.  No changes to insurance coverage.  No dental needs today.  No concern over anxious/depressed mood.  Sexual health and family planning discussed - asymptomatic screening collected today.  Deferred covid booster, prefers flu shot in October.    08/19/2023 with our pharmacy team is his next dose of cabenuva.  TD 16th

## 2023-06-24 NOTE — Addendum Note (Signed)
Addended by: Marcell Anger on: 06/24/2023 10:00 AM   Modules accepted: Orders

## 2023-06-26 LAB — COMPLETE METABOLIC PANEL WITH GFR
AG Ratio: 1.9 (calc) (ref 1.0–2.5)
ALT: 10 U/L (ref 9–46)
AST: 15 U/L (ref 10–40)
Albumin: 5.2 g/dL — ABNORMAL HIGH (ref 3.6–5.1)
Alkaline phosphatase (APISO): 67 U/L (ref 36–130)
BUN: 13 mg/dL (ref 7–25)
CO2: 26 mmol/L (ref 20–32)
Calcium: 10.1 mg/dL (ref 8.6–10.3)
Chloride: 101 mmol/L (ref 98–110)
Creat: 1.09 mg/dL (ref 0.60–1.24)
Globulin: 2.7 g/dL (calc) (ref 1.9–3.7)
Glucose, Bld: 79 mg/dL (ref 65–99)
Potassium: 4.2 mmol/L (ref 3.5–5.3)
Sodium: 138 mmol/L (ref 135–146)
Total Bilirubin: 0.8 mg/dL (ref 0.2–1.2)
Total Protein: 7.9 g/dL (ref 6.1–8.1)
eGFR: 96 mL/min/{1.73_m2} (ref >=60)

## 2023-06-26 LAB — CBC
HCT: 44.3 % (ref 38.5–50.0)
Hemoglobin: 14.5 g/dL (ref 13.2–17.1)
MCH: 25.7 pg — ABNORMAL LOW (ref 27.0–33.0)
MCHC: 32.7 g/dL (ref 32.0–36.0)
MCV: 78.4 fL — ABNORMAL LOW (ref 80.0–100.0)
MPV: 10.1 fL (ref 7.5–12.5)
Platelets: 266 10*3/uL (ref 140–400)
RBC: 5.65 10*6/uL (ref 4.20–5.80)
RDW: 14.2 % (ref 11.0–15.0)
WBC: 3.9 10*3/uL (ref 3.8–10.8)

## 2023-06-26 LAB — RPR: RPR Ser Ql: NONREACTIVE

## 2023-06-26 LAB — HIV-1 RNA QUANT-NO REFLEX-BLD
HIV 1 RNA Quant: NOT DETECTED Copies/mL
HIV-1 RNA Quant, Log: NOT DETECTED Log cps/mL

## 2023-07-18 ENCOUNTER — Other Ambulatory Visit: Payer: Self-pay

## 2023-07-18 DIAGNOSIS — Z0000 Encounter for general adult medical examination without abnormal findings: Secondary | ICD-10-CM

## 2023-07-18 DIAGNOSIS — Z202 Contact with and (suspected) exposure to infections with a predominantly sexual mode of transmission: Secondary | ICD-10-CM

## 2023-07-18 MED ORDER — DOXYCYCLINE HYCLATE 100 MG PO TABS: 200.0000 mg | ORAL_TABLET | ORAL | 2 refills | Status: DC

## 2023-07-29 NOTE — Progress Notes (Deleted)
Name: Kurt Simpson   MRN: [id number]    DOB: August 26, 1996   Date:07/29/2023       Progress Note  Subjective  Chief Complaint  Annual Exam  HPI  Patient presents for annual CPE.   Diet: *** Exercise: *** Last Dental Exam: **** Last Eye Exam: ***  Depression: phq 9 is {gen pos neg:[id number]}    06/24/2023    9:31 AM 06/03/2023    7:38 AM 05/29/2023    1:05 PM 04/17/2023    9:28 AM 12/17/2022   10:00 AM  Depression screen PHQ 2/9  Decreased Interest 0 0 0 0 0  Down, Depressed, Hopeless 0 0 0 0 0  PHQ - 2 Score 0 0 0 0 0    Hypertension:  BP Readings from Last 3 Encounters:  06/24/23 121/73  06/03/23 118/72  05/29/23 118/74    Obesity: Wt Readings from Last 3 Encounters:  06/24/23 133 lb 3.2 oz (60.4 kg)  06/03/23 141 lb (64 kg)  05/29/23 141 lb 8 oz (64.2 kg)   BMI Readings from Last 3 Encounters:  06/24/23 18.98 kg/m  06/03/23 20.09 kg/m  05/29/23 20.16 kg/m     Lipids:  Lab Results  Component Value Date   CHOL 141 05/02/2021   CHOL 157 07/21/2020   CHOL 159 08/27/2019   Lab Results  Component Value Date   HDL 53 05/02/2021   HDL 35 (L) 07/21/2020   HDL 43 08/27/2019   Lab Results  Component Value Date   LDLCALC 75 05/02/2021   LDLCALC 102 (H) 07/21/2020   LDLCALC 102 (H) 08/27/2019   Lab Results  Component Value Date   TRIG 51 05/02/2021   TRIG 106 07/21/2020   TRIG 46 08/27/2019   Lab Results  Component Value Date   CHOLHDL 2.7 05/02/2021   CHOLHDL 4.5 07/21/2020   CHOLHDL 3.7 08/27/2019   No results found for: "LDLDIRECT" Glucose:  Glucose, Bld  Date Value Ref Range Status  06/24/2023 79 65 - 99 mg/dL Final    Comment:    .            Fasting reference interval .   08/20/2022 79 65 - 99 mg/dL Final    Comment:    .            Fasting reference interval .   05/02/2021 78 65 - 99 mg/dL Final    Comment:    .            Fasting reference interval .     Flowsheet Row Office Visit from 06/03/2023 in [facility]  AUDIT-C Score 0       Single STD testing and prevention (HIV/chl/gon/syphilis): 06/24/23 Sexual history:  Hep C Screening: 07/15/18 Skin cancer: Discussed monitoring for atypical lesions Colorectal cancer: N/A Prostate cancer: N/A No results found for: "PSA"   Lung cancer:  Low Dose CT Chest recommended if Age 65-80 years, 30 pack-year currently smoking OR have quit w/in 15years. Patient  no a candidate for screening   AAA: The USPSTF recommends one-time screening with ultrasonography in men ages 53 to 75 years who have ever smoked. Patient   no, a candidate for screening  ECG:  11/05/07  Vaccines:   HPV: up to date Tdap: up to date Shingrix: N/A Pneumonia: up to date Flu: due COVID-19: up to date  Advanced Care Planning: A voluntary discussion about advance care planning including the explanation and discussion of advance directives.  Discussed health  care proxy and Living will, and the patient was able to identify a health care proxy as ***.  Patient does not have a living will and power of attorney of health care   Patient Active Problem List   Diagnosis Date Noted   Health care maintenance 12/17/2022   Routine screening for STI (sexually transmitted infection) 03/29/2019   Asymptomatic HIV infection (HCC) 07/30/2018   Vitamin D deficiency 11/25/2009   Allergic rhinitis 01/17/2009    Past Surgical History:  Procedure Laterality Date   TONSILLECTOMY AND ADENOIDECTOMY N/A 09/08/2018   Procedure: TONSILLECTOMY AND possible  ADENOIDECTOMY;  Surgeon: Geanie Logan, MD;  Location: [facility];  Service: ENT;  Laterality: N/A;    Family History  Problem Relation Age of Onset   Healthy Mother    Migraines Mother    Healthy Father     Social History   Socioeconomic History   Marital status: Single    Spouse name: Not on file   Number of children: 0   Years of education: Not on file   Highest education level: Not on file   Occupational History   Not on file  Tobacco Use   Smoking status: Never    Passive exposure: Never   Smokeless tobacco: Never  Vaping Use   Vaping status: Never Used  Substance and Sexual Activity   Alcohol use: Not Currently    Comment: occ   Drug use: Not Currently    Types: Marijuana   Sexual activity: Yes    Partners: Male    Birth control/protection: Condom    Comment: declined condoms  Other Topics Concern   Not on file  Social History Narrative   He lives with his mother, step-dad , younger sister.    He was taking classes a [employer], but has taken some time off.    Social Determinants of Health   Financial Resource Strain: Low Risk  (07/30/2022)   Overall Financial Resource Strain (CARDIA)    Difficulty of Paying Living Expenses: Not hard at all  Food Insecurity: No Food Insecurity (07/30/2022)   Hunger Vital Sign    Worried About Running Out of Food in the Last Year: Never true    Ran Out of Food in the Last Year: Never true  Transportation Needs: No Transportation Needs (07/30/2022)   PRAPARE - Administrator, Civil Service (Medical): No    Lack of Transportation (Non-Medical): No  Physical Activity: Sufficiently Active (07/30/2022)   Exercise Vital Sign    Days of Exercise per Week: 5 days    Minutes of Exercise per Session: 80 min  Stress: No Stress Concern Present (07/30/2022)   [employer] of Occupational Health - Occupational Stress Questionnaire    Feeling of Stress : Not at all  Social Connections: Moderately Integrated (07/30/2022)   Social Connection and Isolation Panel [NHANES]    Frequency of Communication with Friends and Family: More than three times a week    Frequency of Social Gatherings with Friends and Family: Once a week    Attends Religious Services: More than 4 times per year    Active Member of [employer] or Organizations: Yes    Attends Banker Meetings: More than 4 times per year    Marital Status: Never married   Intimate Partner Violence: Not At Risk (07/30/2022)   Humiliation, Afraid, Rape, and Kick questionnaire    Fear of Current or Ex-Partner: No    Emotionally Abused: No    Physically Abused:  No    Sexually Abused: No     Current Outpatient Medications:    cabotegravir & rilpivirine ER (CABENUVA) 600 & 900 MG/3ML injection, Inject 1 kit into the muscle every 2 (two) months., Disp: 6 mL, Rfl: 5   doxycycline (VIBRA-TABS) 100 MG tablet, Take 2 tablets (200 mg total) by mouth See admin instructions. Within 24-72 hours of unprotected sex, Disp: 30 tablet, Rfl: 2  No Known Allergies   ROS  ***   Objective  There were no vitals filed for this visit.  There is no height or weight on file to calculate BMI.  Physical Exam ***  Recent Results (from the past 2160 hour(s))  Urine cytology ancillary only     Status: None   Collection Time: 05/29/23  1:26 PM  Result Value Ref Range   Neisseria Gonorrhea Negative    Chlamydia Negative    Trichomonas Negative    Comment Normal Reference Ranger Chlamydia - Negative    Comment      Normal Reference Range Neisseria Gonorrhea - Negative   Comment Normal Reference Range Trichomonas - Negative   Cytology (oral, anal, urethral) ancillary only     Status: Abnormal   Collection Time: 05/29/23  1:30 PM  Result Value Ref Range   Neisseria Gonorrhea Positive (A)    Chlamydia Positive (A)    Comment Normal Reference Ranger Chlamydia - Negative    Comment      Normal Reference Range Neisseria Gonorrhea - Negative  Cytology (oral, anal, urethral) ancillary only     Status: Abnormal   Collection Time: 05/29/23  1:30 PM  Result Value Ref Range   Neisseria Gonorrhea Positive (A)    Chlamydia Negative    Comment Normal Reference Ranger Chlamydia - Negative    Comment      Normal Reference Range Neisseria Gonorrhea - Negative  Cytology (oral, anal, urethral) ancillary only     Status: None   Collection Time: 06/03/23  7:39 AM  Result Value Ref  Range   Neisseria Gonorrhea Negative    Chlamydia Negative    Comment Normal Reference Ranger Chlamydia - Negative    Comment      Normal Reference Range Neisseria Gonorrhea - Negative  Urine cytology ancillary only     Status: None   Collection Time: 06/24/23  9:40 AM  Result Value Ref Range   Neisseria Gonorrhea Negative    Chlamydia Negative    Comment Normal Reference Ranger Chlamydia - Negative    Comment      Normal Reference Range Neisseria Gonorrhea - Negative  Cytology (oral, anal, urethral) ancillary only     Status: None   Collection Time: 06/24/23  9:41 AM  Result Value Ref Range   Neisseria Gonorrhea Negative    Chlamydia Negative    Comment Normal Reference Ranger Chlamydia - Negative    Comment      Normal Reference Range Neisseria Gonorrhea - Negative  Cytology (oral, anal, urethral) ancillary only     Status: None   Collection Time: 06/24/23  9:41 AM  Result Value Ref Range   Neisseria Gonorrhea Negative    Chlamydia Negative    Comment Normal Reference Ranger Chlamydia - Negative    Comment      Normal Reference Range Neisseria Gonorrhea - Negative  T-helper cells (CD4) count     Status: None   Collection Time: 06/24/23  9:41 AM  Result Value Ref Range   CD4 T Cell Abs 544 400 - 1,790 /uL  CD4 % Helper T Cell 36 33 - 65 %    Comment: Performed at [facility], [address] [place], [place] [address]  RPR     Status: None   Collection Time: 06/24/23 10:00 AM  Result Value Ref Range   RPR Ser Ql NON-REACTIVE NON-REACTIVE    Comment: . No laboratory evidence of syphilis. If recent exposure is suspected, submit a new sample in 2-4 weeks. Marland Kitchen   HIV 1 RNA quant-no reflex-bld     Status: None   Collection Time: 06/24/23 10:00 AM  Result Value Ref Range   HIV 1 RNA Quant Not Detected Copies/mL   HIV-1 RNA Quant, Log Not Detected Log cps/mL    Comment: . Reference Range:                           Not Detected     copies/mL                            Not Detected Log copies/mL . Marland Kitchen The test was performed using Real-Time Polymerase Chain Reaction. . . Reportable Range: 20 copies/mL to 10,000,000 copies/mL (1.30 Log copies/mL to 7.00 Log copies/mL). .   COMPLETE METABOLIC PANEL WITH GFR     Status: Abnormal   Collection Time: 06/24/23 10:00 AM  Result Value Ref Range   Glucose, Bld 79 65 - 99 mg/dL    Comment: .            Fasting reference interval .    BUN 13 7 - 25 mg/dL   Creat 0.63 0.16 - 0.10 mg/dL   eGFR 96 > OR = 60 XN/ATF/5.73U2   BUN/Creatinine Ratio SEE NOTE: 6 - 22 (calc)    Comment:    Not Reported: BUN and Creatinine are within    reference range. .    Sodium 138 135 - 146 mmol/L   Potassium 4.2 3.5 - 5.3 mmol/L   Chloride 101 98 - 110 mmol/L   CO2 26 20 - 32 mmol/L   Calcium 10.1 8.6 - 10.3 mg/dL   Total Protein 7.9 6.1 - 8.1 g/dL   Albumin 5.2 (H) 3.6 - 5.1 g/dL   Globulin 2.7 1.9 - 3.7 g/dL (calc)   AG Ratio 1.9 1.0 - 2.5 (calc)   Total Bilirubin 0.8 0.2 - 1.2 mg/dL   Alkaline phosphatase (APISO) 67 36 - 130 U/L   AST 15 10 - 40 U/L   ALT 10 9 - 46 U/L  CBC     Status: Abnormal   Collection Time: 06/24/23 10:00 AM  Result Value Ref Range   WBC 3.9 3.8 - 10.8 Thousand/uL   RBC 5.65 4.20 - 5.80 Million/uL   Hemoglobin 14.5 13.2 - 17.1 g/dL   HCT 02.5 42.7 - 06.2 %   MCV 78.4 (L) 80.0 - 100.0 fL   MCH 25.7 (L) 27.0 - 33.0 pg   MCHC 32.7 32.0 - 36.0 g/dL   RDW 37.6 28.3 - 15.1 %   Platelets 266 140 - 400 Thousand/uL   MPV 10.1 7.5 - 12.5 fL     Fall Risk:    06/24/2023    9:31 AM 06/03/2023    7:38 AM 05/29/2023    1:05 PM 04/17/2023    9:28 AM 12/17/2022   10:00 AM  Fall Risk   Falls in the past year? 0 0 0 0 0  Number falls  in past yr: 0 0 0 0 0  Injury with Fall? 0 0 0 0 0  Risk for fall due to : No Fall Risks      Follow up Falls evaluation completed         Functional Status Survey:      Assessment & Plan  1. Well adult exam ***    -Prostate cancer  screening and PSA options (with potential risks and benefits of testing vs not testing) were discussed along with recent recs/guidelines. -USPSTF grade A and B recommendations reviewed with patient; age-appropriate recommendations, preventive care, screening tests, etc discussed and encouraged; healthy living encouraged; see AVS for patient education given to patient -Discussed importance of 150 minutes of physical activity weekly, eat two servings of fish weekly, eat one serving of tree nuts ( cashews, pistachios, pecans, almonds.Marland Kitchen) every other day, eat 6 servings of fruit/vegetables daily and drink plenty of water and avoid sweet beverages.  -Reviewed Health Maintenance: yes

## 2023-07-30 ENCOUNTER — Encounter: Payer: 59 | Admitting: Family Medicine

## 2023-07-30 DIAGNOSIS — Z23 Encounter for immunization: Secondary | ICD-10-CM

## 2023-07-30 DIAGNOSIS — Z0000 Encounter for general adult medical examination without abnormal findings: Secondary | ICD-10-CM

## 2023-08-17 NOTE — Progress Notes (Unsigned)
HPI: Kurt Simpson is a 27 y.o. male who presents to the [facility] pharmacy clinic for Central administration.  Patient Active Problem List   Diagnosis Date Noted   Health care maintenance 12/17/2022   Routine screening for STI (sexually transmitted infection) 03/29/2019   Asymptomatic HIV infection (HCC) 07/30/2018   Vitamin D deficiency 11/25/2009   Allergic rhinitis 01/17/2009    Patient's Medications  New Prescriptions   No medications on file  Previous Medications   CABOTEGRAVIR & RILPIVIRINE ER (CABENUVA) 600 & 900 MG/3ML INJECTION    Inject 1 kit into the muscle every 2 (two) months.   DOXYCYCLINE (VIBRA-TABS) 100 MG TABLET    Take 2 tablets (200 mg total) by mouth See admin instructions. Within 24-72 hours of unprotected sex  Modified Medications   No medications on file  Discontinued Medications   No medications on file    Allergies: No Known Allergies  Labs: Lab Results  Component Value Date   HIV1RNAQUANT Not Detected 06/24/2023   HIV1RNAQUANT Not Detected 02/13/2023   HIV1RNAQUANT Not Detected 08/20/2022   CD4TABS 544 06/24/2023   CD4TABS 497 08/20/2022   CD4TABS 513 06/07/2021    RPR and STI Lab Results  Component Value Date   LABRPR NON-REACTIVE 06/24/2023   LABRPR NON-REACTIVE 02/13/2023   LABRPR NON-REACTIVE 08/20/2022   LABRPR NON-REACTIVE 04/17/2022   LABRPR NON-REACTIVE 12/18/2021    STI Results GC CT  06/24/2023  9:41 AM Negative    Negative  Negative    Negative   06/24/2023  9:40 AM Negative  Negative   06/03/2023  7:39 AM Negative  Negative   05/29/2023  1:30 PM Positive    Positive  Positive    Negative   05/29/2023  1:26 PM Negative  Negative   02/13/2023  9:57 AM Negative    Negative    Negative  Negative    Negative    Negative   12/17/2022 10:21 AM Negative  Negative   12/17/2022 10:20 AM Negative  Negative   12/17/2022 10:18 AM Positive  Negative   10/22/2022  9:42 AM Negative    Negative    Negative  Negative     Positive    Negative   08/20/2022  9:29 AM Negative    Negative    Negative  Positive    Negative    Negative   06/24/2022  9:43 AM Negative    Positive    Negative  Negative    Negative    Negative   04/17/2022  9:34 AM Negative    Negative    Negative  Negative    Negative    Negative   02/13/2022  9:38 AM Negative    Negative    Negative  Negative    Negative    Negative   10/17/2021 10:22 AM Negative    Negative    Negative  Negative    Negative    Negative   09/18/2021  2:34 PM Negative  Negative   09/18/2021  2:33 PM Negative    Negative  Negative    Negative   06/07/2021  3:12 PM Negative  Negative   06/07/2021  3:11 PM Negative    Negative  Negative    Negative   05/02/2021 12:03 PM Negative    Negative  Negative    Negative     Hepatitis B Lab Results  Component Value Date   HEPBSAB REACTIVE (A) 03/29/2019   HEPBSAG NON-REACTIVE 07/15/2018   HEPBCAB NON-REACTIVE 07/15/2018  Hepatitis C Lab Results  Component Value Date   HEPCAB NON-REACTIVE 07/15/2018   Hepatitis A Lab Results  Component Value Date   HAV REACTIVE (A) 07/15/2018   Lipids: Lab Results  Component Value Date   CHOL 141 05/02/2021   TRIG 51 05/02/2021   HDL 53 05/02/2021   CHOLHDL 2.7 05/02/2021   LDLCALC 75 05/02/2021    TARGET DATE: The 16th of the month  Assessment: Yarel presents today for his maintenance Cabenuva injections. Past injections were tolerated well without issues. He was last seen by Rexene Alberts, NP, on 06/24/23. He remained undetectable, with preserved CD4 at that time. Due for next visit with Judeth Cornfield in March. He reports no concerns for STI. Clifton Custard requests oral/rectal swabs for gonorrhea/chlamydia today.  Immunizations: Due for COVID, influenza, and Menveo today. He politely declines these today.  Administered cabotegravir 600mg /15mL in left upper outer quadrant of the gluteal muscle. Administered rilpivirine 900 mg/10mL in the right upper outer  quadrant of the gluteal muscle. No issues with injections. Pranit will follow up in 2 months for next set of injections.  Plan: - Cabenuva injections administered - STI testing: oral/rectal cytologies for gonorrhea/chlamydia - Next injections scheduled for 10/17/23 with Marchelle Folks, and 12/16/23 with Rexene Alberts, NP - Call with any issues or questions  Lora Paula, PharmD PGY-2 Infectious Diseases Pharmacy Resident [facility] 08/19/2023 11:49 AM

## 2023-08-18 ENCOUNTER — Telehealth: Payer: Self-pay

## 2023-08-18 NOTE — Telephone Encounter (Signed)
error 

## 2023-08-19 ENCOUNTER — Other Ambulatory Visit: Payer: Self-pay

## 2023-08-19 ENCOUNTER — Other Ambulatory Visit (HOSPITAL_COMMUNITY)
Admission: RE | Admit: 2023-08-19 | Discharge: 2023-08-19 | Disposition: A | Discharge status: Home / Self Care | Payer: 59 | Source: Ambulatory Visit | Attending: Infectious Diseases | Admitting: Infectious Diseases

## 2023-08-19 ENCOUNTER — Ambulatory Visit: Payer: 59 | Admitting: Pharmacist

## 2023-08-19 DIAGNOSIS — B20 Human immunodeficiency virus [HIV] disease: Secondary | ICD-10-CM

## 2023-08-19 DIAGNOSIS — Z113 Encounter for screening for infections with a predominantly sexual mode of transmission: Secondary | ICD-10-CM | POA: Diagnosis present

## 2023-08-19 MED ORDER — CABOTEGRAVIR & RILPIVIRINE ER 600 & 900 MG/3ML IM SUER
1.0000 | Freq: Once | INTRAMUSCULAR | Status: AC
  Administered 2023-08-19: 1 via INTRAMUSCULAR

## 2023-08-20 LAB — CYTOLOGY, (ORAL, ANAL, URETHRAL) ANCILLARY ONLY
Chlamydia: NEGATIVE
Chlamydia: NEGATIVE
Comment: NEGATIVE
Comment: NEGATIVE
Comment: NORMAL
Comment: NORMAL
Neisseria Gonorrhea: NEGATIVE
Neisseria Gonorrhea: NEGATIVE

## 2023-09-16 ENCOUNTER — Other Ambulatory Visit: Payer: Self-pay

## 2023-09-16 ENCOUNTER — Ambulatory Visit: Payer: 59 | Admitting: Nurse Practitioner

## 2023-09-16 ENCOUNTER — Encounter: Payer: Self-pay | Admitting: Nurse Practitioner

## 2023-09-16 VITALS — BP 118/72 | HR 81 | Temp 97.9°F | Resp 12 | Ht 69.5 in | Wt 132.7 lb

## 2023-09-16 DIAGNOSIS — R112 Nausea with vomiting, unspecified: Secondary | ICD-10-CM | POA: Diagnosis not present

## 2023-09-16 DIAGNOSIS — R197 Diarrhea, unspecified: Secondary | ICD-10-CM | POA: Diagnosis not present

## 2023-09-16 LAB — POCT INFLUENZA A/B
Influenza A, POC: NEGATIVE
Influenza B, POC: NEGATIVE

## 2023-09-16 MED ORDER — PROMETHAZINE HCL 25 MG PO TABS
25.0000 mg | ORAL_TABLET | Freq: Three times a day (TID) | ORAL | 0 refills | Status: AC | PRN
  Filled 2023-09-16 (×2): qty 20, 7d supply, fill #0

## 2023-09-16 MED ORDER — ONDANSETRON 4 MG PO TBDP
4.0000 mg | ORAL_TABLET | Freq: Three times a day (TID) | ORAL | 0 refills | Status: AC | PRN
  Filled 2023-09-16 (×2): qty 30, 10d supply, fill #0

## 2023-09-16 MED ORDER — LOPERAMIDE HCL 2 MG PO CAPS
2.0000 mg | ORAL_CAPSULE | Freq: Four times a day (QID) | ORAL | 0 refills | Status: AC | PRN
  Filled 2023-09-16: qty 12, 3d supply, fill #0

## 2023-09-16 NOTE — Progress Notes (Signed)
BP 118/72 (BP Location: Right Arm, Patient Position: Sitting, Cuff Size: Normal)   Pulse 81   Temp 97.9 F (36.6 C) (Oral)   Resp 12   Ht 5' 9.5" (1.765 m) Comment: per patient  Wt 132 lb 11.2 oz (60.2 kg)   SpO2 97%   BMI 19.32 kg/m    Subjective:    Patient ID: Kurt Simpson, male    DOB: [birth year], 27 y.o.   MRN: [id number]  HPI: Kurt Simpson is a 27 y.o. male  Chief Complaint  Patient presents with   Cough   Diarrhea   Abdominal Pain   Nausea   Emesis   Discussed the use of AI scribe software for clinical note transcription with the patient, who gave verbal consent to proceed.  History of Present Illness   The patient presents with a 4-day history of severe diarrhea and vomiting. He reports waking up on Saturday feeling lethargic and bloated. Later that day, he experienced extreme fatigue and slept for several hours. Early Sunday morning, he developed severe diarrhea which persisted throughout the day. By Sunday afternoon, he began vomiting and was unable to keep down any food or drink, including ginger ale and saltine crackers.  has been accompanied by generalized abdominal pain.  She has been taking Pepto Bismol, which provided some relief on Sunday, but he began vomiting again early Monday morning. he denies fever and exposure to sick contacts.   Relevant past medical, surgical, family and social history reviewed and updated as indicated. Interim medical history since our last visit reviewed. Allergies and medications reviewed and updated.  Review of Systems  Ten systems reviewed and is negative except as mentioned in HPI      Objective:    BP 118/72 (BP Location: Right Arm, Patient Position: Sitting, Cuff Size: Normal)   Pulse 81   Temp 97.9 F (36.6 C) (Oral)   Resp 12   Ht 5' 9.5" (1.765 m) Comment: per patient  Wt 132 lb 11.2 oz (60.2 kg)   SpO2 97%   BMI 19.32 kg/m   Wt Readings from Last 3 Encounters:  09/16/23 132 lb 11.2 oz (60.2 kg)   06/24/23 133 lb 3.2 oz (60.4 kg)  06/03/23 141 lb (64 kg)    Physical Exam  Constitutional: Patient appears well-developed and well-nourished. No distress.  HEENT: head atraumatic, normocephalic, pupils equal and reactive to light, neck supple Cardiovascular: Normal rate, regular rhythm and normal heart sounds.  No murmur heard. No BLE edema. Pulmonary/Chest: Effort normal and breath sounds normal. No respiratory distress. Abdominal: Soft.  There is no tenderness. Psychiatric: Patient has a normal mood and affect. behavior is normal. Judgment and thought content normal.     Assessment & Plan:   Problem List Items Addressed This Visit   None Visit Diagnoses     Nausea and vomiting, unspecified vomiting type    -  Primary   Relevant Medications   loperamide (IMODIUM) 2 MG capsule   ondansetron (ZOFRAN-ODT) 4 MG disintegrating tablet   promethazine (PHENERGAN) 25 MG tablet   Other Relevant Orders   POCT Influenza A/B (Completed)   Novel Coronavirus, NAA ([facility])   Diarrhea, unspecified type       Relevant Medications   loperamide (IMODIUM) 2 MG capsule   ondansetron (ZOFRAN-ODT) 4 MG disintegrating tablet   promethazine (PHENERGAN) 25 MG tablet   Other Relevant Orders   POCT Influenza A/B (Completed)   Novel Coronavirus, NAA ([facility])  Assessment and Plan    Gastroenteritis Acute onset of fatigue, bloating, diarrhea, and vomiting since Friday night. No fever or known sick contacts. Symptoms improved with bland diet and Pepto Bismol, but now unable to defecate. Abdominal pain localized to the right upper quadrant. -Continue BRAT diet (bananas, rice, applesauce, toast) and hydration. -Prescribe Imodium for diarrhea as needed. -Prescribe Zofran and Phenergan for nausea. -Tested for flu and COVID-19, results pending.  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 -Provide work excuse for today and tomorrow, with potential for extension if needed.          Follow up plan: Return if symptoms worsen or fail to improve.

## 2023-09-17 LAB — SPECIMEN STATUS REPORT

## 2023-09-17 LAB — NOVEL CORONAVIRUS, NAA: SARS-CoV-2, NAA: NOT DETECTED

## 2023-09-18 ENCOUNTER — Encounter: Payer: Self-pay | Admitting: Nurse Practitioner

## 2023-09-22 ENCOUNTER — Other Ambulatory Visit (HOSPITAL_COMMUNITY): Payer: Self-pay

## 2023-10-09 ENCOUNTER — Other Ambulatory Visit: Payer: Self-pay | Admitting: Pharmacist

## 2023-10-09 ENCOUNTER — Other Ambulatory Visit: Payer: Self-pay

## 2023-10-09 ENCOUNTER — Other Ambulatory Visit (HOSPITAL_COMMUNITY): Payer: Self-pay

## 2023-10-09 DIAGNOSIS — B20 Human immunodeficiency virus [HIV] disease: Secondary | ICD-10-CM

## 2023-10-09 NOTE — Progress Notes (Signed)
 In error - has enough refills for current appointment. Will send in more cabenuva  refills as needed.  Alan Geralds, PharmD, CPP, BCIDP, AAHIVP Clinical Pharmacist Practitioner Infectious Diseases Clinical Pharmacist [facility]

## 2023-10-09 NOTE — Progress Notes (Signed)
 Specialty Pharmacy Initial Fill Coordination Note  Kurt Simpson is a 28 y.o. male contacted today regarding initial fill of specialty medication(s) Cabotegravir  & Rilpivirine  (CABENUVA )   Patient requested Courier to Provider Office   Delivery date: 10/10/23   Verified address: [address] [id number]   Medication will be filled on 10/09/23.   Patient is aware of 0.00 copayment.

## 2023-10-13 ENCOUNTER — Telehealth: Payer: Self-pay

## 2023-10-13 NOTE — Telephone Encounter (Signed)
 [facility] Patient Advocate Encounter  Patient's medications CABENUVA  have been couriered to [facility] from [facility] and will be administered at the patients appointment on 10/17/23.  Charmaine Sharps, CPhT Specialty Pharmacy Patient [facility] Phone: [phone] Fax:  [phone]

## 2023-10-17 ENCOUNTER — Other Ambulatory Visit: Payer: Self-pay

## 2023-10-17 ENCOUNTER — Other Ambulatory Visit (HOSPITAL_COMMUNITY): Payer: Self-pay

## 2023-10-17 ENCOUNTER — Ambulatory Visit (INDEPENDENT_AMBULATORY_CARE_PROVIDER_SITE_OTHER): Payer: 59 | Admitting: Pharmacist

## 2023-10-17 DIAGNOSIS — B20 Human immunodeficiency virus [HIV] disease: Secondary | ICD-10-CM

## 2023-10-17 DIAGNOSIS — Z113 Encounter for screening for infections with a predominantly sexual mode of transmission: Secondary | ICD-10-CM

## 2023-10-17 MED ORDER — CABOTEGRAVIR & RILPIVIRINE ER 600 & 900 MG/3ML IM SUER
1.0000 | Freq: Once | INTRAMUSCULAR | Status: AC
  Administered 2023-10-17: 1 via INTRAMUSCULAR

## 2023-10-17 NOTE — Progress Notes (Signed)
 HPI: Kurt Simpson is a 28 y.o. male who presents to the [facility] pharmacy clinic for Cabenuva  administration.  Patient Active Problem List   Diagnosis Date Noted   Health care maintenance 12/17/2022   Routine screening for STI (sexually transmitted infection) 03/29/2019   Asymptomatic HIV infection (HCC) 07/30/2018   Vitamin D  deficiency 11/25/2009   Allergic rhinitis 01/17/2009    Patient's Medications  New Prescriptions   No medications on file  Previous Medications   CABOTEGRAVIR  & RILPIVIRINE  ER (CABENUVA ) 600 & 900 MG/3ML INJECTION    Inject 1 kit into the muscle every 2 (two) months.   LOPERAMIDE  (IMODIUM ) 2 MG CAPSULE    Take 1 capsule (2 mg total) by mouth 4 (four) times daily as needed for diarrhea or loose stools.   ONDANSETRON  (ZOFRAN -ODT) 4 MG DISINTEGRATING TABLET    Take 1 tablet (4 mg total) by mouth every 8 (eight) hours as needed.   PROMETHAZINE  (PHENERGAN ) 25 MG TABLET    Take 1 tablet (25 mg total) by mouth every 8 (eight) hours as needed for nausea or vomiting.  Modified Medications   No medications on file  Discontinued Medications   No medications on file    Allergies: No Known Allergies  Labs: Lab Results  Component Value Date   HIV1RNAQUANT Not Detected 06/24/2023   HIV1RNAQUANT Not Detected 02/13/2023   HIV1RNAQUANT Not Detected 08/20/2022   CD4TABS 544 06/24/2023   CD4TABS 497 08/20/2022   CD4TABS 513 06/07/2021    RPR and STI Lab Results  Component Value Date   LABRPR NON-REACTIVE 06/24/2023   LABRPR NON-REACTIVE 02/13/2023   LABRPR NON-REACTIVE 08/20/2022   LABRPR NON-REACTIVE 04/17/2022   LABRPR NON-REACTIVE 12/18/2021    STI Results GC CT  08/19/2023  9:37 AM Negative    Negative  Negative    Negative   06/24/2023  9:41 AM Negative    Negative  Negative    Negative   06/24/2023  9:40 AM Negative  Negative   06/03/2023  7:39 AM Negative  Negative   05/29/2023  1:30 PM Positive    Positive  Positive    Negative   05/29/2023   1:26 PM Negative  Negative   02/13/2023  9:57 AM Negative    Negative    Negative  Negative    Negative    Negative   12/17/2022 10:21 AM Negative  Negative   12/17/2022 10:20 AM Negative  Negative   12/17/2022 10:18 AM Positive  Negative   10/22/2022  9:42 AM Negative    Negative    Negative  Negative    Positive    Negative   08/20/2022  9:29 AM Negative    Negative    Negative  Positive    Negative    Negative   06/24/2022  9:43 AM Negative    Positive    Negative  Negative    Negative    Negative   04/17/2022  9:34 AM Negative    Negative    Negative  Negative    Negative    Negative   02/13/2022  9:38 AM Negative    Negative    Negative  Negative    Negative    Negative   10/17/2021 10:22 AM Negative    Negative    Negative  Negative    Negative    Negative   09/18/2021  2:34 PM Negative  Negative   09/18/2021  2:33 PM Negative    Negative  Negative    Negative  06/07/2021  3:12 PM Negative  Negative   06/07/2021  3:11 PM Negative    Negative  Negative    Negative     Hepatitis B Lab Results  Component Value Date   HEPBSAB REACTIVE (A) 03/29/2019   HEPBSAG NON-REACTIVE 07/15/2018   HEPBCAB NON-REACTIVE 07/15/2018   Hepatitis C Lab Results  Component Value Date   HEPCAB NON-REACTIVE 07/15/2018   Hepatitis A Lab Results  Component Value Date   HAV REACTIVE (A) 07/15/2018   Lipids: Lab Results  Component Value Date   CHOL 141 05/02/2021   TRIG 51 05/02/2021   HDL 53 05/02/2021   CHOLHDL 2.7 05/02/2021   LDLCALC 75 05/02/2021    TARGET DATE: 16th day of the month  Assessment: Kurt Simpson presents today for his maintenance Cabenuva  injections. Past injections were tolerated well without issues. Last HIV RNA was <20 CD4 544 on 06/24/2023. Does not report any signs and symptoms of STI. Last STI test on 08/19/2023 with oral and rectal cytologies negative for gonorrhea/chlamydia. Patient would like to receive STI testing today with RPR and  oral/rectal/urine cytologies.   Vaccinations: Patient received his influenza vaccine on 09/08/23. Patient declined COVID-19 and Menveo again on today's visit.   Administered cabotegravir  600mg /46mL in left upper outer quadrant of the gluteal muscle. Administered rilpivirine  900 mg/3mL in the right upper outer quadrant of the gluteal muscle. No issues with injections. [facility] pharmacy clinic will follow up in 2 months for next set of injections scheduled with [name], NP, on 12/16/23.   Plan: - Cabenuva  injections administered - STI testing with RPR and oral/rectal/urine cytologies - Next injections scheduled for 12/16/23 with [name], NP.   - HIV viral load on next visit with [name]  - Scheduled to see Alan, CPP, on 02/17/24  - Call with any issues or questions  Cassie L. Kuppelweiser, PharmD, BCIDP, AAHIVP, CPP Clinical Pharmacist Practitioner Infectious Diseases Clinical Pharmacist [facility]

## 2023-10-19 LAB — C. TRACHOMATIS/N. GONORRHOEAE RNA
C. trachomatis RNA, TMA: NOT DETECTED
N. gonorrhoeae RNA, TMA: NOT DETECTED

## 2023-10-19 LAB — CT/NG RNA, TMA RECTAL
Chlamydia Trachomatis RNA: NOT DETECTED
Neisseria Gonorrhoeae RNA: NOT DETECTED

## 2023-10-19 LAB — GC/CHLAMYDIA PROBE, AMP (THROAT)
Chlamydia trachomatis RNA: NOT DETECTED
Neisseria gonorrhoeae RNA: NOT DETECTED

## 2023-10-20 ENCOUNTER — Other Ambulatory Visit (HOSPITAL_COMMUNITY): Payer: Self-pay

## 2023-10-20 LAB — RPR: RPR Ser Ql: NONREACTIVE

## 2023-11-26 ENCOUNTER — Other Ambulatory Visit: Payer: Self-pay

## 2023-11-26 ENCOUNTER — Other Ambulatory Visit: Payer: Self-pay | Admitting: Pharmacist

## 2023-11-26 ENCOUNTER — Other Ambulatory Visit (HOSPITAL_COMMUNITY): Payer: Self-pay

## 2023-11-26 DIAGNOSIS — B20 Human immunodeficiency virus [HIV] disease: Secondary | ICD-10-CM

## 2023-11-26 MED ORDER — CABOTEGRAVIR & RILPIVIRINE ER 600 & 900 MG/3ML IM SUER
1.0000 | INTRAMUSCULAR | 5 refills | Status: AC
  Filled 2023-11-26: qty 6, 60d supply, fill #0
  Filled 2024-02-02: qty 6, 60d supply, fill #1
  Filled 2024-04-08: qty 6, 60d supply, fill #2
  Filled 2024-06-08: qty 6, 60d supply, fill #3
  Filled 2024-08-06: qty 6, 60d supply, fill #4
  Filled 2024-10-20: qty 6, 60d supply, fill #5

## 2023-11-26 NOTE — Progress Notes (Signed)
Specialty Pharmacy Refill Coordination Note  [name] is a 28 y.o. male assessed today regarding refills of clinic administered specialty medication(s) Cabotegravir & Rilpivirine [facility])   Clinic requested Courier to Provider Office   Delivery date: 12/11/23   Verified address: [address] [place] [address]   Medication will be filled on 12/10/23.

## 2023-11-27 ENCOUNTER — Other Ambulatory Visit (HOSPITAL_COMMUNITY): Payer: Self-pay

## 2023-12-10 ENCOUNTER — Telehealth: Payer: Self-pay

## 2023-12-10 ENCOUNTER — Other Ambulatory Visit (HOSPITAL_COMMUNITY): Payer: Self-pay

## 2023-12-10 ENCOUNTER — Other Ambulatory Visit: Payer: Self-pay

## 2023-12-10 NOTE — Telephone Encounter (Signed)
 [facility] Patient Advocate Encounter   Received notification from [employer] that prior authorization for Kurt Simpson is required. (Pharmacy Benefits)   PA submitted on 12/10/23 Key [id number] Status is pending    [facility] Clinic will continue to follow.   [name], CPhT Specialty Pharmacy Patient [facility] for Infectious Disease Phone: [phone] Fax:  [phone]

## 2023-12-10 NOTE — Telephone Encounter (Signed)
 Pharmacy Patient Advocate Encounter- Cabenuva BIV-Pharmacy Benefit:  PA was submitted to [employer] and has been approved through: 12/10/23-12/09/24 Authorization# [id number]  Please send prescription to Specialty Pharmacy: [facility]: [phone]  Estimated Copay is: 0.00

## 2023-12-11 ENCOUNTER — Telehealth: Payer: Self-pay

## 2023-12-11 NOTE — Telephone Encounter (Signed)
 [facility] Patient Advocate Encounter  Patient's medications Kurt Simpson have been couriered to [facility] from [facility] and will be administered at the patients appointment on 12/16/23.  [name], CPhT Specialty Pharmacy Patient [facility] for Infectious Disease Phone: [phone] Fax:  [phone]

## 2023-12-16 ENCOUNTER — Other Ambulatory Visit (HOSPITAL_COMMUNITY)
Admission: RE | Admit: 2023-12-16 | Discharge: 2023-12-16 | Disposition: A | Discharge status: Home / Self Care | Source: Ambulatory Visit | Attending: Infectious Diseases | Admitting: Infectious Diseases

## 2023-12-16 ENCOUNTER — Other Ambulatory Visit: Payer: Self-pay

## 2023-12-16 ENCOUNTER — Encounter: Payer: Self-pay | Admitting: Infectious Diseases

## 2023-12-16 ENCOUNTER — Ambulatory Visit: Payer: 59 | Admitting: Infectious Diseases

## 2023-12-16 VITALS — BP 113/73 | HR 64 | Temp 98.1°F | Wt 142.0 lb

## 2023-12-16 DIAGNOSIS — Z113 Encounter for screening for infections with a predominantly sexual mode of transmission: Secondary | ICD-10-CM

## 2023-12-16 DIAGNOSIS — Z21 Asymptomatic human immunodeficiency virus [HIV] infection status: Secondary | ICD-10-CM

## 2023-12-16 DIAGNOSIS — Z7189 Other specified counseling: Secondary | ICD-10-CM

## 2023-12-16 DIAGNOSIS — Z202 Contact with and (suspected) exposure to infections with a predominantly sexual mode of transmission: Secondary | ICD-10-CM

## 2023-12-16 MED ORDER — CEFTRIAXONE SODIUM 500 MG IJ SOLR
500.0000 mg | Freq: Once | INTRAMUSCULAR | Status: AC
  Administered 2023-12-16: 500 mg via INTRAMUSCULAR

## 2023-12-16 MED ORDER — DOXYCYCLINE HYCLATE 100 MG PO TABS
200.0000 mg | ORAL_TABLET | ORAL | 2 refills | Status: AC
Start: 2023-12-16 — End: ?

## 2023-12-16 MED ORDER — CABOTEGRAVIR & RILPIVIRINE ER 600 & 900 MG/3ML IM SUER
1.0000 | Freq: Once | INTRAMUSCULAR | Status: AC
  Administered 2023-12-16: 1 via INTRAMUSCULAR

## 2023-12-16 MED ORDER — DOXYCYCLINE HYCLATE 100 MG PO TABS
200.0000 mg | ORAL_TABLET | ORAL | 2 refills | Status: DC
  Filled 2023-12-16: qty 20, 10d supply, fill #0

## 2023-12-16 MED ORDER — AZITHROMYCIN 250 MG PO TABS
1000.0000 mg | ORAL_TABLET | Freq: Once | ORAL | Status: AC
  Administered 2023-12-16: 1000 mg via ORAL

## 2023-12-16 NOTE — Assessment & Plan Note (Signed)
 Reports possible STI exposure from sexual partner. Denies symptoms. STI testing completed - swabs x2 and urine. Will treat with ceftriaxone 500mg  and Azithro 1000mg . Refills for Doxy pep sent to preferred pharmacy. Reinforced condom usage.

## 2023-12-16 NOTE — Progress Notes (Deleted)
 Name: Kurt Simpson  DOB: [birth year] MRN: [id number] PCP: Alba Cory, MD    Brief Narrative:  Kurt Simpson is a 28 y.o. male with HIV infection diagnosed in October 2019.  History of OIs: none.  HIV Risk: MSM/bisexual.  CD4 nadir 310/VL 263,000.  [id number] (-), Quantiferon (-). Hep B sAg (-)  Previous Regimens: Dovato --> suppressed  Cabenuva, 09/18/2021  Genotypes: 07/2018 - no mutations    Subjective:   Chief Complaint  Patient presents with   Follow-up     HPI:  Kurt Simpson is here today for follow-up HIV care and Cabenuva administration.  He continues to do very well on these injections and would like to continue them for treatment. No new medications or health conditions since our last office visit together.  No concern over anxious/depressed mood. Sleeping and eating well. Requested STI screening tests today. One episode of unprotected sex. Tx for gonorrhea/chlamydia d/t contact exposure. Uses Doxy pep routinely - causes nausea if taken on empty stomach.   Deferred covid vaccine - uses masks and distancing. Plans for flu shot in October.     Review of Systems  Constitutional:  Negative for chills and fever.  HENT:  Negative for sore throat.   Eyes:  Negative for visual disturbance.  Gastrointestinal:  Negative for abdominal pain, anal bleeding and rectal pain.  Genitourinary:  Negative for dysuria, genital sores, penile discharge, penile pain, scrotal swelling and testicular pain.  Musculoskeletal:  Negative for arthralgias and joint swelling.  Skin:  Negative for rash.  Neurological:  Negative for headaches.  Hematological:  Negative for adenopathy.     Past Medical History:  Diagnosis Date   Allergy    Enlarged tonsils 04/25/2016   HIV infection (HCC)    Mononucleosis    Palpitations    Vitamin D deficiency     Outpatient Medications Prior to Visit  Medication Sig Dispense Refill   cabotegravir & rilpivirine ER (CABENUVA) 600 & 900 MG/3ML  injection Inject 1 kit into the muscle every 2 (two) months. 6 mL 5   loperamide (IMODIUM) 2 MG capsule Take 1 capsule (2 mg total) by mouth 4 (four) times daily as needed for diarrhea or loose stools. 12 capsule 0   ondansetron (ZOFRAN-ODT) 4 MG disintegrating tablet Take 1 tablet (4 mg total) by mouth every 8 (eight) hours as needed. (Patient not taking: Reported on 12/16/2023) 30 tablet 0   promethazine (PHENERGAN) 25 MG tablet Take 1 tablet (25 mg total) by mouth every 8 (eight) hours as needed for nausea or vomiting. (Patient not taking: Reported on 12/16/2023) 20 tablet 0   No facility-administered medications prior to visit.    No Known Allergies  Social History   Tobacco Use   Smoking status: Never    Passive exposure: Never   Smokeless tobacco: Never  Vaping Use   Vaping status: Never Used  Substance Use Topics   Alcohol use: Not Currently    Comment: occ   Drug use: Not Currently    Types: Marijuana    Social History   Substance and Sexual Activity  Sexual Activity Yes   Partners: Male   Birth control/protection: Condom   Comment: declined condoms     Objective:   Vitals:   12/16/23 0853  BP: 113/73  Pulse: 64  Temp: 98.1 F (36.7 C)  TempSrc: Oral  SpO2: 100%  Weight: 142 lb (64.4 kg)   Body mass index is 20.67 kg/m.  Physical Exam Constitutional:  Appearance: Normal appearance. He is not ill-appearing.  HENT:     Head: Normocephalic.     Mouth/Throat:     Mouth: Mucous membranes are moist.     Pharynx: Oropharynx is clear.  Eyes:     General: No scleral icterus. Pulmonary:     Effort: Pulmonary effort is normal.  Musculoskeletal:        General: Normal range of motion.     Cervical back: Normal range of motion.  Skin:    Coloration: Skin is not jaundiced or pale.  Neurological:     Mental Status: He is alert and oriented to person, place, and time.  Psychiatric:        Mood and Affect: Mood normal.        Judgment: Judgment normal.       Lab Results Lab Results  Component Value Date   WBC 3.9 06/24/2023   HGB 14.5 06/24/2023   HCT 44.3 06/24/2023   MCV 78.4 (L) 06/24/2023   PLT 266 06/24/2023    Lab Results  Component Value Date   CREATININE 1.09 06/24/2023   BUN 13 06/24/2023   NA 138 06/24/2023   K 4.2 06/24/2023   CL 101 06/24/2023   CO2 26 06/24/2023    Lab Results  Component Value Date   ALT 10 06/24/2023   AST 15 06/24/2023   ALKPHOS 67 05/10/2018   BILITOT 0.8 06/24/2023    Lab Results  Component Value Date   CHOL 141 05/02/2021   HDL 53 05/02/2021   LDLCALC 75 05/02/2021   TRIG 51 05/02/2021   CHOLHDL 2.7 05/02/2021   HIV 1 RNA Quant (Copies/mL)  Date Value  06/24/2023 Not Detected  02/13/2023 Not Detected  08/20/2022 Not Detected   CD4 T Cell Abs (/uL)  Date Value  06/24/2023 544  08/20/2022 497  06/07/2021 513     Assessment & Plan:   Problem List Items Addressed This Visit   None Visit Diagnoses       STD exposure    -  Primary     Counseling on health promotion and disease prevention           Rexene Alberts, MSN, NP-C [facility]  [email or web address] Pager: [phone] Office: [phone] [facility] Main Line: [phone]   12/16/23  9:07 AM

## 2023-12-16 NOTE — Progress Notes (Signed)
 Name: AMAIR Simpson  DOB: [birth year] MRN: [id number] PCP: Alba Cory, MD    Brief Narrative:  Kurt Simpson is a 28 y.o. male with HIV infection diagnosed in October 2019.  History of OIs: none.  HIV Risk: MSM/bisexual.  CD4 nadir 310/VL 263,000.  [id number] (-), Quantiferon (-). Hep B sAg (-)  Previous Regimens: Dovato --> suppressed  Cabenuva, 09/18/2021  Genotypes: 07/2018 - no mutations    Subjective:   Chief Complaint  Patient presents with   Follow-up     HPI:  Kurt Simpson is here today for follow-up HIV care and Cabenuva administration.  Kurt Simpson's been on Cab about a little over a year with no issues. Denies side effects or adverse reactions. Kurt Simpson is requesting STI testing and treatment today. Reports that his partner recently "stepped out" and Kurt Simpson'd like to be treated prophylacticly. Denies symptoms. Kurt Simpson has been taking Doxy pep and would like refills.       Review of Systems  Constitutional:  Negative for chills and fever.  HENT:  Negative for sore throat.   Eyes:  Negative for visual disturbance.  Gastrointestinal:  Negative for abdominal pain, anal bleeding and rectal pain.  Genitourinary:  Negative for dysuria, genital sores, penile discharge, penile pain, scrotal swelling and testicular pain.  Musculoskeletal:  Negative for arthralgias and joint swelling.  Skin:  Negative for rash.  Neurological:  Negative for headaches.  Hematological:  Negative for adenopathy.  All other systems reviewed and are negative.    Past Medical History:  Diagnosis Date   Allergy    Enlarged tonsils 04/25/2016   HIV infection (HCC)    Mononucleosis    Palpitations    Vitamin D deficiency     Outpatient Medications Prior to Visit  Medication Sig Dispense Refill   cabotegravir & rilpivirine ER (CABENUVA) 600 & 900 MG/3ML injection Inject 1 kit into the muscle every 2 (two) months. 6 mL 5   loperamide (IMODIUM) 2 MG capsule Take 1 capsule (2 mg total) by mouth 4 (four) times  daily as needed for diarrhea or loose stools. 12 capsule 0   ondansetron (ZOFRAN-ODT) 4 MG disintegrating tablet Take 1 tablet (4 mg total) by mouth every 8 (eight) hours as needed. (Patient not taking: Reported on 12/16/2023) 30 tablet 0   promethazine (PHENERGAN) 25 MG tablet Take 1 tablet (25 mg total) by mouth every 8 (eight) hours as needed for nausea or vomiting. (Patient not taking: Reported on 12/16/2023) 20 tablet 0   No facility-administered medications prior to visit.    No Known Allergies  Social History   Tobacco Use   Smoking status: Never    Passive exposure: Never   Smokeless tobacco: Never  Vaping Use   Vaping status: Never Used  Substance Use Topics   Alcohol use: Not Currently    Comment: occ   Drug use: Not Currently    Types: Marijuana    Social History   Substance and Sexual Activity  Sexual Activity Yes   Partners: Male   Birth control/protection: Condom   Comment: declined condoms     Objective:   Vitals:   12/16/23 0853  BP: 113/73  Pulse: 64  Temp: 98.1 F (36.7 C)  TempSrc: Oral  SpO2: 100%  Weight: 142 lb (64.4 kg)   Body mass index is 20.67 kg/m.  Physical Exam Vitals reviewed.  Constitutional:      Appearance: Normal appearance. Kurt Simpson is not ill-appearing.  HENT:     Head: Normocephalic.  Mouth/Throat:     Mouth: Mucous membranes are moist.     Pharynx: Oropharynx is clear.  Eyes:     General: No scleral icterus. Pulmonary:     Effort: Pulmonary effort is normal.  Musculoskeletal:        General: Normal range of motion.     Cervical back: Normal range of motion.  Skin:    Coloration: Skin is not jaundiced or pale.  Neurological:     Mental Status: Kurt Simpson is alert and oriented to person, place, and time.  Psychiatric:        Mood and Affect: Mood normal.        Judgment: Judgment normal.      Lab Results Lab Results  Component Value Date   WBC 3.9 06/24/2023   HGB 14.5 06/24/2023   HCT 44.3 06/24/2023   MCV 78.4  (L) 06/24/2023   PLT 266 06/24/2023    Lab Results  Component Value Date   CREATININE 1.09 06/24/2023   BUN 13 06/24/2023   NA 138 06/24/2023   K 4.2 06/24/2023   CL 101 06/24/2023   CO2 26 06/24/2023    Lab Results  Component Value Date   ALT 10 06/24/2023   AST 15 06/24/2023   ALKPHOS 67 05/10/2018   BILITOT 0.8 06/24/2023    Lab Results  Component Value Date   CHOL 141 05/02/2021   HDL 53 05/02/2021   LDLCALC 75 05/02/2021   TRIG 51 05/02/2021   CHOLHDL 2.7 05/02/2021   HIV 1 RNA Quant (Copies/mL)  Date Value  06/24/2023 Not Detected  02/13/2023 Not Detected  08/20/2022 Not Detected   CD4 T Cell Abs (/uL)  Date Value  06/24/2023 544  08/20/2022 497  06/07/2021 513     Assessment & Plan:   Problem List Items Addressed This Visit       Other   Asymptomatic HIV infection (HCC) (Chronic)   Well controlled on Q2M cab injections. Denies adverse reactions or side effects. Repeat CD4 and VL today. Return to clinic in 2 months for next dose.      Routine screening for STI (sexually transmitted infection)   Reports possible STI exposure from sexual partner. Denies symptoms. STI testing completed - swabs x2 and urine. Will treat with ceftriaxone 500mg  and Azithro 1000mg . Refills for Doxy pep sent to preferred pharmacy. Reinforced condom usage.       Other Visit Diagnoses       STD exposure    -  Primary   Relevant Orders   Cytology (oral, anal, urethral) ancillary only   Cytology (oral, anal, urethral) ancillary only   Urine cytology ancillary only     Counseling on health promotion and disease prevention       Relevant Medications   doxycycline (VIBRA-TABS) 100 MG tablet        12/16/23  9:15 AM

## 2023-12-16 NOTE — Assessment & Plan Note (Signed)
 Well controlled on Q2M cab injections. Denies adverse reactions or side effects. Repeat CD4 and VL today. Return to clinic in 2 months for next dose.

## 2023-12-16 NOTE — Progress Notes (Signed)
 I was present for the evaluation of this patient and agree with the plan outlined below.   Empiric tx for STI exposure with 500 mg Ceftriaxone IM x 1 and Azithromycin 1gm today  FU swab results  VL / CD4 today   02/17/2024 for next appointment with pharmacy - I will continue to see him twice yearly timed with injections.    Rexene Alberts, MSN, NP-C [facility] for Infectious Disease [facility]  [email or web address] Pager: [phone] Office: [phone] [facility] Main Line: [phone] *Secure Chat Communication Welcome

## 2023-12-17 LAB — T-HELPER CELLS (CD4) COUNT (NOT AT ARMC)
CD4 % Helper T Cell: 38 % (ref 33–65)
CD4 T Cell Abs: 356 /uL — ABNORMAL LOW (ref 400–1790)

## 2023-12-17 LAB — CYTOLOGY, (ORAL, ANAL, URETHRAL) ANCILLARY ONLY
Chlamydia: NEGATIVE
Chlamydia: NEGATIVE
Comment: NEGATIVE
Comment: NEGATIVE
Comment: NORMAL
Comment: NORMAL
Neisseria Gonorrhea: NEGATIVE
Neisseria Gonorrhea: NEGATIVE

## 2023-12-17 LAB — URINE CYTOLOGY ANCILLARY ONLY
Chlamydia: NEGATIVE
Comment: NEGATIVE
Comment: NORMAL
Neisseria Gonorrhea: NEGATIVE

## 2023-12-20 LAB — HIV-1 RNA QUANT-NO REFLEX-BLD
HIV 1 RNA Quant: NOT DETECTED {copies}/mL
HIV-1 RNA Quant, Log: NOT DETECTED {Log_copies}/mL

## 2024-02-02 ENCOUNTER — Other Ambulatory Visit: Payer: Self-pay

## 2024-02-02 ENCOUNTER — Other Ambulatory Visit (HOSPITAL_COMMUNITY): Payer: Self-pay

## 2024-02-02 NOTE — Progress Notes (Signed)
 Specialty Pharmacy Refill Coordination Note  Kurt Simpson is a 28 y.o. male assessed today regarding refills of clinic administered specialty medication(s) Cabotegravir  & Rilpivirine  (CABENUVA )   Clinic requested Courier to Provider Office   Delivery date: 02/09/24   Verified address: [address] [place] [address]   Medication will be filled on 02/06/24.

## 2024-02-06 ENCOUNTER — Other Ambulatory Visit: Payer: Self-pay

## 2024-02-09 ENCOUNTER — Telehealth: Payer: Self-pay

## 2024-02-09 NOTE — Telephone Encounter (Signed)
 [facility] Patient Advocate Encounter  Patient's medications Cabenuva  have been couriered to [facility] from [facility] and will be administered at the patients appointment on 02/17/24.  Kurt Simpson, CPhT Specialty Pharmacy Patient [name] for Infectious Disease Phone: [phone] Fax:  [phone]

## 2024-02-16 NOTE — Progress Notes (Unsigned)
 HPI: Kurt Simpson is a 28 y.o. male who presents to the [facility] pharmacy clinic for Cabenuva  administration.  Patient Active Problem List   Diagnosis Date Noted   Health care maintenance 12/17/2022   Routine screening for STI (sexually transmitted infection) 03/29/2019   Asymptomatic HIV infection (HCC) 07/30/2018   Vitamin D  deficiency 11/25/2009   Allergic rhinitis 01/17/2009    Patient's Medications  New Prescriptions   No medications on file  Previous Medications   CABOTEGRAVIR  & RILPIVIRINE  ER (CABENUVA ) 600 & 900 MG/3ML INJECTION    Inject 1 kit into the muscle every 2 (two) months.   DOXYCYCLINE  (VIBRA -TABS) 100 MG TABLET    Take 2 tablets (200 mg total) by mouth See admin instructions. Within 72 hours of unprotected sex.Take with food.   LOPERAMIDE  (IMODIUM ) 2 MG CAPSULE    Take 1 capsule (2 mg total) by mouth 4 (four) times daily as needed for diarrhea or loose stools.   ONDANSETRON  (ZOFRAN -ODT) 4 MG DISINTEGRATING TABLET    Take 1 tablet (4 mg total) by mouth every 8 (eight) hours as needed.   PROMETHAZINE  (PHENERGAN ) 25 MG TABLET    Take 1 tablet (25 mg total) by mouth every 8 (eight) hours as needed for nausea or vomiting.  Modified Medications   No medications on file  Discontinued Medications   No medications on file    Allergies: No Known Allergies  Labs: Lab Results  Component Value Date   HIV1RNAQUANT Not Detected 12/16/2023   HIV1RNAQUANT Not Detected 06/24/2023   HIV1RNAQUANT Not Detected 02/13/2023   CD4TABS 356 (L) 12/16/2023   CD4TABS 544 06/24/2023   CD4TABS 497 08/20/2022    RPR and STI Lab Results  Component Value Date   LABRPR NON-REACTIVE 10/17/2023   LABRPR NON-REACTIVE 06/24/2023   LABRPR NON-REACTIVE 02/13/2023   LABRPR NON-REACTIVE 08/20/2022   LABRPR NON-REACTIVE 04/17/2022    STI Results GC CT  12/16/2023  9:05 AM Negative    Negative    Negative  Negative    Negative    Negative   08/19/2023  9:37 AM Negative    Negative   Negative    Negative   06/24/2023  9:41 AM Negative    Negative  Negative    Negative   06/24/2023  9:40 AM Negative  Negative   06/03/2023  7:39 AM Negative  Negative   05/29/2023  1:30 PM Positive    Positive  Positive    Negative   05/29/2023  1:26 PM Negative  Negative   02/13/2023  9:57 AM Negative    Negative    Negative  Negative    Negative    Negative   12/17/2022 10:21 AM Negative  Negative   12/17/2022 10:20 AM Negative  Negative   12/17/2022 10:18 AM Positive  Negative   10/22/2022  9:42 AM Negative    Negative    Negative  Negative    Positive    Negative   08/20/2022  9:29 AM Negative    Negative    Negative  Positive    Negative    Negative   06/24/2022  9:43 AM Negative    Positive    Negative  Negative    Negative    Negative   04/17/2022  9:34 AM Negative    Negative    Negative  Negative    Negative    Negative   02/13/2022  9:38 AM Negative    Negative    Negative  Negative  Negative    Negative   10/17/2021 10:22 AM Negative    Negative    Negative  Negative    Negative    Negative   09/18/2021  2:34 PM Negative  Negative   09/18/2021  2:33 PM Negative    Negative  Negative    Negative   06/07/2021  3:12 PM Negative  Negative     Hepatitis B Lab Results  Component Value Date   HEPBSAB REACTIVE (A) 03/29/2019   HEPBSAG NON-REACTIVE 07/15/2018   HEPBCAB NON-REACTIVE 07/15/2018   Hepatitis C Lab Results  Component Value Date   HEPCAB NON-REACTIVE 07/15/2018   Hepatitis A Lab Results  Component Value Date   HAV REACTIVE (A) 07/15/2018   Lipids: Lab Results  Component Value Date   CHOL 141 05/02/2021   TRIG 51 05/02/2021   HDL 53 05/02/2021   CHOLHDL 2.7 05/02/2021   LDLCALC 75 05/02/2021    TARGET DATE: 16th of the month  Assessment: Chaden presents today for is maintenance Cabenuva  injections. Past injections were tolerated well without issues. Last HIV RNA was undetectable in March. Doing well with no issues  today.  Requested STI testing today. Will complete routine oral/rectal/urine cytologies today. Would like to defer RPR titer until next appointment with a lab draw. Discussed his doxyPEP regimen today. He says it is going well and he does not need a refill at this time.  Administered cabotegravir  600mg /5mL in left upper outer quadrant of the gluteal muscle. Administered rilpivirine  900 mg/3mL in the right upper outer quadrant of the gluteal muscle. No issues with injections. He will follow up in 2 months for next set of injections.  Plan: - Cabenuva  injections administered - routine oral/rectal/urine cytologies today - Next injections scheduled for 7/16 with Cassie - Call with any issues or questions   Valarie Garner, PharmD PGY1 Pharmacy Resident

## 2024-02-17 ENCOUNTER — Other Ambulatory Visit: Payer: Self-pay

## 2024-02-17 ENCOUNTER — Ambulatory Visit: Payer: 59 | Admitting: Pharmacist

## 2024-02-17 ENCOUNTER — Ambulatory Visit (INDEPENDENT_AMBULATORY_CARE_PROVIDER_SITE_OTHER): Admitting: Pharmacist

## 2024-02-17 DIAGNOSIS — Z113 Encounter for screening for infections with a predominantly sexual mode of transmission: Secondary | ICD-10-CM

## 2024-02-17 DIAGNOSIS — Z21 Asymptomatic human immunodeficiency virus [HIV] infection status: Secondary | ICD-10-CM

## 2024-02-17 DIAGNOSIS — B20 Human immunodeficiency virus [HIV] disease: Secondary | ICD-10-CM

## 2024-02-17 MED ORDER — CABOTEGRAVIR & RILPIVIRINE ER 600 & 900 MG/3ML IM SUER
1.0000 | Freq: Once | INTRAMUSCULAR | Status: AC
  Administered 2024-02-17: 1 via INTRAMUSCULAR

## 2024-02-18 LAB — C. TRACHOMATIS/N. GONORRHOEAE RNA
C. trachomatis RNA, TMA: NOT DETECTED
N. gonorrhoeae RNA, TMA: NOT DETECTED

## 2024-02-18 LAB — GC/CHLAMYDIA PROBE, AMP (THROAT)
Chlamydia trachomatis RNA: NOT DETECTED
Neisseria gonorrhoeae RNA: NOT DETECTED

## 2024-02-18 LAB — CT/NG RNA, TMA RECTAL
Chlamydia Trachomatis RNA: NOT DETECTED
Neisseria Gonorrhoeae RNA: NOT DETECTED

## 2024-04-08 ENCOUNTER — Other Ambulatory Visit: Payer: Self-pay

## 2024-04-08 ENCOUNTER — Other Ambulatory Visit (HOSPITAL_COMMUNITY): Payer: Self-pay

## 2024-04-08 NOTE — Progress Notes (Signed)
 Specialty Pharmacy Refill Coordination Note  Kurt Simpson is a 28 y.o. male assessed today regarding refills of clinic administered specialty medication(s) Cabotegravir  & Rilpivirine  (CABENUVA )   Clinic requested Courier to Provider Office   Delivery date: 04/15/24   Verified address: [address] [place] [address]   Medication will be filled on 04/14/24.

## 2024-04-14 ENCOUNTER — Other Ambulatory Visit: Payer: Self-pay

## 2024-04-15 ENCOUNTER — Telehealth: Payer: Self-pay

## 2024-04-15 NOTE — Telephone Encounter (Signed)
 [facility] Patient Advocate Encounter  Patient's medications CABENUVA  have been couriered to [facility] from [facility] and will be administered at the patients appointment on 04/21/24.  Charmaine Sharps, CPhT Specialty Pharmacy Patient [facility] for Infectious Disease Phone: [phone] Fax:  [phone]

## 2024-04-20 NOTE — Progress Notes (Signed)
 HPI: Kurt Simpson is a 28 y.o. male who presents to the [facility] pharmacy clinic for Cabenuva  administration.  Patient Active Problem List   Diagnosis Date Noted   Health care maintenance 12/17/2022   Routine screening for STI (sexually transmitted infection) 03/29/2019   Asymptomatic HIV infection (HCC) 07/30/2018   Vitamin D  deficiency 11/25/2009   Allergic rhinitis 01/17/2009    Patient's Medications  New Prescriptions   No medications on file  Previous Medications   CABOTEGRAVIR  & RILPIVIRINE  ER (CABENUVA ) 600 & 900 MG/3ML INJECTION    Inject 1 kit into the muscle every 2 (two) months.   DOXYCYCLINE  (VIBRA -TABS) 100 MG TABLET    Take 2 tablets (200 mg total) by mouth See admin instructions. Within 72 hours of unprotected sex.Take with food.   LOPERAMIDE  (IMODIUM ) 2 MG CAPSULE    Take 1 capsule (2 mg total) by mouth 4 (four) times daily as needed for diarrhea or loose stools.   ONDANSETRON  (ZOFRAN -ODT) 4 MG DISINTEGRATING TABLET    Take 1 tablet (4 mg total) by mouth every 8 (eight) hours as needed.   PROMETHAZINE  (PHENERGAN ) 25 MG TABLET    Take 1 tablet (25 mg total) by mouth every 8 (eight) hours as needed for nausea or vomiting.  Modified Medications   No medications on file  Discontinued Medications   No medications on file    Allergies: No Known Allergies  Labs: Lab Results  Component Value Date   HIV1RNAQUANT Not Detected 12/16/2023   HIV1RNAQUANT Not Detected 06/24/2023   HIV1RNAQUANT Not Detected 02/13/2023   CD4TABS 356 (L) 12/16/2023   CD4TABS 544 06/24/2023   CD4TABS 497 08/20/2022    RPR and STI Lab Results  Component Value Date   LABRPR NON-REACTIVE 10/17/2023   LABRPR NON-REACTIVE 06/24/2023   LABRPR NON-REACTIVE 02/13/2023   LABRPR NON-REACTIVE 08/20/2022   LABRPR NON-REACTIVE 04/17/2022    STI Results GC CT  12/16/2023  9:05 AM Negative    Negative    Negative  Negative    Negative    Negative   08/19/2023  9:37 AM Negative    Negative   Negative    Negative   06/24/2023  9:41 AM Negative    Negative  Negative    Negative   06/24/2023  9:40 AM Negative  Negative   06/03/2023  7:39 AM Negative  Negative   05/29/2023  1:30 PM Positive    Positive  Positive    Negative   05/29/2023  1:26 PM Negative  Negative   02/13/2023  9:57 AM Negative    Negative    Negative  Negative    Negative    Negative   12/17/2022 10:21 AM Negative  Negative   12/17/2022 10:20 AM Negative  Negative   12/17/2022 10:18 AM Positive  Negative   10/22/2022  9:42 AM Negative    Negative    Negative  Negative    Positive    Negative   08/20/2022  9:29 AM Negative    Negative    Negative  Positive    Negative    Negative   06/24/2022  9:43 AM Negative    Positive    Negative  Negative    Negative    Negative   04/17/2022  9:34 AM Negative    Negative    Negative  Negative    Negative    Negative   02/13/2022  9:38 AM Negative    Negative    Negative  Negative  Negative    Negative   10/17/2021 10:22 AM Negative    Negative    Negative  Negative    Negative    Negative   09/18/2021  2:34 PM Negative  Negative   09/18/2021  2:33 PM Negative    Negative  Negative    Negative   06/07/2021  3:12 PM Negative  Negative     Hepatitis B Lab Results  Component Value Date   HEPBSAB REACTIVE (A) 03/29/2019   HEPBSAG NON-REACTIVE 07/15/2018   HEPBCAB NON-REACTIVE 07/15/2018   Hepatitis C Lab Results  Component Value Date   HEPCAB NON-REACTIVE 07/15/2018   Hepatitis A Lab Results  Component Value Date   HAV REACTIVE (A) 07/15/2018   Lipids: Lab Results  Component Value Date   CHOL 141 05/02/2021   TRIG 51 05/02/2021   HDL 53 05/02/2021   CHOLHDL 2.7 05/02/2021   LDLCALC 75 05/02/2021    TARGET DATE: The 16th  Assessment: Kurt Simpson presents today for his maintenance Cabenuva  injections. Past injections were tolerated well without issues. Last HIV RNA was not detected in March. Doing well with no issues today.  Due for next visit with [name] in September. Requesting STI testing today.   Administered cabotegravir  600mg /44mL in left upper outer quadrant of the gluteal muscle. Administered rilpivirine  900 mg/3mL in the right upper outer quadrant of the gluteal muscle. No issues with injections. He will follow up in 2 months for next set of injections.  Plan: - Cabenuva  injections administered - HIV RNA, RPR, and urine/rectal/pharyngeal cytologies for GC/chlamydia today - Next injections scheduled for 06/24/24 with [name] and 08/17/24 with [facility] - Call with any issues or questions  Kaelei Wheeler L. Gabrielly Mccrystal, PharmD, BCIDP, AAHIVP, CPP Clinical Pharmacist Practitioner - Infectious Diseases Clinical Pharmacist Lead - Specialty Pharmacy [facility] for Infectious Disease 04/20/2024, 4:25 PM

## 2024-04-21 ENCOUNTER — Other Ambulatory Visit (HOSPITAL_COMMUNITY)
Admission: RE | Admit: 2024-04-21 | Discharge: 2024-04-21 | Disposition: A | Discharge status: Home / Self Care | Source: Ambulatory Visit | Attending: Infectious Diseases | Admitting: Infectious Diseases

## 2024-04-21 ENCOUNTER — Ambulatory Visit: Admitting: Pharmacist

## 2024-04-21 ENCOUNTER — Other Ambulatory Visit: Payer: Self-pay

## 2024-04-21 DIAGNOSIS — Z113 Encounter for screening for infections with a predominantly sexual mode of transmission: Secondary | ICD-10-CM | POA: Insufficient documentation

## 2024-04-21 DIAGNOSIS — Z21 Asymptomatic human immunodeficiency virus [HIV] infection status: Secondary | ICD-10-CM | POA: Diagnosis not present

## 2024-04-21 MED ORDER — CABOTEGRAVIR & RILPIVIRINE ER 600 & 900 MG/3ML IM SUER
1.0000 | Freq: Once | INTRAMUSCULAR | Status: AC
Start: 2024-04-21 — End: 2024-04-21
  Administered 2024-04-21: 1 via INTRAMUSCULAR

## 2024-04-22 LAB — CYTOLOGY, (ORAL, ANAL, URETHRAL) ANCILLARY ONLY
Chlamydia: NEGATIVE
Chlamydia: NEGATIVE
Comment: NEGATIVE
Comment: NEGATIVE
Comment: NORMAL
Comment: NORMAL
Neisseria Gonorrhea: NEGATIVE
Neisseria Gonorrhea: NEGATIVE

## 2024-04-22 LAB — URINE CYTOLOGY ANCILLARY ONLY
Chlamydia: NEGATIVE
Comment: NEGATIVE
Comment: NORMAL
Neisseria Gonorrhea: NEGATIVE

## 2024-04-23 LAB — RPR: RPR Ser Ql: NONREACTIVE

## 2024-04-23 LAB — HIV-1 RNA QUANT-NO REFLEX-BLD
HIV 1 RNA Quant: NOT DETECTED {copies}/mL
HIV-1 RNA Quant, Log: NOT DETECTED {Log_copies}/mL

## 2024-06-08 ENCOUNTER — Other Ambulatory Visit: Payer: Self-pay

## 2024-06-08 ENCOUNTER — Other Ambulatory Visit (HOSPITAL_COMMUNITY): Payer: Self-pay

## 2024-06-08 NOTE — Progress Notes (Signed)
 Specialty Pharmacy Refill Coordination Note  Kurt Simpson is a 28 y.o. male assessed today regarding refills of clinic administered specialty medication(s) Cabotegravir  & Rilpivirine  (CABENUVA )   Clinic requested Courier to Provider Office   Delivery date: 06/21/24   Verified address: [address] [place] [address]   Medication will be filled on 06/18/24.

## 2024-06-18 ENCOUNTER — Other Ambulatory Visit: Payer: Self-pay

## 2024-06-21 ENCOUNTER — Telehealth: Payer: Self-pay

## 2024-06-21 ENCOUNTER — Ambulatory Visit (INDEPENDENT_AMBULATORY_CARE_PROVIDER_SITE_OTHER)

## 2024-06-21 DIAGNOSIS — Z23 Encounter for immunization: Secondary | ICD-10-CM | POA: Diagnosis not present

## 2024-06-21 NOTE — Telephone Encounter (Signed)
 [facility] Patient Advocate Encounter  Patient's medications Cabenuva  have been couriered to [facility] from [facility] and will be administered at the patients appointment on 06/24/24.  Arland Hutchinson, CPhT Specialty Pharmacy Patient [facility] for Infectious Disease Phone: [phone] Fax:  [phone]

## 2024-06-24 ENCOUNTER — Encounter: Payer: Self-pay | Admitting: Infectious Diseases

## 2024-06-24 ENCOUNTER — Other Ambulatory Visit (HOSPITAL_COMMUNITY)
Admission: RE | Admit: 2024-06-24 | Discharge: 2024-06-24 | Disposition: A | Discharge status: Home / Self Care | Source: Ambulatory Visit | Attending: Infectious Diseases | Admitting: Infectious Diseases

## 2024-06-24 ENCOUNTER — Other Ambulatory Visit: Payer: Self-pay

## 2024-06-24 ENCOUNTER — Ambulatory Visit: Admitting: Infectious Diseases

## 2024-06-24 VITALS — BP 108/65 | HR 86 | Temp 100.5°F | Resp 16 | Wt 137.0 lb

## 2024-06-24 DIAGNOSIS — Z113 Encounter for screening for infections with a predominantly sexual mode of transmission: Secondary | ICD-10-CM | POA: Insufficient documentation

## 2024-06-24 DIAGNOSIS — J309 Allergic rhinitis, unspecified: Secondary | ICD-10-CM | POA: Diagnosis not present

## 2024-06-24 DIAGNOSIS — Z21 Asymptomatic human immunodeficiency virus [HIV] infection status: Secondary | ICD-10-CM | POA: Diagnosis not present

## 2024-06-24 MED ORDER — CABOTEGRAVIR & RILPIVIRINE ER 600 & 900 MG/3ML IM SUER
1.0000 | Freq: Once | INTRAMUSCULAR | Status: AC
  Administered 2024-06-24: 1 via INTRAMUSCULAR

## 2024-06-24 NOTE — Progress Notes (Addendum)
 Name: Kurt Simpson  DOB: 05/13/1996 MRN: [id number] PCP: Sowles, Krichna, MD    Brief Narrative:  Kurt Simpson is a 28 y.o. male with HIV infection diagnosed in October 2019.  History of OIs: none.  HIV Risk: MSM/bisexual.  CD4 nadir 310/VL 263,000.  YOJ^A4298 (-), Quantiferon (-). Hep B sAg (-)  Previous Regimens: Dovato  --> suppressed  Cabenuva , 09/18/2021  Genotypes: 07/2018 - no mutations    Subjective:   Chief Complaint  Patient presents with   Follow-up    B20      Discussed the use of AI scribe software for clinical note transcription with the patient, who gave verbal consent to proceed.  History of Present Illness   Kurt Simpson is a 28 year old male who presents for routine follow-up and STD screening.  He is currently participating in the '75 Hard' challenge, which involves working out twice a day, with one workout being outdoors. He is on day eighteen of the challenge but has taken a break this week due to feeling tired after receiving a flu shot on Monday. He plans to resume the challenge next week and aims to gain muscle. He has been doing three miles of cardio daily and is considering reducing it to focus on bulking up. He is using creatine and plans to try pre-workout supplements, having stopped consuming Red Bull due to headaches and heart flutters associated with caffeine intake.  He is tired of chicken and [place] and is avoiding steak due to gastrointestinal discomfort, suspecting he might have IBS. He finds it hard to pass certain foods and is trying to identify triggers. He occasionally eats salmon and is considering a fish oil supplement due to limited fish intake. He avoids pork after a negative experience and is trying to incorporate more home-cooked meals.  He experiences congestion and allergies, which started after his flu shot and a run on Sunday. Allergies have worsened over the past two weeks, possibly due to increased pollen. He has not  been using Flonase or allergy pills recently.  He is not currently taking a multivitamin. He enjoys green leafy vegetables and is open to taking a gummy multivitamin.  No current symptoms related to sexual health but requests regular STD screening. He is on DoxyPep for STI prevention and does not require refills at this time. He is also on Cabenuva for HIV management, with an undetectable viral load.        06/24/2024    3:18 PM 09/16/2023    8:51 AM 06/24/2023    9:31 AM  Depression screen PHQ 2/9  Decreased Interest 0 0 0  Down, Depressed, Hopeless 0 0 0  PHQ - 2 Score 0 0 0      10 /24/2023    3:35 PM  GAD 7 : Generalized Anxiety Score  Nervous, Anxious, on Edge 0  Control/stop worrying 0  Worry too much - different things 0  Trouble relaxing 0  Restless 0  Easily annoyed or irritable 0  Afraid - awful might happen 0  Total GAD 7 Score 0     Review of Systems  Constitutional:  Negative for chills and fever.  HENT:  Negative for sore throat.   Eyes:  Negative for visual disturbance.  Gastrointestinal:  Negative for abdominal pain, anal bleeding and rectal pain.  Genitourinary:  Negative for dysuria, genital sores, penile discharge, penile pain, scrotal swelling and testicular pain.  Musculoskeletal:  Negative for arthralgias and joint swelling.  Skin:  Negative  for rash.  Neurological:  Negative for headaches.  Hematological:  Negative for adenopathy.     Past Medical History:  Diagnosis Date   Allergy     Enlarged tonsils 04/25/2016   HIV infection (HCC)    Mononucleosis    Palpitations    Vitamin D  deficiency     Outpatient Medications Prior to Visit  Medication Sig Dispense Refill   cabotegravir  & rilpivirine  ER (CABENUVA ) 600 & 900 MG/3ML injection Inject 1 kit into the muscle every 2 (two) months. 6 mL 5   doxycycline  (VIBRA -TABS) 100 MG tablet Take 2 tablets (200 mg total) by mouth See admin instructions. Within 72 hours of unprotected sex.Take with food. 20  tablet 2   loperamide  (IMODIUM ) 2 MG capsule Take 1 capsule (2 mg total) by mouth 4 (four) times daily as needed for diarrhea or loose stools. (Patient not taking: Reported on 06/24/2024) 12 capsule 0   ondansetron  (ZOFRAN -ODT) 4 MG disintegrating tablet Take 1 tablet (4 mg total) by mouth every 8 (eight) hours as needed. (Patient not taking: Reported on 06/24/2024) 30 tablet 0   promethazine  (PHENERGAN ) 25 MG tablet Take 1 tablet (25 mg total) by mouth every 8 (eight) hours as needed for nausea or vomiting. (Patient not taking: Reported on 06/24/2024) 20 tablet 0   No facility-administered medications prior to visit.    No Known Allergies  Social History   Tobacco Use   Smoking status: Never    Passive exposure: Never   Smokeless tobacco: Never  Vaping Use   Vaping status: Never Used  Substance Use Topics   Alcohol use: Not Currently    Comment: occ   Drug use: Not Currently    Types: Marijuana    Social History   Substance and Sexual Activity  Sexual Activity Yes   Partners: Male   Birth control/protection: Condom   Comment: declined condoms     Objective:   Vitals:   06/24/24 1500  BP: 108/65  Pulse: 86  Resp: 16  Temp: (!) 100.5 F (38.1 C)  TempSrc: Oral  SpO2: 94%  Weight: 137 lb (62.1 kg)   Body mass index is 19.94 kg/m.  Physical Exam Constitutional:      Appearance: Normal appearance. He is not ill-appearing.  HENT:     Head: Normocephalic.     Mouth/Throat:     Mouth: Mucous membranes are moist.     Pharynx: Oropharynx is clear.  Eyes:     General: No scleral icterus. Pulmonary:     Effort: Pulmonary effort is normal.  Musculoskeletal:        General: Normal range of motion.     Cervical back: Normal range of motion.  Skin:    Coloration: Skin is not jaundiced or pale.  Neurological:     Mental Status: He is alert and oriented to person, place, and time.  Psychiatric:        Mood and Affect: Mood normal.        Judgment: Judgment normal.       Lab Results Lab Results  Component Value Date   WBC 3.9 06/24/2023   HGB 14.5 06/24/2023   HCT 44.3 06/24/2023   MCV 78.4 (L) 06/24/2023   PLT 266 06/24/2023    Lab Results  Component Value Date   CREATININE 1.09 06/24/2023   BUN 13 06/24/2023   NA 138 06/24/2023   K 4.2 06/24/2023   CL 101 06/24/2023   CO2 26 06/24/2023    Lab Results  Component  Value Date   ALT 10 06/24/2023   AST 15 06/24/2023   ALKPHOS 67 05/10/2018   BILITOT 0.8 06/24/2023    Lab Results  Component Value Date   CHOL 141 05/02/2021   HDL 53 05/02/2021   LDLCALC 75 05/02/2021   TRIG 51 05/02/2021   CHOLHDL 2.7 05/02/2021   HIV 1 RNA Quant  Date Value  04/21/2024 NOT DETECTED copies/mL  12/16/2023 Not Detected Copies/mL  06/24/2023 Not Detected Copies/mL   CD4 T Cell Abs (/uL)  Date Value  12/16/2023 356 (L)  06/24/2023 544  08/20/2022 497     Assessment & Plan:     Human immunodeficiency virus (HIV) infection - HIV infection with undetectable viral load, well-managed. Last blood work in July showed undetectable viral load. - Administer Cabenuva  injection today - Order blood work including CD4 count, CMP, and complete blood count - Perform syphilis screening - flu shot previously given   Allergic rhinitis - Congestion and allergy  symptoms, possibly exacerbated by recent influenza vaccination and seasonal changes. Symptoms present for two weeks. - Recommend purchasing Claritin for allergy  relief - Suggest using Flonase  and allergy  pills as needed  Iron deficiency (suspected) - Suspected iron deficiency due to borderline RDW and low MCH.  - Consider starting a multivitamin with iron - Encourage consumption of iron-rich foods and possibly iron supplements   Sexual health -  Asymptomatic testing collected today.      Orders Placed This Encounter  Procedures   HIV 1 RNA quant-no reflex-bld   T-helper cells (CD4) count   RPR   COMPLETE METABOLIC PANEL WITHOUT GFR   CBC  w/Diff   No orders of the defined types were placed in this encounter.  08/17/2024 is your next appointment for Cabenuva .    [name] Fireman, MSN, NP-C [facility]  [email or web address] Pager: [phone] Office: [phone] [facility] Main Line: [phone]   06/24/24  3:08 PM

## 2024-06-24 NOTE — Patient Instructions (Addendum)
 Would consider adding a multivitamin supplement once  a day to cover some holes in nutrition you may have with food intolerances.   Iron in particular   Will see you back again on 08/17/2024 for your next injection appointment.

## 2024-06-25 ENCOUNTER — Ambulatory Visit: Payer: Self-pay | Admitting: Infectious Diseases

## 2024-06-25 LAB — T-HELPER CELLS (CD4) COUNT (NOT AT ARMC)
CD4 % Helper T Cell: 40 % (ref 33–65)
CD4 T Cell Abs: 563 /uL (ref 400–1790)

## 2024-06-25 LAB — CYTOLOGY, (ORAL, ANAL, URETHRAL) ANCILLARY ONLY
Chlamydia: NEGATIVE
Chlamydia: NEGATIVE
Comment: NEGATIVE
Comment: NEGATIVE
Comment: NORMAL
Comment: NORMAL
Neisseria Gonorrhea: NEGATIVE
Neisseria Gonorrhea: NEGATIVE

## 2024-06-25 LAB — URINE CYTOLOGY ANCILLARY ONLY
Chlamydia: NEGATIVE
Comment: NEGATIVE
Comment: NORMAL
Neisseria Gonorrhea: NEGATIVE

## 2024-06-28 MED ORDER — DOXYCYCLINE HYCLATE 100 MG PO TABS: 100.0000 mg | ORAL_TABLET | Freq: Two times a day (BID) | ORAL | 0 refills | Status: AC

## 2024-06-29 LAB — CBC WITH DIFFERENTIAL/PLATELET
Absolute Lymphocytes: 1444 {cells}/uL (ref 850–3900)
Absolute Monocytes: 720 {cells}/uL (ref 200–950)
Basophils Absolute: 32 {cells}/uL (ref 0–200)
Basophils Relative: 0.8 %
Eosinophils Absolute: 140 {cells}/uL (ref 15–500)
Eosinophils Relative: 3.5 %
HCT: 39.5 % (ref 38.5–50.0)
Hemoglobin: 12.9 g/dL — ABNORMAL LOW (ref 13.2–17.1)
MCH: 26.2 pg — ABNORMAL LOW (ref 27.0–33.0)
MCHC: 32.7 g/dL (ref 32.0–36.0)
MCV: 80.3 fL (ref 80.0–100.0)
MPV: 9.8 fL (ref 7.5–12.5)
Monocytes Relative: 18 %
Neutro Abs: 1664 {cells}/uL (ref 1500–7800)
Neutrophils Relative %: 41.6 %
Platelets: 412 Thousand/uL — ABNORMAL HIGH (ref 140–400)
RBC: 4.92 Million/uL (ref 4.20–5.80)
RDW: 13.1 % (ref 11.0–15.0)
Total Lymphocyte: 36.1 %
WBC: 4 Thousand/uL (ref 3.8–10.8)

## 2024-06-29 LAB — COMPLETE METABOLIC PANEL WITHOUT GFR
AG Ratio: 1.8 (calc) (ref 1.0–2.5)
ALT: 27 U/L (ref 9–46)
AST: 21 U/L (ref 10–40)
Albumin: 4.4 g/dL (ref 3.6–5.1)
Alkaline phosphatase (APISO): 75 U/L (ref 36–130)
BUN: 12 mg/dL (ref 7–25)
CO2: 26 mmol/L (ref 20–32)
Calcium: 9.1 mg/dL (ref 8.6–10.3)
Chloride: 105 mmol/L (ref 98–110)
Creat: 0.87 mg/dL (ref 0.60–1.24)
Globulin: 2.5 g/dL (ref 1.9–3.7)
Glucose, Bld: 98 mg/dL (ref 65–99)
Potassium: 4.1 mmol/L (ref 3.5–5.3)
Sodium: 140 mmol/L (ref 135–146)
Total Bilirubin: 0.2 mg/dL (ref 0.2–1.2)
Total Protein: 6.9 g/dL (ref 6.1–8.1)

## 2024-06-29 LAB — RPR: RPR Ser Ql: REACTIVE — AB

## 2024-06-29 LAB — HIV-1 RNA QUANT-NO REFLEX-BLD
HIV 1 RNA Quant: NOT DETECTED {copies}/mL
HIV-1 RNA Quant, Log: NOT DETECTED {Log_copies}/mL

## 2024-06-29 LAB — T PALLIDUM AB: T Pallidum Abs: POSITIVE — AB

## 2024-06-29 LAB — RPR TITER: RPR Titer: 1:16 {titer} — ABNORMAL HIGH

## 2024-08-06 ENCOUNTER — Other Ambulatory Visit: Payer: Self-pay

## 2024-08-06 ENCOUNTER — Other Ambulatory Visit (HOSPITAL_COMMUNITY): Payer: Self-pay

## 2024-08-06 NOTE — Progress Notes (Signed)
 Specialty Pharmacy Refill Coordination Note  Kurt Simpson is a 28 y.o. male assessed today regarding refills of clinic administered specialty medication(s) Cabotegravir  & Rilpivirine  (CABENUVA )   Clinic requested Courier to Provider Office   Delivery date: 08/16/24   Verified address: [address] [name] [address]   Medication will be filled on 08/13/24.

## 2024-08-13 ENCOUNTER — Other Ambulatory Visit (HOSPITAL_COMMUNITY): Payer: Self-pay

## 2024-08-13 ENCOUNTER — Other Ambulatory Visit: Payer: Self-pay

## 2024-08-16 ENCOUNTER — Telehealth: Payer: Self-pay

## 2024-08-16 NOTE — Telephone Encounter (Signed)
 [facility] Patient Advocate Encounter  Patient's medications Cabenuva  have been couriered to [facility] from [facility] and will be administered at the patients appointment on 08/17/24.  Arland Hutchinson, CPhT Specialty Pharmacy Patient [facility] for Infectious Disease Phone: [phone] Fax:  [phone]

## 2024-08-17 ENCOUNTER — Ambulatory Visit: Payer: Self-pay

## 2024-08-17 ENCOUNTER — Ambulatory Visit: Payer: Self-pay | Admitting: Pharmacist

## 2024-08-17 ENCOUNTER — Other Ambulatory Visit: Payer: Self-pay

## 2024-08-17 ENCOUNTER — Other Ambulatory Visit (HOSPITAL_COMMUNITY): Payer: Self-pay

## 2024-08-17 DIAGNOSIS — Z21 Asymptomatic human immunodeficiency virus [HIV] infection status: Secondary | ICD-10-CM

## 2024-08-17 DIAGNOSIS — D649 Anemia, unspecified: Secondary | ICD-10-CM

## 2024-08-17 DIAGNOSIS — Z113 Encounter for screening for infections with a predominantly sexual mode of transmission: Secondary | ICD-10-CM

## 2024-08-17 MED ORDER — CABOTEGRAVIR & RILPIVIRINE ER 600 & 900 MG/3ML IM SUER
1.0000 | Freq: Once | INTRAMUSCULAR | Status: AC
  Administered 2024-08-17: 1 via INTRAMUSCULAR

## 2024-08-17 NOTE — Progress Notes (Signed)
 HPI: DETAVIOUS Simpson is a 28 y.o. male who presents to the [facility] pharmacy clinic for Cabenuva  administration.  Referring ID Provider: [name] Fireman, NP  Patient Active Problem List   Diagnosis Date Noted   Health care maintenance 12/17/2022   Routine screening for STI (sexually transmitted infection) 03/29/2019   Asymptomatic HIV infection (HCC) 07/30/2018   Vitamin D  deficiency 11/25/2009   Allergic rhinitis 01/17/2009    Patient's Medications  New Prescriptions   No medications on file  Previous Medications   CABOTEGRAVIR  & RILPIVIRINE  ER (CABENUVA ) 600 & 900 MG/3ML INJECTION    Inject 1 kit into the muscle every 2 (two) months.   DOXYCYCLINE  (VIBRA -TABS) 100 MG TABLET    Take 2 tablets (200 mg total) by mouth See admin instructions. Within 72 hours of unprotected sex.Take with food.   LOPERAMIDE  (IMODIUM ) 2 MG CAPSULE    Take 1 capsule (2 mg total) by mouth 4 (four) times daily as needed for diarrhea or loose stools.   ONDANSETRON  (ZOFRAN -ODT) 4 MG DISINTEGRATING TABLET    Take 1 tablet (4 mg total) by mouth every 8 (eight) hours as needed.   PROMETHAZINE  (PHENERGAN ) 25 MG TABLET    Take 1 tablet (25 mg total) by mouth every 8 (eight) hours as needed for nausea or vomiting.  Modified Medications   No medications on file  Discontinued Medications   No medications on file    Allergies: No Known Allergies  Labs: Lab Results  Component Value Date   HIV1RNAQUANT NOT DETECTED 06/24/2024   HIV1RNAQUANT NOT DETECTED 04/21/2024   HIV1RNAQUANT Not Detected 12/16/2023   CD4TABS 563 06/24/2024   CD4TABS 356 (L) 12/16/2023   CD4TABS 544 06/24/2023    RPR and STI Lab Results  Component Value Date   LABRPR REACTIVE (A) 06/24/2024   LABRPR NON-REACTIVE 04/21/2024   LABRPR NON-REACTIVE 10/17/2023   LABRPR NON-REACTIVE 06/24/2023   LABRPR NON-REACTIVE 02/13/2023   RPRTITER 1:16 (H) 06/24/2024    STI Results GC CT  06/24/2024  3:12 PM Negative    Negative    Negative   Negative    Negative    Negative   04/21/2024  3:12 PM Negative    Negative    Negative  Negative    Negative    Negative   12/16/2023  9:05 AM Negative    Negative    Negative  Negative    Negative    Negative   08/19/2023  9:37 AM Negative    Negative  Negative    Negative   06/24/2023  9:41 AM Negative    Negative  Negative    Negative   06/24/2023  9:40 AM Negative  Negative   06/03/2023  7:39 AM Negative  Negative   05/29/2023  1:30 PM Positive    Positive  Positive    Negative   05/29/2023  1:26 PM Negative  Negative   02/13/2023  9:57 AM Negative    Negative    Negative  Negative    Negative    Negative   12/17/2022 10:21 AM Negative  Negative   12/17/2022 10:20 AM Negative  Negative   12/17/2022 10:18 AM Positive  Negative   10/22/2022  9:42 AM Negative    Negative    Negative  Negative    Positive    Negative   08/20/2022  9:29 AM Negative    Negative    Negative  Positive    Negative    Negative   06/24/2022  9:43 AM  Negative    Positive    Negative  Negative    Negative    Negative   04/17/2022  9:34 AM Negative    Negative    Negative  Negative    Negative    Negative   02/13/2022  9:38 AM Negative    Negative    Negative  Negative    Negative    Negative   10/17/2021 10:22 AM Negative    Negative    Negative  Negative    Negative    Negative   09/18/2021  2:34 PM Negative  Negative     Hepatitis B Lab Results  Component Value Date   HEPBSAB REACTIVE (A) 03/29/2019   HEPBSAG NON-REACTIVE 07/15/2018   HEPBCAB NON-REACTIVE 07/15/2018   Hepatitis C Lab Results  Component Value Date   HEPCAB NON-REACTIVE 07/15/2018   Hepatitis A Lab Results  Component Value Date   HAV REACTIVE (A) 07/15/2018   Lipids: Lab Results  Component Value Date   CHOL 141 05/02/2021   TRIG 51 05/02/2021   HDL 53 05/02/2021   CHOLHDL 2.7 05/02/2021   LDLCALC 75 05/02/2021    Target Date: 16th  Assessment: Kurt Simpson presents today for his  maintenance Cabenuva  injections. Past injections were tolerated well without issues. Last HIV RNA was undetectable on 06/24/2024. Doing well with no issues today.   Upon chart review, patient tested positive for syphilis and was prescribed doxycycline  x2 weeks during last office visit in September. Noted that [name] planned to repeat RPR titer. Additionally, noted from last office visit that there are some concerns for suspected iron deficiency anemia with plans to repeat CBC with differential and order a full iron panel during next labs, therefore, will order these today.   Lab work:  - Oral/urine/rectal cytologies for GC/Chlamydia, RPR, CBC with differential, iron panel, lipid panel (last checked in 04/2021)  Eligible vaccinations: Covid, PCV20, Menveo booster, Shingrix 1/2 - Patient politely declines vaccines today   Cabenuva : Administered cabotegravir  600mg /72mL in left upper outer quadrant of the gluteal muscle. Administered rilpivirine  900 mg/3mL in the right upper outer quadrant of the gluteal muscle. No issues with injections. Kurt Simpson will follow up in 2 months for next set of injections.  Plan: - Cabenuva  injections administered - Check lipid panel, CBC, iron panel, RPR, and urine/oral/rectal cytologies  - Next injections scheduled for 10/18/2024 with [name], then on 12/14/2024 with Alan - Call with any issues or questions  Kurt Simpson Close, PharmD PGY2 Infectious Diseases Pharmacy Resident

## 2024-08-19 LAB — CBC WITH DIFFERENTIAL/PLATELET
Absolute Lymphocytes: 1602 {cells}/uL (ref 850–3900)
Absolute Monocytes: 383 {cells}/uL (ref 200–950)
Basophils Absolute: 59 {cells}/uL (ref 0–200)
Basophils Relative: 1.3 %
Eosinophils Absolute: 131 {cells}/uL (ref 15–500)
Eosinophils Relative: 2.9 %
HCT: 41.3 % (ref 38.5–50.0)
Hemoglobin: 13.4 g/dL (ref 13.2–17.1)
MCH: 26 pg — ABNORMAL LOW (ref 27.0–33.0)
MCHC: 32.4 g/dL (ref 32.0–36.0)
MCV: 80 fL (ref 80.0–100.0)
MPV: 9.8 fL (ref 7.5–12.5)
Monocytes Relative: 8.5 %
Neutro Abs: 2327 {cells}/uL (ref 1500–7800)
Neutrophils Relative %: 51.7 %
Platelets: 275 Thousand/uL (ref 140–400)
RBC: 5.16 Million/uL (ref 4.20–5.80)
RDW: 14.5 % (ref 11.0–15.0)
Total Lymphocyte: 35.6 %
WBC: 4.5 Thousand/uL (ref 3.8–10.8)

## 2024-08-19 LAB — IRON,TIBC AND FERRITIN PANEL
%SAT: 22 % (ref 20–48)
Ferritin: 31 ng/mL — ABNORMAL LOW (ref 38–380)
Iron: 74 ug/dL (ref 50–195)
TIBC: 332 ug/dL (ref 250–425)

## 2024-08-19 LAB — CYTOLOGY, (ORAL, ANAL, URETHRAL) ANCILLARY ONLY
Chlamydia: NEGATIVE
Chlamydia: NEGATIVE
Comment: NEGATIVE
Comment: NEGATIVE
Comment: NORMAL
Comment: NORMAL
Neisseria Gonorrhea: NEGATIVE
Neisseria Gonorrhea: NEGATIVE

## 2024-08-19 LAB — LIPID PANEL
Cholesterol: 155 mg/dL (ref <200)
HDL: 60 mg/dL (ref >=40)
LDL Cholesterol (Calc): 81 mg/dL
Non-HDL Cholesterol (Calc): 95 mg/dL (ref <130)
Total CHOL/HDL Ratio: 2.6 (calc) (ref <5.0)
Triglycerides: 49 mg/dL (ref <150)

## 2024-08-19 LAB — RPR: RPR Ser Ql: REACTIVE — AB

## 2024-08-19 LAB — T PALLIDUM AB: T Pallidum Abs: POSITIVE — AB

## 2024-08-19 LAB — RPR TITER: RPR Titer: 1:4 {titer} — ABNORMAL HIGH

## 2024-08-19 LAB — URINE CYTOLOGY ANCILLARY ONLY
Chlamydia: NEGATIVE
Comment: NEGATIVE
Comment: NORMAL
Neisseria Gonorrhea: NEGATIVE

## 2024-08-20 ENCOUNTER — Ambulatory Visit: Payer: Self-pay | Admitting: Pharmacist

## 2024-08-20 NOTE — Progress Notes (Signed)
 FYI on his labs from our visit!

## 2024-09-15 ENCOUNTER — Other Ambulatory Visit (HOSPITAL_COMMUNITY): Payer: Self-pay

## 2024-10-18 ENCOUNTER — Ambulatory Visit: Payer: Self-pay | Admitting: Infectious Diseases

## 2024-10-20 ENCOUNTER — Other Ambulatory Visit (HOSPITAL_COMMUNITY): Payer: Self-pay

## 2024-10-20 ENCOUNTER — Other Ambulatory Visit: Payer: Self-pay

## 2024-10-20 NOTE — Progress Notes (Signed)
 Specialty Pharmacy Refill Coordination Note  Kurt Simpson is a 29 y.o. male assessed today regarding refills of clinic administered specialty medication(s) Cabotegravir  & Rilpivirine  (CABENUVA )   Clinic requested Courier to Provider Office   Delivery date: 10/25/24   Verified address: [address] [place] [address]   Medication will be filled on 10/22/24.

## 2024-10-22 ENCOUNTER — Other Ambulatory Visit: Payer: Self-pay

## 2024-10-25 ENCOUNTER — Telehealth: Payer: Self-pay

## 2024-10-25 NOTE — Telephone Encounter (Signed)
 [facility] Patient Advocate Encounter  Patient's medications Cabenuva  have been couriered to [facility] from [facility] and will be administered at the patients appointment on 10/26/24.  Arland Hutchinson, CPhT Specialty Pharmacy Patient [facility] for Infectious Disease Phone: [phone] Fax:  [phone]

## 2024-10-25 NOTE — Progress Notes (Unsigned)
 "  HPI: Kurt Simpson is a 29 y.o. male who presents to the [facility] pharmacy clinic for Cabenuva  administration.  Referring ID Provider: [name] Fireman, NP  Patient Active Problem List   Diagnosis Date Noted   Health care maintenance 12/17/2022   Routine screening for STI (sexually transmitted infection) 03/29/2019   Asymptomatic HIV infection (HCC) 07/30/2018   Vitamin D  deficiency 11/25/2009   Allergic rhinitis 01/17/2009    Patient's Medications  New Prescriptions   No medications on file  Previous Medications   CABOTEGRAVIR  & RILPIVIRINE  ER (CABENUVA ) 600 & 900 MG/3ML INJECTION    Inject 1 kit into the muscle every 2 (two) months.   DOXYCYCLINE  (VIBRA -TABS) 100 MG TABLET    Take 2 tablets (200 mg total) by mouth See admin instructions. Within 72 hours of unprotected sex.Take with food.   LOPERAMIDE  (IMODIUM ) 2 MG CAPSULE    Take 1 capsule (2 mg total) by mouth 4 (four) times daily as needed for diarrhea or loose stools.   ONDANSETRON  (ZOFRAN -ODT) 4 MG DISINTEGRATING TABLET    Take 1 tablet (4 mg total) by mouth every 8 (eight) hours as needed.   PROMETHAZINE  (PHENERGAN ) 25 MG TABLET    Take 1 tablet (25 mg total) by mouth every 8 (eight) hours as needed for nausea or vomiting.  Modified Medications   No medications on file  Discontinued Medications   No medications on file    Allergies: Allergies[1]  Labs: Lab Results  Component Value Date   HIV1RNAQUANT NOT DETECTED 06/24/2024   HIV1RNAQUANT NOT DETECTED 04/21/2024   HIV1RNAQUANT Not Detected 12/16/2023   CD4TABS 563 06/24/2024   CD4TABS 356 (L) 12/16/2023   CD4TABS 544 06/24/2023    RPR and STI Lab Results  Component Value Date   LABRPR REACTIVE (A) 08/17/2024   LABRPR REACTIVE (A) 06/24/2024   LABRPR NON-REACTIVE 04/21/2024   LABRPR NON-REACTIVE 10/17/2023   LABRPR NON-REACTIVE 06/24/2023   RPRTITER 1:4 (H) 08/17/2024   RPRTITER 1:16 (H) 06/24/2024    STI Results GC CT  08/17/2024  3:40 PM Negative     Negative    Negative  Negative    Negative    Negative   06/24/2024  3:12 PM Negative    Negative    Negative  Negative    Negative    Negative   04/21/2024  3:12 PM Negative    Negative    Negative  Negative    Negative    Negative   12/16/2023  9:05 AM Negative    Negative    Negative  Negative    Negative    Negative   08/19/2023  9:37 AM Negative    Negative  Negative    Negative   06/24/2023  9:41 AM Negative    Negative  Negative    Negative   06/24/2023  9:40 AM Negative  Negative   06/03/2023  7:39 AM Negative  Negative   05/29/2023  1:30 PM Positive    Positive  Positive    Negative   05/29/2023  1:26 PM Negative  Negative   02/13/2023  9:57 AM Negative    Negative    Negative  Negative    Negative    Negative   12/17/2022 10:21 AM Negative  Negative   12/17/2022 10:20 AM Negative  Negative   12/17/2022 10:18 AM Positive  Negative   10/22/2022  9:42 AM Negative    Negative    Negative  Negative    Positive    Negative  08/20/2022  9:29 AM Negative    Negative    Negative  Positive    Negative    Negative   06/24/2022  9:43 AM Negative    Positive    Negative  Negative    Negative    Negative   04/17/2022  9:34 AM Negative    Negative    Negative  Negative    Negative    Negative   02/13/2022  9:38 AM Negative    Negative    Negative  Negative    Negative    Negative   10/17/2021 10:22 AM Negative    Negative    Negative  Negative    Negative    Negative     Hepatitis B Lab Results  Component Value Date   HEPBSAB REACTIVE (A) 03/29/2019   HEPBSAG NON-REACTIVE 07/15/2018   HEPBCAB NON-REACTIVE 07/15/2018   Hepatitis C Lab Results  Component Value Date   HEPCAB NON-REACTIVE 07/15/2018   Hepatitis A Lab Results  Component Value Date   HAV REACTIVE (A) 07/15/2018   Lipids: Lab Results  Component Value Date   CHOL 155 08/17/2024   TRIG 49 08/17/2024   HDL 60 08/17/2024   CHOLHDL 2.6 08/17/2024   LDLCALC 81 08/17/2024     Target Date: 16th  Assessment: Ezariah presents today for his maintenance Cabenuva  injections. Past injections were tolerated well without issues. Last HIV RNA was undetectable in September. Doing well with no issues today.  Lab work:  Skip HIV RNA for today and go to Q6 month? Has been undedectable on cabenuva  x years  Eligible vaccinations:  Covid, PCV20, Menveo booster, Shingrix 1/2   Cabenuva : Administered cabotegravir  600mg /82mL in left upper outer quadrant of the gluteal muscle. Administered rilpivirine  900 mg/3mL in the right upper outer quadrant of the gluteal muscle. No issues with injections. Regan will follow up in 2 months for next set of injections.  Plan: - Cabenuva  injections administered - Next injections scheduled for 3/10 with Alan - Call with any issues or questions  Maurilio Patten, PharmD PGY1 Pharmacy Resident [facility] 10/25/2024 9:17 PM    [1] No Known Allergies  "

## 2024-10-26 ENCOUNTER — Ambulatory Visit (INDEPENDENT_AMBULATORY_CARE_PROVIDER_SITE_OTHER): Payer: Self-pay | Admitting: Pharmacist

## 2024-10-26 ENCOUNTER — Other Ambulatory Visit: Payer: Self-pay

## 2024-10-26 DIAGNOSIS — Z21 Asymptomatic human immunodeficiency virus [HIV] infection status: Secondary | ICD-10-CM

## 2024-10-26 DIAGNOSIS — Z113 Encounter for screening for infections with a predominantly sexual mode of transmission: Secondary | ICD-10-CM

## 2024-10-26 MED ORDER — CABOTEGRAVIR & RILPIVIRINE ER 600 & 900 MG/3ML IM SUER
1.0000 | Freq: Once | INTRAMUSCULAR | Status: AC
  Administered 2024-10-26: 1 via INTRAMUSCULAR

## 2024-10-27 NOTE — Progress Notes (Signed)
 SABRA

## 2024-10-28 LAB — CYTOLOGY, (ORAL, ANAL, URETHRAL) ANCILLARY ONLY
Chlamydia: NEGATIVE
Chlamydia: NEGATIVE
Comment: NEGATIVE
Comment: NEGATIVE
Comment: NORMAL
Comment: NORMAL
Neisseria Gonorrhea: NEGATIVE
Neisseria Gonorrhea: NEGATIVE

## 2024-10-28 LAB — URINE CYTOLOGY ANCILLARY ONLY
Chlamydia: NEGATIVE
Comment: NEGATIVE
Comment: NORMAL
Neisseria Gonorrhea: NEGATIVE

## 2024-10-29 LAB — T PALLIDUM AB: T Pallidum Abs: POSITIVE — AB

## 2024-10-29 LAB — SYPHILIS: RPR W/REFLEX TO RPR TITER AND TREPONEMAL ANTIBODIES, TRADITIONAL SCREENING AND DIAGNOSIS ALGORITHM: RPR Ser Ql: REACTIVE — AB

## 2024-10-29 LAB — RPR TITER: RPR Titer: 1:1 {titer} — ABNORMAL HIGH

## 2024-11-10 ENCOUNTER — Other Ambulatory Visit (HOSPITAL_COMMUNITY): Payer: Self-pay

## 2024-12-14 ENCOUNTER — Ambulatory Visit: Payer: Self-pay | Admitting: Pharmacist
